# Patient Record
Sex: Male | Born: 1965 | Race: White | Hispanic: No | Marital: Married | Smoking: Never smoker
Health system: Southern US, Community
[De-identification: ages and names within clinical notes are randomized; demographics above are authoritative.]

## PROBLEM LIST (undated history)

## (undated) DIAGNOSIS — I10 Essential (primary) hypertension: Secondary | ICD-10-CM

## (undated) DIAGNOSIS — M2550 Pain in unspecified joint: Secondary | ICD-10-CM

## (undated) DIAGNOSIS — M7989 Other specified soft tissue disorders: Secondary | ICD-10-CM

## (undated) DIAGNOSIS — A630 Anogenital (venereal) warts: Secondary | ICD-10-CM

## (undated) DIAGNOSIS — I82409 Acute embolism and thrombosis of unspecified deep veins of unspecified lower extremity: Secondary | ICD-10-CM

## (undated) DIAGNOSIS — G4730 Sleep apnea, unspecified: Secondary | ICD-10-CM

## (undated) DIAGNOSIS — F32A Depression, unspecified: Secondary | ICD-10-CM

## (undated) DIAGNOSIS — K5900 Constipation, unspecified: Secondary | ICD-10-CM

## (undated) DIAGNOSIS — K219 Gastro-esophageal reflux disease without esophagitis: Secondary | ICD-10-CM

## (undated) DIAGNOSIS — F909 Attention-deficit hyperactivity disorder, unspecified type: Secondary | ICD-10-CM

## (undated) DIAGNOSIS — M549 Dorsalgia, unspecified: Secondary | ICD-10-CM

## (undated) HISTORY — DX: Attention-deficit hyperactivity disorder, unspecified type: F90.9

## (undated) HISTORY — DX: Anogenital (venereal) warts: A63.0

## (undated) HISTORY — DX: Essential (primary) hypertension: I10

## (undated) HISTORY — DX: Acute embolism and thrombosis of unspecified deep veins of unspecified lower extremity: I82.409

## (undated) HISTORY — DX: Dorsalgia, unspecified: M54.9

## (undated) HISTORY — DX: Depression, unspecified: F32.A

## (undated) HISTORY — DX: Other specified soft tissue disorders: M79.89

## (undated) HISTORY — DX: Pain in unspecified joint: M25.50

## (undated) HISTORY — DX: Sleep apnea, unspecified: G47.30

## (undated) HISTORY — PX: WISDOM TOOTH EXTRACTION: SHX21

## (undated) HISTORY — DX: Constipation, unspecified: K59.00

## (undated) HISTORY — PX: TONSILLECTOMY: SUR1361

---

## 2000-08-06 ENCOUNTER — Emergency Department (HOSPITAL_COMMUNITY): Admission: EM | Admit: 2000-08-06 | Discharge: 2000-08-06 | Payer: Self-pay | Admitting: Emergency Medicine

## 2014-06-15 ENCOUNTER — Emergency Department (INDEPENDENT_AMBULATORY_CARE_PROVIDER_SITE_OTHER)
Admission: EM | Admit: 2014-06-15 | Discharge: 2014-06-15 | Disposition: A | Discharge status: Home / Self Care | Payer: BC Managed Care – PPO | Source: Home / Self Care | Attending: Emergency Medicine | Admitting: Emergency Medicine

## 2014-06-15 ENCOUNTER — Encounter (HOSPITAL_COMMUNITY): Payer: Self-pay | Admitting: Emergency Medicine

## 2014-06-15 DIAGNOSIS — R0789 Other chest pain: Secondary | ICD-10-CM | POA: Diagnosis not present

## 2014-06-15 DIAGNOSIS — F419 Anxiety disorder, unspecified: Secondary | ICD-10-CM | POA: Diagnosis not present

## 2014-06-15 NOTE — ED Notes (Signed)
Pt states that he started having chest pain this evening walking in the park

## 2014-06-15 NOTE — ED Provider Notes (Signed)
CSN: [id number]     Arrival date & time 06/15/14  1930 History   None    Chief Complaint  Patient presents with  . Chest Pain   (Consider location/radiation/quality/duration/timing/severity/associated sxs/prior Treatment) HPI  He is a 49 year old man here for evaluation of chest pain. He states for the last 3 or 4 days he has had a slight pain in the anterior left chest wall. Today, the pain got a little bit worse as he and his partner were driving to the park. He got a little dizzy with it. He denies any diaphoresis, left arm pain, nausea. He had his has been stopped at the drug store and they bought some baby aspirin. He took 4 baby aspirin and they came here. His pain currently is at a 0-1. He reports some continued mild intermittent dizziness. He states his blood pressure was elevated at the drug store. He states he has been monitoring his blood pressure and before today, it was slowly creeping up but the highest it had been was in the 130s over 90s. He does eat a lot of salt. He does not exercise. Diabetes runs in his family, but he does not have a diagnosis of diabetes. No known history of cardiac disease. He is a nonsmoker. No alcohol use. He states he has a lot of stress at work currently.  No past medical history on file. No past surgical history on file. No family history on file. History  Substance Use Topics  . Smoking status: Not on file  . Smokeless tobacco: Not on file  . Alcohol Use: Not on file    Review of Systems As in history of present illness Allergies  Review of patient's allergies indicates not on file.  Home Medications   Prior to Admission medications   Not on File   BP 152/104 mmHg  Pulse 96  Temp(Src) 98.5 F (36.9 C) (Oral)  Resp 15  SpO2 99% Physical Exam  Constitutional: He is oriented to person, place, and time. He appears well-developed and well-nourished. No distress.  Neck: Neck supple.  Cardiovascular: Normal rate, regular rhythm and normal  heart sounds.   No murmur heard. Pulmonary/Chest: Effort normal and breath sounds normal. No respiratory distress. He has no wheezes. He has no rales. He exhibits tenderness.    Neurological: He is alert and oriented to person, place, and time.    ED Course  Procedures (including critical care time) ED ECG REPORT   Date: 06/15/2014  Rate: 95  Rhythm: normal sinus rhythm  QRS Axis: normal  Intervals: normal  ST/T Wave abnormalities: normal  Conduction Disutrbances:none  Narrative Interpretation: normal ekg  Old EKG Reviewed: none available  I have personally reviewed the EKG tracing and agree with the computerized printout as noted.  Labs Review Labs Reviewed - No data to display  Imaging Review No results found.   MDM   1. Chest wall pain   2. Anxiety    He has chest wall pain and some anxiety. Recommended ibuprofen or aspirin as needed for the chest wall pain. Recommended he decrease his salt intake and start exercising. Warning signs reviewed.    Melony Overly, MD 06/15/14 2051

## 2014-06-15 NOTE — Discharge Instructions (Signed)
You have some chest wall pain. The other symptoms he experienced are likely secondary to some anxiety and stress. Your EKG is normal. I am not concerned about a heart attack. Please work on your salt intake. Gradually start adding exercise to your daily routine. Please establish care with a primary care physician. If you develop crushing chest pain, pain going into your left arm, pain associated with nausea or sweatiness, please go to the emergency room.

## 2014-09-11 ENCOUNTER — Encounter: Payer: Self-pay | Admitting: Family Medicine

## 2014-09-11 ENCOUNTER — Ambulatory Visit (INDEPENDENT_AMBULATORY_CARE_PROVIDER_SITE_OTHER): Payer: BC Managed Care – PPO | Admitting: Family Medicine

## 2014-09-11 VITALS — BP 141/104 | HR 84 | Ht 72.5 in | Wt 232.0 lb

## 2014-09-11 DIAGNOSIS — F418 Other specified anxiety disorders: Secondary | ICD-10-CM

## 2014-09-11 DIAGNOSIS — J329 Chronic sinusitis, unspecified: Secondary | ICD-10-CM

## 2014-09-11 DIAGNOSIS — I10 Essential (primary) hypertension: Secondary | ICD-10-CM | POA: Insufficient documentation

## 2014-09-11 DIAGNOSIS — K219 Gastro-esophageal reflux disease without esophagitis: Secondary | ICD-10-CM | POA: Insufficient documentation

## 2014-09-11 DIAGNOSIS — J320 Chronic maxillary sinusitis: Secondary | ICD-10-CM | POA: Diagnosis not present

## 2014-09-11 DIAGNOSIS — R0683 Snoring: Secondary | ICD-10-CM | POA: Diagnosis not present

## 2014-09-11 HISTORY — DX: Chronic sinusitis, unspecified: J32.9

## 2014-09-11 HISTORY — DX: Essential (primary) hypertension: I10

## 2014-09-11 HISTORY — DX: Other specified anxiety disorders: F41.8

## 2014-09-11 HISTORY — DX: Snoring: R06.83

## 2014-09-11 LAB — COMPLETE METABOLIC PANEL WITH GFR
ALT: 15 U/L (ref 0–53)
AST: 13 U/L (ref 0–37)
Albumin: 4.1 g/dL (ref 3.5–5.2)
Alkaline Phosphatase: 78 U/L (ref 39–117)
BUN: 15 mg/dL (ref 6–23)
CALCIUM: 9.6 mg/dL (ref 8.4–10.5)
CO2: 28 meq/L (ref 19–32)
Chloride: 105 mEq/L (ref 96–112)
Creat: 1.02 mg/dL (ref 0.50–1.35)
GFR, Est Non African American: 87 mL/min
Glucose, Bld: 91 mg/dL (ref 70–99)
Potassium: 4.2 mEq/L (ref 3.5–5.3)
SODIUM: 143 meq/L (ref 135–145)
TOTAL PROTEIN: 6.8 g/dL (ref 6.0–8.3)
Total Bilirubin: 0.5 mg/dL (ref 0.2–1.2)

## 2014-09-11 LAB — CBC
HCT: 49.3 % (ref 39.0–52.0)
HEMOGLOBIN: 16.8 g/dL (ref 13.0–17.0)
MCH: 29.8 pg (ref 26.0–34.0)
MCHC: 34.1 g/dL (ref 30.0–36.0)
MCV: 87.4 fL (ref 78.0–100.0)
MPV: 11 fL (ref 8.6–12.4)
Platelets: 219 10*3/uL (ref 150–400)
RBC: 5.64 MIL/uL (ref 4.22–5.81)
RDW: 13.2 % (ref 11.5–15.5)
WBC: 5.8 10*3/uL (ref 4.0–10.5)

## 2014-09-11 LAB — LDL CHOLESTEROL, DIRECT: Direct LDL: 98 mg/dL

## 2014-09-11 MED ORDER — LISINOPRIL 5 MG PO TABS: 5.0000 mg | ORAL_TABLET | Freq: Every day | ORAL | Status: DC

## 2014-09-11 NOTE — Assessment & Plan Note (Signed)
Sleep study pending 

## 2014-09-11 NOTE — Assessment & Plan Note (Signed)
Trial of Rhinocort or Nasonex

## 2014-09-11 NOTE — Progress Notes (Signed)
Brendan Neal is a 49 y.o. male who presents to [facility]  today for establish care. Patient has several active issues to discuss.  1) elevated blood pressure. Patient has been checking his blood pressure at the [facility] the last several months and notes it's consistently elevated. He notes that he has gained weight recently and suspects this is the root cause. He does not take any medications for blood pressure. No chest pains palpitations or shortness of breath.  2) anxiety and depression: Patient notes lots of stress at work. He notes recently he has been feeling a little worse. In the past to receive counseling which was helpful. He denies any SI or HI. He does not want to take medications at this time.  3) nasal congestion: Patient notes chronic nasal congestion and irritation. This presents itself as episodes of sinusitis. He currently feels well. In the past he has used Flonase nasal spray which did not help much.  4) possible sleep apnea: Patient has snoring and difficulty breathing at night sometimes. His husband notes that he stops breathing for 15 seconds sometimes at night. He does not feel well rested in the morning. He denies any chest pains or shortness of breath.  5) acid reflux:  Patient notes heartburn and irritation. He takes times daily for this problem. He had an endoscopy in 2014 and was diagnosed with sounds like pill esophagitis. He denies any trouble swallowing weight loss or night sweats.   Past Medical History  Diagnosis Date  . Genital warts   . Hypertension    Past Surgical History  Procedure Laterality Date  . Tonsillectomy    . Wisdom tooth extraction     History  Substance Use Topics  . Smoking status: Never Smoker   . Smokeless tobacco: Not on file  . Alcohol Use: No   ROS as above Medications: Current Outpatient Prescriptions  Medication Sig Dispense Refill  . aspirin-acetaminophen-caffeine (EXCEDRIN MIGRAINE)  250-250-65 MG per tablet Take by mouth every 6 (six) hours as needed for headache.    . loratadine (CLARITIN) 10 MG tablet Take 10 mg by mouth daily.    Marland Kitchen lisinopril (PRINIVIL,ZESTRIL) 5 MG tablet Take 1 tablet (5 mg total) by mouth daily. 90 tablet 3   No current facility-administered medications for this visit.   No Known Allergies   Exam:  BP 141/104 mmHg  Pulse 84  Ht 6' 0.5" (1.842 m)  Wt 232 lb (105.235 kg)  BMI 31.02 kg/m2 Gen: Well NAD HEENT: EOMI,  MMM large neck. Normal posterior pharynx. Inflamed nasal turbinates bilaterally. Lungs: Normal work of breathing. CTABL Heart: RRR no MRG Abd: NABS, Soft. Nondistended, Nontender Exts: Brisk capillary refill, warm and well perfused.   No results found for this or any previous visit (from the past 24 hour(s)). No results found.   Please see individual assessment and plan sections.

## 2014-09-11 NOTE — Assessment & Plan Note (Addendum)
Will not start medicines yet. We'll check for counseling.

## 2014-09-11 NOTE — Patient Instructions (Signed)
Thank you for coming in today. Take lisinopril daily.  Return in 2 weeks for nurse visit and lab recheck.  Try Rhinocort or nasonex.  Go for sleep study.  Return for foot and ankle care.   Hypertension Hypertension, commonly called high blood pressure, is when the force of blood pumping through your arteries is too strong. Your arteries are the blood vessels that carry blood from your heart throughout your body. A blood pressure reading consists of a higher number over a lower number, such as 110/72. The higher number (systolic) is the pressure inside your arteries when your heart pumps. The lower number (diastolic) is the pressure inside your arteries when your heart relaxes. Ideally you want your blood pressure below 120/80. Hypertension forces your heart to work harder to pump blood. Your arteries may become narrow or stiff. Having hypertension puts you at risk for heart disease, stroke, and other problems.  RISK FACTORS Some risk factors for high blood pressure are controllable. Others are not.  Risk factors you cannot control include:   Race. You may be at higher risk if you are African American.  Age. Risk increases with age.  Gender. Men are at higher risk than women before age 34 years. After age 7, women are at higher risk than men. Risk factors you can control include:  Not getting enough exercise or physical activity.  Being overweight.  Getting too much fat, sugar, calories, or salt in your diet.  Drinking too much alcohol. SIGNS AND SYMPTOMS Hypertension does not usually cause signs or symptoms. Extremely high blood pressure (hypertensive crisis) may cause headache, anxiety, shortness of breath, and nosebleed. DIAGNOSIS  To check if you have hypertension, your health care provider will measure your blood pressure while you are seated, with your arm held at the level of your heart. It should be measured at least twice using the same arm. Certain conditions can cause a  difference in blood pressure between your right and left arms. A blood pressure reading that is higher than normal on one occasion does not mean that you need treatment. If one blood pressure reading is high, ask your health care provider about having it checked again. TREATMENT  Treating high blood pressure includes making lifestyle changes and possibly taking medicine. Living a healthy lifestyle can help lower high blood pressure. You may need to change some of your habits. Lifestyle changes may include:  Following the DASH diet. This diet is high in fruits, vegetables, and whole grains. It is low in salt, red meat, and added sugars.  Getting at least 2 hours of brisk physical activity every week.  Losing weight if necessary.  Not smoking.  Limiting alcoholic beverages.  Learning ways to reduce stress. If lifestyle changes are not enough to get your blood pressure under control, your health care provider may prescribe medicine. You may need to take more than one. Work closely with your health care provider to understand the risks and benefits. HOME CARE INSTRUCTIONS  Have your blood pressure rechecked as directed by your health care provider.   Take medicines only as directed by your health care provider. Follow the directions carefully. Blood pressure medicines must be taken as prescribed. The medicine does not work as well when you skip doses. Skipping doses also puts you at risk for problems.   Do not smoke.   Monitor your blood pressure at home as directed by your health care provider. SEEK MEDICAL CARE IF:   You think you are having a  reaction to medicines taken.  You have recurrent headaches or feel dizzy.  You have swelling in your ankles.  You have trouble with your vision. SEEK IMMEDIATE MEDICAL CARE IF:  You develop a severe headache or confusion.  You have unusual weakness, numbness, or feel faint.  You have severe chest or abdominal pain.  You vomit  repeatedly.  You have trouble breathing. MAKE SURE YOU:   Understand these instructions.  Will watch your condition.  Will get help right away if you are not doing well or get worse. Document Released: 02/06/2005 Document Revised: 06/23/2013 Document Reviewed: 11/29/2012 [facility] Patient Information 2015 [place], [place]. This information is not intended to replace advice given to you by your health care provider. Make sure you discuss any questions you have with your health care provider.

## 2014-09-11 NOTE — Assessment & Plan Note (Signed)
Hypertension. Start low-dose lisinopril. Check basic labs today. Sleep study for evaluation of sleep apnea

## 2014-09-11 NOTE — Assessment & Plan Note (Signed)
Patient does not want to take PPIs at this time. We'll attempt to get forms from gastroenterologist.

## 2014-09-13 NOTE — Progress Notes (Signed)
Quick Note:  Normal, no changes. ______ 

## 2014-09-15 ENCOUNTER — Telehealth: Payer: Self-pay | Admitting: Family Medicine

## 2014-09-15 NOTE — Telephone Encounter (Signed)
I called patient with a recommendation for counseling. I [facility]. I left a message. Patient will call back if he has any questions or needs a referral.

## 2014-10-02 ENCOUNTER — Ambulatory Visit (INDEPENDENT_AMBULATORY_CARE_PROVIDER_SITE_OTHER): Payer: BC Managed Care – PPO | Admitting: Family Medicine

## 2014-10-02 ENCOUNTER — Ambulatory Visit: Payer: Self-pay | Admitting: Family Medicine

## 2014-10-02 ENCOUNTER — Ambulatory Visit (INDEPENDENT_AMBULATORY_CARE_PROVIDER_SITE_OTHER): Payer: BC Managed Care – PPO

## 2014-10-02 ENCOUNTER — Telehealth: Payer: Self-pay

## 2014-10-02 ENCOUNTER — Encounter: Payer: Self-pay | Admitting: Family Medicine

## 2014-10-02 VITALS — BP 120/83 | HR 69 | Wt 227.0 lb

## 2014-10-02 DIAGNOSIS — R05 Cough: Secondary | ICD-10-CM | POA: Diagnosis not present

## 2014-10-02 DIAGNOSIS — J069 Acute upper respiratory infection, unspecified: Secondary | ICD-10-CM | POA: Diagnosis not present

## 2014-10-02 DIAGNOSIS — R0602 Shortness of breath: Secondary | ICD-10-CM

## 2014-10-02 MED ORDER — IPRATROPIUM BROMIDE 0.06 % NA SOLN: 2.0000 | Freq: Four times a day (QID) | NASAL | Status: DC

## 2014-10-02 MED ORDER — LEVOFLOXACIN 500 MG PO TABS: 500.0000 mg | ORAL_TABLET | Freq: Every day | ORAL | Status: DC

## 2014-10-02 MED ORDER — GUAIFENESIN-CODEINE 100-10 MG/5ML PO SOLN: 5.0000 mL | Freq: Every evening | ORAL | Status: DC | PRN

## 2014-10-02 MED ORDER — PREDNISONE 10 MG PO TABS: 30.0000 mg | ORAL_TABLET | Freq: Every day | ORAL | Status: DC

## 2014-10-02 NOTE — Assessment & Plan Note (Signed)
Likely viral URI. Patient has mild orthostatic changes as well. Recommend discontinuation of lisinopril for the last few days. Treat symptoms with codeine cough syrup, Atrovent nasal spray and prednisone as needed. Chest x-ray pending.

## 2014-10-02 NOTE — Progress Notes (Signed)
Quick Note:  CXR show PNA. I called in [place]. Take that instead of prednisone. Return as needed. ______

## 2014-10-02 NOTE — Progress Notes (Signed)
Brendan Neal is a 49 y.o. male who presents to [facility]  today for sneezing, postnasal drip dizziness. Symptoms present for 3 days. Patient also notes mild fatigue and body aches. He has tried some over-the-counter cough medicines and some aspirin which help a little. He feels lightheaded when he stands up. He denies any chest pains or palpitations. He denies any syncope. He feels well otherwise.   Past Medical History  Diagnosis Date  . Genital warts   . Hypertension    Past Surgical History  Procedure Laterality Date  . Tonsillectomy    . Wisdom tooth extraction     Social History  Substance Use Topics  . Smoking status: Never Smoker   . Smokeless tobacco: Not on file  . Alcohol Use: No   ROS as above Medications: Current Outpatient Prescriptions  Medication Sig Dispense Refill  . aspirin-acetaminophen-caffeine (EXCEDRIN MIGRAINE) 250-250-65 MG per tablet Take by mouth every 6 (six) hours as needed for headache.    . lisinopril (PRINIVIL,ZESTRIL) 5 MG tablet Take 1 tablet (5 mg total) by mouth daily. 90 tablet 3  . loratadine (CLARITIN) 10 MG tablet Take 10 mg by mouth daily.    Marland Kitchen guaiFENesin-codeine 100-10 MG/5ML syrup Take 5 mLs by mouth at bedtime as needed for cough. 120 mL 0  . ipratropium (ATROVENT) 0.06 % nasal spray Place 2 sprays into both nostrils 4 (four) times daily. 15 mL 1  . predniSONE (DELTASONE) 10 MG tablet Take 3 tablets (30 mg total) by mouth daily. 15 tablet 0   No current facility-administered medications for this visit.   No Known Allergies   Exam:  BP 120/83 mmHg  Pulse 69  Wt 227 lb (102.967 kg)  Orthostatic VS for the past 24 hrs:  BP- Lying Pulse- Lying BP- Sitting Pulse- Sitting BP- Standing at 0 minutes Pulse- Standing at 0 minutes  10/02/14 0911 120/83 mmHg 69 (!) 127/93 mmHg 91 125/86 mmHg 93      Gen: Well NAD HEENT: EOMI,  MMM posterior pharynx with cobblestoning. Clear nasal discharge Lungs:  Normal work of breathing. CTABL Heart: RRR no MRG Abd: NABS, Soft. Nondistended, Nontender Exts: Brisk capillary refill, warm and well perfused.   No results found for this or any previous visit (from the past 24 hour(s)). No results found.   Please see individual assessment and plan sections.

## 2014-10-02 NOTE — Addendum Note (Signed)
Addended by: Gregor Hams on: 10/02/2014 12:41 PM   Modules accepted: Orders

## 2014-10-02 NOTE — Patient Instructions (Signed)
Thank you for coming in today. STOP the lisinopril for a few days. Drink plenty of fluids.  Take 1000mg  acetaminophen (Tylenol) every 6 hours as needed for pain fatigue and malaise. Use codeine cough syrup at bedtime as needed for cough. Use Atrovent nasal spray every 4-6 hours as needed for runny nose and postnasal drip. Take prednisone in a day or 2 if not getting better.  Call or go to the emergency room if you get worse, have trouble breathing, have chest pains, or palpitations.   Return in a few weeks for repeat lab draw. This can be just a arranged lab visit. Do not need to see me.  Acute Bronchitis Bronchitis is inflammation of the airways that extend from the windpipe into the lungs (bronchi). The inflammation often causes mucus to develop. This leads to a cough, which is the most common symptom of bronchitis.  In acute bronchitis, the condition usually develops suddenly and goes away over time, usually in a couple weeks. Smoking, allergies, and asthma can make bronchitis worse. Repeated episodes of bronchitis may cause further lung problems.  CAUSES Acute bronchitis is most often caused by the same virus that causes a cold. The virus can spread from person to person (contagious) through coughing, sneezing, and touching contaminated objects. SIGNS AND SYMPTOMS   Cough.   Fever.   Coughing up mucus.   Body aches.   Chest congestion.   Chills.   Shortness of breath.   Sore throat.  DIAGNOSIS  Acute bronchitis is usually diagnosed through a physical exam. Your health care provider will also ask you questions about your medical history. Tests, such as chest X-rays, are sometimes done to rule out other conditions.  TREATMENT  Acute bronchitis usually goes away in a couple weeks. Oftentimes, no medical treatment is necessary. Medicines are sometimes given for relief of fever or cough. Antibiotic medicines are usually not needed but may be prescribed in certain  situations. In some cases, an inhaler may be recommended to help reduce shortness of breath and control the cough. A cool mist vaporizer may also be used to help thin bronchial secretions and make it easier to clear the chest.  HOME CARE INSTRUCTIONS  Get plenty of rest.   Drink enough fluids to keep your urine clear or pale yellow (unless you have a medical condition that requires fluid restriction). Increasing fluids may help thin your respiratory secretions (sputum) and reduce chest congestion, and it will prevent dehydration.   Take medicines only as directed by your health care provider.  If you were prescribed an antibiotic medicine, finish it all even if you start to feel better.  Avoid smoking and secondhand smoke. Exposure to cigarette smoke or irritating chemicals will make bronchitis worse. If you are a smoker, consider using nicotine gum or skin patches to help control withdrawal symptoms. Quitting smoking will help your lungs heal faster.   Reduce the chances of another bout of acute bronchitis by washing your hands frequently, avoiding people with cold symptoms, and trying not to touch your hands to your mouth, nose, or eyes.   Keep all follow-up visits as directed by your health care provider.  SEEK MEDICAL CARE IF: Your symptoms do not improve after 1 week of treatment.  SEEK IMMEDIATE MEDICAL CARE IF:  You develop an increased fever or chills.   You have chest pain.   You have severe shortness of breath.  You have bloody sputum.   You develop dehydration.  You faint or repeatedly feel  like you are going to pass out.  You develop repeated vomiting.  You develop a severe headache. MAKE SURE YOU:   Understand these instructions.  Will watch your condition.  Will get help right away if you are not doing well or get worse. Document Released: 03/16/2004 Document Revised: 06/23/2013 Document Reviewed: 07/30/2012 [facility] Patient Information 2015  [place], [place]. This information is not intended to replace advice given to you by your health care provider. Make sure you discuss any questions you have with your health care provider.

## 2014-10-02 NOTE — Telephone Encounter (Signed)
Pt called and is very concerned about having PNA. He want to know how serious this is? Is it bilateral? How long he should be out of work? Please advise.

## 2014-10-05 NOTE — Telephone Encounter (Signed)
The ABX we picked should work well. The pneumonia is small. He should be fine. Out of work a few days. Return when feeling better. This should be early this week.

## 2014-10-05 NOTE — Telephone Encounter (Signed)
Pt notified of recommendations

## 2014-10-09 ENCOUNTER — Telehealth: Payer: Self-pay | Admitting: Family Medicine

## 2014-10-09 NOTE — Telephone Encounter (Signed)
Patient called stating that he continues to have a cough. He's feeling much better however. I reassured him that is normal to have a postviral cough. Restart lisinopril. Return if not better.

## 2014-10-14 ENCOUNTER — Encounter: Payer: Self-pay | Admitting: Family Medicine

## 2014-10-14 ENCOUNTER — Ambulatory Visit (INDEPENDENT_AMBULATORY_CARE_PROVIDER_SITE_OTHER): Payer: BC Managed Care – PPO | Admitting: Family Medicine

## 2014-10-14 VITALS — BP 127/92 | HR 96 | Temp 98.2°F | Wt 231.0 lb

## 2014-10-14 DIAGNOSIS — R058 Other specified cough: Secondary | ICD-10-CM | POA: Insufficient documentation

## 2014-10-14 DIAGNOSIS — R05 Cough: Secondary | ICD-10-CM

## 2014-10-14 MED ORDER — BENZONATATE 200 MG PO CAPS: 200.0000 mg | ORAL_CAPSULE | Freq: Three times a day (TID) | ORAL | Status: DC | PRN

## 2014-10-14 NOTE — Progress Notes (Signed)
Brendan Neal is a 49 y.o. male who presents to [facility]  today for cough. Patient was recently treated for pneumonia with Levaquin. He notes he is feeling much better but notes a persistent mildly productive cough. Additionally he notes a postnasal drip and congestion. He denies any current fevers chills nausea vomiting or diarrhea. He is concerned that he may be developing pneumonia again.   Past Medical History  Diagnosis Date  . Genital warts   . Hypertension    Past Surgical History  Procedure Laterality Date  . Tonsillectomy    . Wisdom tooth extraction     Social History  Substance Use Topics  . Smoking status: Never Smoker   . Smokeless tobacco: Not on file  . Alcohol Use: No   family history includes Alcohol abuse in his brother; Depression in his brother; Diabetes in his maternal aunt.  ROS as above Medications: Current Outpatient Prescriptions  Medication Sig Dispense Refill  . aspirin-acetaminophen-caffeine (EXCEDRIN MIGRAINE) 250-250-65 MG per tablet Take by mouth every 6 (six) hours as needed for headache.    . GuaiFENesin (MUCINEX PO) Take by mouth.    Marland Kitchen ipratropium (ATROVENT) 0.06 % nasal spray Place 2 sprays into both nostrils 4 (four) times daily. 15 mL 1  . lisinopril (PRINIVIL,ZESTRIL) 5 MG tablet Take 1 tablet (5 mg total) by mouth daily. 90 tablet 3  . loratadine (CLARITIN) 10 MG tablet Take 10 mg by mouth daily.    . benzonatate (TESSALON) 200 MG capsule Take 1 capsule (200 mg total) by mouth 3 (three) times daily as needed for cough. 90 capsule 1  . guaiFENesin-codeine 100-10 MG/5ML syrup Take 5 mLs by mouth at bedtime as needed for cough. (Patient not taking: Reported on 10/14/2014) 120 mL 0   No current facility-administered medications for this visit.   No Known Allergies   Exam:  BP 127/92 mmHg  Pulse 96  Temp(Src) 98.2 F (36.8 C) (Oral)  Wt 231 lb (104.781 kg)  SpO2 96% Gen: Well NAD HEENT: EOMI,  MMM  posterior pharynx with cobblestoning. Normal tympanic membranes bilaterally. Sinuses are nontender to percussion bilaterally. No cervical lymphadenopathy present Lungs: Normal work of breathing. CTABL Heart: RRR no MRG Abd: NABS, Soft. Nondistended, Nontender Exts: Brisk capillary refill, warm and well perfused.   No results found for this or any previous visit (from the past 24 hour(s)). No results found.   Please see individual assessment and plan sections.

## 2014-10-14 NOTE — Patient Instructions (Signed)
Thank you for coming in today. Call or go to the emergency room if you get worse, have trouble breathing, have chest pains, or palpitations.  Use tessalon for cough as needed.  Take it easy if you can.

## 2014-10-14 NOTE — Assessment & Plan Note (Signed)
Symptoms most consistent with post viral or post pneumonia cough. I explained this is a normal finding and will use Tessalon Perles for symptom control as needed. Additionally we'll treat sinus drainage with any histamines and Atrovent nasal spray. Return if not improving or worsening.

## 2014-12-08 ENCOUNTER — Ambulatory Visit (HOSPITAL_BASED_OUTPATIENT_CLINIC_OR_DEPARTMENT_OTHER): Payer: BC Managed Care – PPO | Attending: Family Medicine

## 2015-02-17 ENCOUNTER — Ambulatory Visit (INDEPENDENT_AMBULATORY_CARE_PROVIDER_SITE_OTHER): Payer: BC Managed Care – PPO | Admitting: Family Medicine

## 2015-02-17 ENCOUNTER — Encounter: Payer: Self-pay | Admitting: Family Medicine

## 2015-02-17 VITALS — BP 130/86 | HR 97 | Wt 232.0 lb

## 2015-02-17 DIAGNOSIS — F411 Generalized anxiety disorder: Secondary | ICD-10-CM | POA: Diagnosis not present

## 2015-02-17 DIAGNOSIS — I10 Essential (primary) hypertension: Secondary | ICD-10-CM

## 2015-02-17 DIAGNOSIS — F418 Other specified anxiety disorders: Secondary | ICD-10-CM | POA: Diagnosis not present

## 2015-02-17 DIAGNOSIS — K219 Gastro-esophageal reflux disease without esophagitis: Secondary | ICD-10-CM

## 2015-02-17 HISTORY — DX: Generalized anxiety disorder: F41.1

## 2015-02-17 MED ORDER — RANITIDINE HCL 300 MG PO TABS: 300.0000 mg | ORAL_TABLET | Freq: Two times a day (BID) | ORAL | Status: DC

## 2015-02-17 MED ORDER — HYDROCHLOROTHIAZIDE 12.5 MG PO TABS: 12.5000 mg | ORAL_TABLET | Freq: Every day | ORAL | Status: DC

## 2015-02-17 NOTE — Assessment & Plan Note (Signed)
Chronic, worsening. Tums not helping. Failed PPI treatment in past due to side effects. Will prescribe ranitidine 300 per patient request for different class of medications.

## 2015-02-17 NOTE — Assessment & Plan Note (Signed)
Mild given GAD7. Will refer to CBT. Will defer medication at this time per patient request.

## 2015-02-17 NOTE — Progress Notes (Signed)
       Brendan Neal is a 49 y.o. male who presents to [facility] today for concerns about BP medications, reflux, and anxiety.  1) Anxiety: Patient is under a tremendous amount of stress at work. He endorses blurry vision, dizziness and feelings of being "out of his body", which he has attributed to his medications. He says he feels "crazy and anxious" all the time and has trouble prioritizing tasks at work due to the stress. In the past, he tried CBT with particular success with exercise and medication. At this point, it is interfering with his work and social life and he would like further treatment. He is not interested in medications at this time but would like to return to therapy.   2) GERD: Patient has history of esophageal ulcer with few times weekly burning sensation at night after eating and lying down. He notes tums are not working anymore. He tried prilosec in the past which made him feel drowsy and "woozy" like he was "out of his body". He would like to try a different class of medications.   3) BP medications: Patient endorses drowsiness, continued dry cough and dizziness while taking lisinopril. Patient has measured his blood pressure during the dizziness and it has been in the 140s over 90s range. He would like to change medications and would not like to try an ARB. He would prefer a diuretic.   Past Medical History  Diagnosis Date  . Genital warts   . Hypertension    Past Surgical History  Procedure Laterality Date  . Tonsillectomy    . Wisdom tooth extraction     Social History  Substance Use Topics  . Smoking status: Never Smoker   . Smokeless tobacco: Not on file  . Alcohol Use: No   family history includes Alcohol abuse in his brother; Depression in his brother; Diabetes in his maternal aunt.  ROS as above Medications: Current Outpatient Prescriptions  Medication Sig  Dispense Refill  . Calcium Carbonate Antacid (TUMS PO) Take by mouth.    Neta Ehlers SALINE NASAL SPRAY NA Place into the nose.    . loratadine (CLARITIN) 10 MG tablet Take 10 mg by mouth daily.    . hydrochlorothiazide (HYDRODIURIL) 12.5 MG tablet Take 1 tablet (12.5 mg total) by mouth daily. 30 tablet 0  . ranitidine (ZANTAC) 300 MG tablet Take 1 tablet (300 mg total) by mouth 2 (two) times daily. 180 tablet 3   No current facility-administered medications for this visit.   No Known Allergies   Exam:  BP 130/86 mmHg  Pulse 97  Wt 232 lb (105.235 kg) Gen: Well appearing gentleman in NAD HEENT: EOMI,  MMM Lungs: Normal work of breathing. CTABL no wheezes or crackles  Heart: RRR no MRG Abd: NABS, Soft. Nondistended, Nontender Exts: Brisk capillary refill, warm and well perfused.  Psych: Pressured speech, fidgety, Mood: feels "crazy and anxious" all the time and "sometimes depressed"   No results found for this or any previous visit (from the past 24 hour(s)). No results found.  PHQ9: 4 GAD7: 5  Please see individual assessment and plan sections.

## 2015-02-17 NOTE — Patient Instructions (Signed)
Thank you for coming in today. You were seen today for your hypertension, anxiety, and gastroesophageal reflux disease.  We will change your blood medication to HCTZ. We will prescribe ranitidine for reflux.  Please come back in 1 month for a blood pressure check and bloodwork to assess your kidney function.   [address] [place], [place] [address]  ???PHONE: [phone] FAX: [phone] EMAIL: [email or web address]   Food Choices for Gastroesophageal Reflux Disease, Adult When you have gastroesophageal reflux disease (GERD), the foods you eat and your eating habits are very important. Choosing the right foods can help ease the discomfort of GERD. WHAT GENERAL GUIDELINES DO I NEED TO FOLLOW?  Choose fruits, vegetables, whole grains, low-fat dairy products, and low-fat meat, fish, and poultry.  Limit fats such as oils, salad dressings, butter, nuts, and avocado.  Keep a food diary to identify foods that cause symptoms.  Avoid foods that cause reflux. These may be different for different people.  Eat frequent small meals instead of three large meals each day.  Eat your meals slowly, in a relaxed setting.  Limit fried foods.  Cook foods using methods other than frying.  Avoid drinking alcohol.  Avoid drinking large amounts of liquids with your meals.  Avoid bending over or lying down until 2-3 hours after eating. WHAT FOODS ARE NOT RECOMMENDED? The following are some foods and drinks that may worsen your symptoms: Vegetables Tomatoes. Tomato juice. Tomato and spaghetti sauce. Chili peppers. Onion and garlic. Horseradish. Fruits Oranges, grapefruit, and lemon (fruit and juice). Meats High-fat meats, fish, and poultry. This includes hot dogs, ribs, ham, sausage, salami, and bacon. Dairy Whole milk and chocolate milk. Sour cream. Cream. Butter. Ice cream. Cream cheese.  Beverages Coffee and tea, with or without caffeine. Carbonated beverages or energy  drinks. Condiments Hot sauce. Barbecue sauce.  Sweets/Desserts Chocolate and cocoa. Donuts. Peppermint and spearmint. Fats and Oils High-fat foods, including [place] fries and potato chips. Other Vinegar. Strong spices, such as black pepper, white pepper, red pepper, cayenne, curry powder, cloves, ginger, and chili powder. The items listed above may not be a complete list of foods and beverages to avoid. Contact your dietitian for more information.   This information is not intended to replace advice given to you by your health care provider. Make sure you discuss any questions you have with your health care provider.   Document Released: 02/06/2005 Document Revised: 02/27/2014 Document Reviewed: 12/11/2012 Elsevier Interactive Patient Education [employer].

## 2015-02-17 NOTE — Assessment & Plan Note (Addendum)
Currently well controlled, though not tolerating medication. Will prescribe HCTZ 12.5 mg per patient request for different medication class. Interested in trying Norvasc if he fails this.  Recheck in one month

## 2015-03-19 ENCOUNTER — Other Ambulatory Visit: Payer: Self-pay | Admitting: Family Medicine

## 2015-04-02 ENCOUNTER — Encounter: Payer: Self-pay | Admitting: Family Medicine

## 2015-04-02 ENCOUNTER — Ambulatory Visit (INDEPENDENT_AMBULATORY_CARE_PROVIDER_SITE_OTHER): Payer: BC Managed Care – PPO | Admitting: Family Medicine

## 2015-04-02 VITALS — BP 126/89 | HR 96 | Temp 98.5°F | Wt 235.0 lb

## 2015-04-02 DIAGNOSIS — IMO0001 Reserved for inherently not codable concepts without codable children: Secondary | ICD-10-CM

## 2015-04-02 DIAGNOSIS — R110 Nausea: Secondary | ICD-10-CM | POA: Diagnosis not present

## 2015-04-02 DIAGNOSIS — G43009 Migraine without aura, not intractable, without status migrainosus: Secondary | ICD-10-CM

## 2015-04-02 DIAGNOSIS — R030 Elevated blood-pressure reading, without diagnosis of hypertension: Secondary | ICD-10-CM | POA: Diagnosis not present

## 2015-04-02 MED ORDER — PROMETHAZINE HCL 25 MG/ML IJ SOLN
25.0000 mg | Freq: Once | INTRAMUSCULAR | Status: AC
  Administered 2015-04-02: 25 mg via INTRAMUSCULAR

## 2015-04-02 MED ORDER — RIZATRIPTAN BENZOATE 5 MG PO TBDP: 5.0000 mg | ORAL_TABLET | ORAL | Status: DC | PRN

## 2015-04-02 MED ORDER — KETOROLAC TROMETHAMINE 60 MG/2ML IM SOLN
60.0000 mg | Freq: Once | INTRAMUSCULAR | Status: AC
  Administered 2015-04-02: 60 mg via INTRAMUSCULAR

## 2015-04-02 NOTE — Patient Instructions (Signed)

## 2015-04-02 NOTE — Progress Notes (Signed)
Subjective:    Patient ID: Brendan Neal, male    DOB: 1965/07/31, 50 y.o.   MRN: [id number]  HPI pt reports that when he got up this morning he has felt off balance he reports a lot of pressure behind his L eye he feels tired, sluggish he vomited 1 x today took. He has had this similar pain before around the eyes and temple area. He came in today because when he went home he checked his blood pressure it was in the 150's/115.  He had taken his blood pressure pill this morning and actually took a second dose later when his blood pressure was high. He reports his bp meds have recently been changed from lisinopril to hctz due to the lisinopril causing dizziness, about a month ago.   He feels nauseated with the pressure behind his eye. Says the pressure is worse when he bends over and throbs.  He reports that he gets similar headaches when the barometric pressure changes..  Had to leave work early because of the pain. He says sometimes these headaches are severe enough that he does have to leave work. He rates his pain as 7 or 8 out of 10 today. He does vomit with the head pressure occ. He took 20mg  of claritin.  Pain can last 4 hour to 3 days. He denies any light or sound sensitivity. He denies any aura or prewarning symptoms for these types of headaches. He tends to get these headaches every 2-3 months.   Review of Systems     Objective:   Physical Exam  Constitutional: He is oriented to person, place, and time. He appears well-developed and well-nourished.  HENT:  Head: Normocephalic and atraumatic.  Right Ear: External ear normal.  Left Ear: External ear normal.  Nose: Nose normal.  Mouth/Throat: Oropharynx is clear and moist.  TMs and canals are clear.   Eyes: Conjunctivae and EOM are normal. Pupils are equal, round, and reactive to light.  Neck: Normal range of motion. Neck supple. No thyromegaly present.  Cardiovascular: Normal rate, regular rhythm, normal heart sounds and intact distal  pulses.   Pulmonary/Chest: Effort normal and breath sounds normal.  Musculoskeletal: Normal range of motion.  Lymphadenopathy:    He has no cervical adenopathy.  Neurological: He is alert and oriented to person, place, and time. He has normal reflexes.  Skin: Skin is warm and dry.  Psychiatric: He has a normal mood and affect. His behavior is normal. Judgment and thought content normal.          Assessment & Plan:  Migraine headache without aura - based on his description of recurrent headaches that last hours to days and typically are unilateral, there occasionally bilateral and more severe that he has to leave work accompanied with occasional nausea and vomiting he meets criteria for migraine headaches. I discussed that for some people weather changes can be a major trigger. I did give him a handout today with some other additional information including a list of potential food triggers etc. We discussed acute treatment with Toradol and Phenergan injection here in the office. He did start get some relief before he left. We also discussed a trial of Maxalt melt in case he starts to get another headache. I did encourage him to follow-up with Dr. Georgina Snell in a couple months to let him know how he's doing and so they can adjust the medication if needed.  Elevated blood pressure-after he laid down for a few minutes after the  Toradol and Phenergan injection and started to get some pain relief his blood pressure came down beautifully. He reports that his blood pressures have been in the low 130s on the HCTZ at home over the last month. Again encouraged him to follow-up with Dr. Georgina Snell in a couple months just to adjust the medication if needed but right now it looks great.

## 2015-04-17 ENCOUNTER — Other Ambulatory Visit: Payer: Self-pay | Admitting: Family Medicine

## 2015-05-14 ENCOUNTER — Other Ambulatory Visit: Payer: Self-pay | Admitting: Family Medicine

## 2015-06-14 ENCOUNTER — Other Ambulatory Visit: Payer: Self-pay | Admitting: Family Medicine

## 2015-07-02 ENCOUNTER — Ambulatory Visit (INDEPENDENT_AMBULATORY_CARE_PROVIDER_SITE_OTHER): Payer: BC Managed Care – PPO | Admitting: Sports Medicine

## 2015-07-02 ENCOUNTER — Encounter: Payer: Self-pay | Admitting: Sports Medicine

## 2015-07-02 VITALS — BP 130/92 | HR 101 | Temp 98.0°F | Resp 18 | Wt 239.2 lb

## 2015-07-02 DIAGNOSIS — L219 Seborrheic dermatitis, unspecified: Secondary | ICD-10-CM | POA: Diagnosis not present

## 2015-07-02 DIAGNOSIS — J320 Chronic maxillary sinusitis: Secondary | ICD-10-CM

## 2015-07-02 HISTORY — DX: Seborrheic dermatitis, unspecified: L21.9

## 2015-07-02 MED ORDER — AZITHROMYCIN 250 MG PO TABS: ORAL_TABLET | ORAL | Status: DC

## 2015-07-02 MED ORDER — TRIAMCINOLONE ACETONIDE 0.5 % EX CREA: 1.0000 "application " | TOPICAL_CREAM | Freq: Two times a day (BID) | CUTANEOUS | Status: DC

## 2015-07-02 NOTE — Assessment & Plan Note (Signed)
Left nasolabial fold, triamcinolone cream

## 2015-07-02 NOTE — Assessment & Plan Note (Signed)
Recurrence of acute maxillary sinusitis, azithromycin, Flonase.

## 2015-07-02 NOTE — Patient Instructions (Signed)
Seborrheic Dermatitis Seborrheic dermatitis involves pink or red skin with greasy, flaky scales. It usually occurs on the scalp, and it is often called dandruff. This condition may also affect the eyebrows, nose, ears, chest, and the bearded area of men's faces. It often occurs where skin has more oil (sebaceous) glands. It may come and go for no known reason, and it is often long-lasting (chronic). CAUSES The cause is not known. RISK FACTORS This condition is more like to develop in:  People who are stressed or tired.  People who have skin conditions, such as acne.  People who have certain conditions, such as:  HIV (human immunodeficiency virus).  AIDS (acquired immunodeficiency syndrome).  Parkinson disease.  An eating disorder.  Stroke.  Depression.  Epilepsy.  Alcoholism.  People who live in places that have extreme weather.  People who have a family history of seborrheic dermatitis.  People who use skin creams that are made with alcohol.  People who are 30-60 years old.  People who take certain medicines. SYMPTOMS Symptoms of this condition include:  Thick scales on the scalp.  Redness on the face or in the armpits.  Skin that is flaky. The flakes may be white or yellow.  Skin that seems oily or dry but is not helped with moisturizers.  Itching or burning in the affected areas. DIAGNOSIS This condition is diagnosed with a medical history and physical exam. A sample of your skin may be tested (skin biopsy). You may need to see a skin specialist (dermatologist). TREATMENT There is no cure for this condition, but treatment can help to manage the symptoms. Treatment may include:  Cortisone (steroid) ointments, creams, and lotions.  Over-the-counter or prescription shampoos. HOME CARE INSTRUCTIONS  Apply over-the-counter and prescription medicines only as told by your health care provider.  Keep all follow-up visits as told by your health care provider.  This is important.  Try to reduce your stress, such as with yoga or mediation. If you need help to reduce stress, ask your health care provider.  Shower or bathe as told by your health care provider.  Use any medicated shampoos as told by your health care provider. SEEK MEDICAL CARE IF:  Your symptoms do not improve with treatment.  Your symptoms get worse.  You have new symptoms.   This information is not intended to replace advice given to you by your health care provider. Make sure you discuss any questions you have with your health care provider.   Document Released: 02/06/2005 Document Revised: 10/28/2014 Document Reviewed: 06/24/2014 Elsevier Interactive Patient Education 2016 Elsevier Inc.  

## 2015-07-02 NOTE — Progress Notes (Signed)
  Subjective:    CC: Feeling sick  HPI: For over 1 week his pleasant 50 year old male has had increasing pain and pressure over his sinuses, sore throat, mild cough, more recently it is progressed to yellowish nasal discharge with severe sinus pain and pressure. Symptoms are moderate, persistent.  Skin rash: Localized on the left nasolabial fold, present for years and present in all of his family members.  Past medical history, Surgical history, Family history not pertinant except as noted below, Social history, Allergies, and medications have been entered into the medical record, reviewed, and no changes needed.   Review of Systems: No fevers, chills, night sweats, weight loss, chest pain, or shortness of breath.   Objective:    General: Well Developed, well nourished, and in no acute distress.  Neuro: Alert and oriented x3, extra-ocular muscles intact, sensation grossly intact.  HEENT: Normocephalic, atraumatic, pupils equal round reactive to light, neck supple, no masses, no lymphadenopathy, thyroid nonpalpable. Scaly erythematous plaque in the left nasolabial fold, tender to palpation over the maxillary sinuses, oropharynx, nasopharynx, ear canals unremarkable. Skin: Warm and dry, no rashes. Cardiac: Regular rate and rhythm, no murmurs rubs or gallops, no lower extremity edema.  Respiratory: Clear to auscultation bilaterally. Not using accessory muscles, speaking in full sentences.  Impression and Recommendations:    I spent 25 minutes with this patient, greater than 50% was face-to-face time counseling regarding the above diagnoses

## 2015-07-08 ENCOUNTER — Encounter: Payer: Self-pay | Admitting: Family Medicine

## 2015-07-08 ENCOUNTER — Ambulatory Visit (INDEPENDENT_AMBULATORY_CARE_PROVIDER_SITE_OTHER): Payer: BC Managed Care – PPO | Admitting: Family Medicine

## 2015-07-08 VITALS — BP 138/94 | HR 73 | Temp 98.6°F | Wt 235.0 lb

## 2015-07-08 DIAGNOSIS — J320 Chronic maxillary sinusitis: Secondary | ICD-10-CM

## 2015-07-08 DIAGNOSIS — M5481 Occipital neuralgia: Secondary | ICD-10-CM | POA: Insufficient documentation

## 2015-07-08 HISTORY — DX: Occipital neuralgia: M54.81

## 2015-07-08 NOTE — Patient Instructions (Signed)
Thank you for coming in today. Follow up with physical therapy. Let me know if you need prednisone and antibiotics.

## 2015-07-08 NOTE — Assessment & Plan Note (Signed)
Symptoms either related to resolving viral URI or continuation of chronic sinus irritation. Plan for watchful waiting if not better will use Omnicef and prednisone.

## 2015-07-08 NOTE — Progress Notes (Signed)
       Brendan Neal is a 50 y.o. male who presents to [facility] today for Posterior neck pain Patient has pain in posterior neck radiating to the scalp.  Pain is worse with activity and motion. The symptoms started after a flight to [facility] and back. Additionally he was recently seen for sinus infection and treated with azithromycin. He notes he is improved but notes ear congestion and pressure. No fevers chills nausea vomiting or diarrhea. No radiating pain weakness or numbness aside from to the scalp.    Past Medical History  Diagnosis Date  . Genital warts   . Hypertension    Past Surgical History  Procedure Laterality Date  . Tonsillectomy    . Wisdom tooth extraction     Social History  Substance Use Topics  . Smoking status: Never Smoker   . Smokeless tobacco: Not on file  . Alcohol Use: No   family history includes Alcohol abuse in his brother; Depression in his brother; Diabetes in his maternal aunt.  ROS as above Medications: Current Outpatient Prescriptions  Medication Sig Dispense Refill  . azithromycin (ZITHROMAX Z-PAK) 250 MG tablet Take 2 tablets (500 mg) on  Day 1,  followed by 1 tablet (250 mg) once daily on Days 2 through 5. 6 tablet 0  . Calcium Carbonate Antacid (TUMS PO) Take by mouth.    Neta Ehlers SALINE NASAL SPRAY NA Place into the nose.    . hydrochlorothiazide (HYDRODIURIL) 12.5 MG tablet TAKE 1 TABLET(12.5 MG) BY MOUTH DAILY 30 tablet 0  . loratadine (CLARITIN) 10 MG tablet Take 10 mg by mouth daily.    . ranitidine (ZANTAC) 300 MG tablet Take 1 tablet (300 mg total) by mouth 2 (two) times daily. 180 tablet 3  . rizatriptan (MAXALT-MLT) 5 MG disintegrating tablet Take 1 tablet (5 mg total) by mouth as needed for migraine. May repeat in 2 hours if needed 10 tablet 0  . triamcinolone cream (KENALOG) 0.5 % Apply 1 application topically 2 (two) times daily. To  affected areas. 30 g 3   No current facility-administered medications for this visit.   No Known Allergies   Exam:  BP 138/94 mmHg  Pulse 73  Temp(Src) 98.6 F (37 C) (Oral)  Wt 235 lb (106.595 kg)  SpO2 97% Gen: Well NAD HEENT: EOMI,  MMM Normal tympanic membranes and posterior pharynx  Lungs: Normal work of breathing. CTABL Heart: RRR no MRG Abd: NABS, Soft. Nondistended, Nontender Exts: Brisk capillary refill, warm and well perfused.  Neck nontender to midline. Tender palpation left cervical paraspinal. Normal neck motion of upper extremity strength is equal and normal throughout.   No results found for this or any previous visit (from the past 24 hour(s)). No results found.   Please see individual assessment and plan sections.

## 2015-07-08 NOTE — Assessment & Plan Note (Signed)
Plan physical therapy. Will use prednisone if not better.

## 2015-07-13 ENCOUNTER — Other Ambulatory Visit: Payer: Self-pay | Admitting: Family Medicine

## 2015-07-14 ENCOUNTER — Ambulatory Visit: Payer: BC Managed Care – PPO | Attending: Family Medicine | Admitting: Physical Therapy

## 2015-07-14 ENCOUNTER — Encounter: Payer: Self-pay | Admitting: Physical Therapy

## 2015-07-14 DIAGNOSIS — M542 Cervicalgia: Secondary | ICD-10-CM

## 2015-07-14 DIAGNOSIS — M62838 Other muscle spasm: Secondary | ICD-10-CM | POA: Insufficient documentation
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
## 2015-07-26 ENCOUNTER — Ambulatory Visit: Payer: BC Managed Care – PPO | Attending: Family Medicine | Admitting: Physical Therapy

## 2015-07-26 DIAGNOSIS — M62838 Other muscle spasm: Secondary | ICD-10-CM | POA: Insufficient documentation

## 2015-07-26 DIAGNOSIS — M542 Cervicalgia: Secondary | ICD-10-CM | POA: Insufficient documentation
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
## 2015-07-28 ENCOUNTER — Ambulatory Visit: Payer: BC Managed Care – PPO | Admitting: Physical Therapy

## 2015-07-28 DIAGNOSIS — M542 Cervicalgia: Secondary | ICD-10-CM

## 2015-07-28 DIAGNOSIS — M62838 Other muscle spasm: Secondary | ICD-10-CM
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
## 2015-08-02 ENCOUNTER — Ambulatory Visit: Payer: BC Managed Care – PPO | Admitting: Physical Therapy

## 2015-08-02 DIAGNOSIS — M542 Cervicalgia: Secondary | ICD-10-CM | POA: Diagnosis not present

## 2015-08-02 DIAGNOSIS — M62838 Other muscle spasm: Secondary | ICD-10-CM

## 2015-08-02 NOTE — Therapy (Signed)
[place], [place], [address] Phone: [phone]   Fax:  [phone]  Physical Therapy Treatment  Patient Details  Name: Brendan Neal MRN: [id number] Date of Birth: 02-21-1966 Referring Provider: Dr Lynne Leader   Encounter Date: 08/02/2015      PT End of Session - 08/02/15 0805    Visit Number 4   Number of Visits 16   Date for PT Re-Evaluation 09/08/15   Authorization Type BCBS State    Authorization Time Period FOTO at visit 6-8   PT Start Time 0803   PT Stop Time 0900   PT Time Calculation (min) 57 min      Past Medical History  Diagnosis Date  . Genital warts   . Hypertension     Past Surgical History  Procedure Laterality Date  . Tonsillectomy    . Wisdom tooth extraction      There were no vitals filed for this visit.      Subjective Assessment - 08/02/15 0804    Subjective Things are improving.    Currently in Pain? Yes   Pain Score 1    Pain Location Neck   Aggravating Factors  fast turns, when driving.    Pain Relieving Factors positioning             [facility] PT Assessment - 08/02/15 0001    AROM   Cervical - Right Rotation 75   Cervical - Left Rotation 85                     [facility] Adult PT Treatment/Exercise - 08/02/15 0001    Neck Exercises: Theraband   Scapula Retraction Limitations green 2x10    Shoulder Extension Limitations Green 2x10   Other Theraband Exercises Bilateral external rotation red 2x10   Neck Exercises: Standing   Other Standing Exercises horizontal abduction 2x10 green   Moist Heat Therapy   Number Minutes Moist Heat 10 Minutes   Moist Heat Location Cervical  supine with bolster under knees.   Manual Therapy   Manual therapy comments suboccipital release, manual trigger point release to upper traps and cervical paraspinals to decrease mucle spasm and increase range of motion.   Neck Exercises: Stretches   Upper Trapezius Stretch 30  seconds;3 reps   Levator Stretch 3 reps;30 seconds   Other Neck Stretches scalenes stretch 2 x 30 sec bilateral                  PT Short Term Goals - 08/02/15 9924    PT SHORT TERM GOAL #1   Title Pt will increase right cervical rotation by 10 degrees    Time 4   Period Weeks   Status Achieved   PT SHORT TERM GOAL #2   Title Patient will report no pain into the forehead and scalp    Baseline much less   Time 4   Period Weeks   Status Partially Met   PT SHORT TERM GOAL #3   Title Patient will be I w/ HEP for cervical mobility and posture    Time 4   Period Weeks   Status Achieved           PT Long Term Goals - 07/14/15 1134    PT LONG TERM GOAL #1   Title Patient will return to the gym with a program to promote posture and decrease stress to the neck   Baseline unable to go to the gym 2nd  to pain    Time 8   Period Weeks   Status New   PT LONG TERM GOAL #2   Title Patient will sit at work for 1 hour without a significant increase in pain    Baseline diffciulty sitting for more then 1/2 hour    Time 8   Period Weeks   PT LONG TERM GOAL #3   Title Patient will sleep thorugh the night without increased pain    Baseline difficulty finding a comfortable postion    Time 8   Period Weeks   Status New   PT LONG TERM GOAL #4   Title Patient will demsotrate a 39% defecit on FOTO   Baseline 68%   Time 8   Period Weeks   Status New               Plan - 08/02/15 8208    Clinical Impression Statement Pt demonstrates improved right cervical rotation, he is independent with initial HEP and he reports significant decrease in pain to scalp and forehead. Instructed pt in green theraband shouler and scap strengthening with pt reporting muscle burning due to weakness however no radicular sx. Manual repeated as previous with pt reporting he no longer feels shooting pain into scalp and forehead when palpating sub occipitals. Pt plans to try retrun to swimming this  week due to increased cervical rotation.    PT Next Visit Plan continue with light manual therapy. If patient can tolerate progress to mechanical traction. Add in postural exercises. Patient would like an eventaul progression to a gym program. Monitor facial symptoms.; Did pt try swimming.       Patient will benefit from skilled therapeutic intervention in order to improve the following deficits and impairments:  Decreased mobility, Decreased activity tolerance, Decreased range of motion, Pain, Increased muscle spasms, Decreased endurance, Decreased strength, Impaired sensation  Visit Diagnosis: Cervicalgia  Other muscle spasm     Problem List Patient Active Problem List   Diagnosis Date Noted  . Cervico-occipital neuralgia of left side 07/08/2015  . Seborrheic dermatitis 07/02/2015  . Generalized anxiety disorder 02/17/2015  . HTN (hypertension) 09/11/2014  . Snores 09/11/2014  . Situational anxiety 09/11/2014  . Sinusitis, chronic 09/11/2014  . GERD (gastroesophageal reflux disease) 09/11/2014    Dorene Ar, PTA 08/02/2015, 9:32 AM  [facility] [address] [place], [place], [address] Phone: [phone]   Fax:  [phone]  Name: Brendan Neal MRN: [id number] Date of Birth: 1965-07-14

## 2015-08-04 ENCOUNTER — Ambulatory Visit: Payer: BC Managed Care – PPO | Admitting: Physical Therapy

## 2015-08-04 DIAGNOSIS — M542 Cervicalgia: Secondary | ICD-10-CM | POA: Diagnosis not present

## 2015-08-04 DIAGNOSIS — M62838 Other muscle spasm: Secondary | ICD-10-CM
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
## 2015-08-09 ENCOUNTER — Ambulatory Visit: Payer: BC Managed Care – PPO | Admitting: Physical Therapy

## 2015-08-09 DIAGNOSIS — M542 Cervicalgia: Secondary | ICD-10-CM

## 2015-08-09 DIAGNOSIS — M62838 Other muscle spasm: Secondary | ICD-10-CM
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
## 2015-08-11 ENCOUNTER — Other Ambulatory Visit: Payer: Self-pay | Admitting: Family Medicine

## 2015-08-11 ENCOUNTER — Ambulatory Visit: Payer: BC Managed Care – PPO | Admitting: Physical Therapy

## 2015-08-11 DIAGNOSIS — M542 Cervicalgia: Secondary | ICD-10-CM | POA: Diagnosis not present

## 2015-08-11 DIAGNOSIS — M62838 Other muscle spasm: Secondary | ICD-10-CM
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
## 2015-08-16 ENCOUNTER — Ambulatory Visit: Payer: BC Managed Care – PPO | Admitting: Physical Therapy

## 2015-08-16 DIAGNOSIS — M542 Cervicalgia: Secondary | ICD-10-CM

## 2015-08-16 DIAGNOSIS — M62838 Other muscle spasm: Secondary | ICD-10-CM

## 2015-08-16 NOTE — Therapy (Signed)
[place], [place], [address] Phone: [phone]   Fax:  [phone]  Physical Therapy Treatment  Patient Details  Name: Brendan Neal MRN: [id number] Date of Birth: 15-Mar-1965 Referring Provider: Dr Lynne Leader   Encounter Date: 08/16/2015      PT End of Session - 08/16/15 0804    Visit Number 8   Number of Visits 16   Date for PT Re-Evaluation 09/08/15   Authorization Type BCBS State    Authorization Time Period FOTO at visit 6-8   PT Start Time 0800   PT Stop Time 0900   PT Time Calculation (min) 60 min      Past Medical History  Diagnosis Date  . Genital warts   . Hypertension     Past Surgical History  Procedure Laterality Date  . Tonsillectomy    . Wisdom tooth extraction      There were no vitals filed for this visit.      Subjective Assessment - 08/16/15 0803    Subjective 9 or 10 laps swimming yesterday   Currently in Pain? No/denies            [facility] PT Assessment - 08/16/15 0001    Observation/Other Assessments   Focus on Therapeutic Outcomes (FOTO)  34% limited   AROM   Cervical - Right Side Bend 40   Cervical - Left Side Bend 35   Cervical - Right Rotation 70   Cervical - Left Rotation 80                     [facility] Adult PT Treatment/Exercise - 08/16/15 0001    Neck Exercises: Machines for Strengthening   Cybex Row both handles 2x10 each 25#  cues for posture   Other Machines for Strengthening narrow and wide lat pull down 2 x 10 25#  cues for posture   Other Machines for Strengthening shoulder press 2x10 each handle 25#   cues for posture   Moist Heat Therapy   Number Minutes Moist Heat 15 Minutes   Moist Heat Location Cervical   Manual Therapy   Manual therapy comments suboccipital release, manual trigger point release to upper traps and cervical paraspinals to decrease mucle spasm and increase range of motion.  pt shown tennis ball technique x5  minutes   Neck Exercises: Stretches   Upper Trapezius Stretch 30 seconds;3 reps   Levator Stretch 3 reps;30 seconds                  PT Short Term Goals - 08/09/15 0845    PT SHORT TERM GOAL #1   Title Pt will increase right cervical rotation by 10 degrees    Baseline improved per visual inspection    Time 4   Period Weeks   Status Achieved   PT SHORT TERM GOAL #2   Title Patient will report no pain into the forehead and scalp    Baseline no pain    Time 4   Period Weeks   Status Achieved   PT SHORT TERM GOAL #3   Title Patient will be I w/ HEP for cervical mobility and posture    Baseline No HEP    Time 4   Period Weeks   Status Achieved           PT Long Term Goals - 08/16/15 0805    PT LONG TERM GOAL #1   Title Patient will return to the gym with  a program to promote posture and decrease stress to the neck   Time 8   Period Weeks   Status On-going   PT LONG TERM GOAL #2   Title Patient will sit at work for 1 hour without a significant increase in pain    Baseline 1 to 2 hours   Time 8   Period Weeks   Status Achieved   PT LONG TERM GOAL #3   Title Patient will sleep thorugh the night without increased pain    Baseline No trouble sleeping    Time 8   Period Weeks   Status Achieved   PT LONG TERM GOAL #4   Title Patient will demsotrate a 39% defecit on FOTO   Baseline 34%   Time 8   Period Weeks   Status Achieved               Plan - 08/16/15 1017    Clinical Impression Statement Pt reports improved ability to sit between 1-2 hours at work prior to increased pain. His cervical ROM has improved however still limited on left rotation and left side bend. He feels pulling in sub occipitals with end range movements. Instructed pt in supine sub occipital release with tennis balls and given for home. Continued gym equipment. Pt would like to add strengthening to his gym routine as he previously has only performed aerobic exercise. Pt reports  release of sub occipitals with tennis ball technique at end of treatment. LTG# 2,#4 Met.    PT Next Visit Plan continue with light manual therapy to traps, levators, sub occipitals. If patient can tolerate progress to mechanical traction. Add in postural exercises. Patient would like an eventaul progression to a gym program. Monitor facial symptoms.; Did pt try swimming.       Patient will benefit from skilled therapeutic intervention in order to improve the following deficits and impairments:  Decreased mobility, Decreased activity tolerance, Decreased range of motion, Pain, Increased muscle spasms, Decreased endurance, Decreased strength, Impaired sensation  Visit Diagnosis: Cervicalgia  Other muscle spasm     Problem List Patient Active Problem List   Diagnosis Date Noted  . Cervico-occipital neuralgia of left side 07/08/2015  . Seborrheic dermatitis 07/02/2015  . Generalized anxiety disorder 02/17/2015  . HTN (hypertension) 09/11/2014  . Snores 09/11/2014  . Situational anxiety 09/11/2014  . Sinusitis, chronic 09/11/2014  . GERD (gastroesophageal reflux disease) 09/11/2014    Dorene Ar, PTA 08/16/2015, 10:24 AM  [facility] [address] [place], [place], [address] Phone: [phone]   Fax:  [phone]  Name: Brendan Neal MRN: [id number] Date of Birth: 12-01-1965

## 2015-08-18 ENCOUNTER — Ambulatory Visit: Payer: BC Managed Care – PPO | Admitting: Physical Therapy

## 2015-08-18 DIAGNOSIS — M542 Cervicalgia: Secondary | ICD-10-CM

## 2015-08-18 DIAGNOSIS — M62838 Other muscle spasm: Secondary | ICD-10-CM

## 2015-08-18 NOTE — Patient Instructions (Signed)
Continue HEP and recommended stretching throughout his work day.
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
## 2015-08-26 ENCOUNTER — Ambulatory Visit: Payer: BC Managed Care – PPO | Attending: Family Medicine | Admitting: Physical Therapy

## 2015-08-26 DIAGNOSIS — M542 Cervicalgia: Secondary | ICD-10-CM | POA: Diagnosis present

## 2015-08-26 DIAGNOSIS — M62838 Other muscle spasm: Secondary | ICD-10-CM | POA: Insufficient documentation
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
## 2015-08-27 ENCOUNTER — Ambulatory Visit: Payer: BC Managed Care – PPO | Admitting: Physical Therapy

## 2015-08-27 DIAGNOSIS — M62838 Other muscle spasm: Secondary | ICD-10-CM

## 2015-08-27 DIAGNOSIS — M542 Cervicalgia: Secondary | ICD-10-CM

## 2015-08-27 NOTE — Therapy (Signed)
[place], [place], [address] Phone: [phone]   Fax:  [phone]  Physical Therapy Treatment  Patient Details  Name: Brendan Neal MRN: [id number] Date of Birth: 1965-03-08 Referring Provider: Dr Lynne Leader   Encounter Date: 08/27/2015      PT End of Session - 08/27/15 0853    Visit Number 11   Number of Visits 16   Date for PT Re-Evaluation 09/08/15   Authorization Type BCBS State    Authorization Time Period FOTO at visit 6-8   PT Start Time 0845   PT Stop Time 0945   PT Time Calculation (min) 60 min      Past Medical History  Diagnosis Date  . Genital warts   . Hypertension     Past Surgical History  Procedure Laterality Date  . Tonsillectomy    . Wisdom tooth extraction      There were no vitals filed for this visit.      Subjective Assessment - 08/27/15 0851    Subjective a little tiny bit of pain. i had to cut down trees, felt stiff    Currently in Pain? Yes   Pain Score --  .5/10   Aggravating Factors  cutting down trees with lopper   Pain Relieving Factors stretches, swimming            [facility] PT Assessment - 08/27/15 0001    AROM   Cervical - Right Rotation 65   Cervical - Left Rotation 75                     [facility] Adult PT Treatment/Exercise - 08/27/15 0001    Neck Exercises: Machines for Strengthening   Cybex Row both handles 2x10 each 30#  cues for posture   Other Machines for Strengthening narrow and wide lat pull down 2 x 10 30#  cues for posture   Neck Exercises: Theraband   Horizontal ABduction Blue;20 reps   Horizontal ABduction Limitations then diagonals/sash 10 x 2 bilateral   Other Theraband Exercises Bilateral external rotation Blue 10x2   Moist Heat Therapy   Number Minutes Moist Heat 15 Minutes   Moist Heat Location Cervical   Manual Therapy   Manual therapy comments suboccipital release, manual trigger point release to upper traps  and cervical paraspinals to decrease mucle spasm and increase range of motion.  also levator scap   Neck Exercises: Stretches   Upper Trapezius Stretch 30 seconds;3 reps   Levator Stretch 3 reps;30 seconds   Corner Stretch 3 reps;30 seconds                PT Education - 08/26/15 0826    Education provided Yes   Education Details continue with current home exercises    Person(s) Educated Patient   Methods Explanation   Comprehension Verbalized understanding;Returned demonstration          PT Short Term Goals - 08/26/15 1013    PT SHORT TERM GOAL #1   Title Pt will increase right cervical rotation by 10 degrees    Baseline improved per visual inspection    Time 4   Period Weeks   Status Achieved   PT SHORT TERM GOAL #2   Title Patient will report no pain into the forehead and scalp    Baseline no pain    Time 4   Period Weeks   Status Achieved   PT SHORT TERM GOAL #3   Title Patient  will be I w/ HEP for cervical mobility and posture    Baseline No HEP    Time 4   Period Weeks   Status Achieved           PT Long Term Goals - 08/26/15 1014    PT LONG TERM GOAL #1   Title Patient will return to the gym with a program to promote posture and decrease stress to the neck   Baseline increasing swimming distance    Time 8   Status On-going   PT LONG TERM GOAL #2   Title Patient will sit at work for 1 hour without a significant increase in pain    Baseline 1 to 2 hours   Time 8   Period Weeks   Status Achieved   PT LONG TERM GOAL #3   Title Patient will sleep thorugh the night without increased pain    Baseline No trouble sleeping    Period Weeks   Status Achieved   PT LONG TERM GOAL #4   Period Weeks   Status Achieved               Plan - 08/27/15 0926    Clinical Impression Statement Pt reports continued intermittent tension in neck musculature however much overall improvement. His AROM for cervial rotation has decreased and is still 10  degrees less on right verses left. Performed manual active release to bilateral levator scapula. Pt demonstrates tenderness. Began corner stretcing for pectoralis flexibility. Standing blue band scap stab fatigues patient quickly.    PT Next Visit Plan how did it go chopping down trees? Prone scap stab? counter push ups? continue dorway/corner stretch   PT Home Exercise Plan levator stretch, upper trap stretch, scalene stretch,self cervical traction. Shoulder extesnion, Scapular retraction shoulder, external rotation ; D2 flexion; horrizontal abduction, Added Staning posture correction today against the wall for cervical and scapular retraction.       Patient will benefit from skilled therapeutic intervention in order to improve the following deficits and impairments:  Decreased mobility, Decreased activity tolerance, Decreased range of motion, Pain, Increased muscle spasms, Decreased endurance, Decreased strength, Impaired sensation  Visit Diagnosis: Cervicalgia  Other muscle spasm     Problem List Patient Active Problem List   Diagnosis Date Noted  . Cervico-occipital neuralgia of left side 07/08/2015  . Seborrheic dermatitis 07/02/2015  . Generalized anxiety disorder 02/17/2015  . HTN (hypertension) 09/11/2014  . Snores 09/11/2014  . Situational anxiety 09/11/2014  . Sinusitis, chronic 09/11/2014  . GERD (gastroesophageal reflux disease) 09/11/2014    Dorene Ar, PTA 08/27/2015, 10:17 AM  [facility] [address] [place], [place], [address] Phone: [phone]   Fax:  [phone]  Name: Brendan Neal MRN: [id number] Date of Birth: [birth year]

## 2015-08-31 ENCOUNTER — Encounter: Payer: Self-pay | Admitting: Physical Therapy

## 2015-09-02 ENCOUNTER — Ambulatory Visit: Payer: BC Managed Care – PPO | Admitting: Physical Therapy

## 2015-09-02 DIAGNOSIS — M542 Cervicalgia: Secondary | ICD-10-CM

## 2015-09-02 DIAGNOSIS — M62838 Other muscle spasm: Secondary | ICD-10-CM

## 2015-09-02 NOTE — Therapy (Addendum)
[place], [place], [address] Phone: [phone]   Fax:  [phone]  Physical Therapy Treatment  Patient Details  Name: Brendan Neal MRN: [id number] Date of Birth: [birth year] Referring Provider: Dr Lynne Leader   Encounter Date: 09/02/2015      PT End of Session - 09/02/15 0805    Visit Number 12   Number of Visits 16   Date for PT Re-Evaluation 09/08/15   Authorization Type BCBS State    Authorization Time Period FOTO at visit 6-8   PT Start Time 0802   PT Stop Time 0840   PT Time Calculation (min) 38 min      Past Medical History  Diagnosis Date  . Genital warts   . Hypertension     Past Surgical History  Procedure Laterality Date  . Tonsillectomy    . Wisdom tooth extraction      There were no vitals filed for this visit.      Subjective Assessment - 09/02/15 0806    Currently in Pain? Yes   Pain Score --  .5/10            [facility] PT Assessment - 09/02/15 0001    Observation/Other Assessments   Focus on Therapeutic Outcomes (FOTO)  34% limited  improved from 68% limited   AROM   Cervical - Right Side Bend 40   Cervical - Left Side Bend 40   Cervical - Right Rotation 68   Cervical - Left Rotation 78                     [facility] Adult PT Treatment/Exercise - 09/02/15 0001    Neck Exercises: Prone   Other Prone Exercise Prone I, T, Bent T and Y x 10 each   Manual Therapy   Manual therapy comments suboccipital release, manual trigger point release to upper traps and cervical paraspinals to decrease mucle spasm and increase range of motion.  also levator scap   Neck Exercises: Stretches   Upper Trapezius Stretch 30 seconds;3 reps   Levator Stretch 3 reps;30 seconds   Corner Stretch 3 reps;30 seconds                PT Education - 09/02/15 0901    Education provided Yes   Education Details Prone ITY, continue stretches, swimming    Person(s) Educated  Patient   Methods Explanation;Handout   Comprehension Verbalized understanding          PT Short Term Goals - 08/26/15 1013    PT SHORT TERM GOAL #1   Title Pt will increase right cervical rotation by 10 degrees    Baseline improved per visual inspection    Time 4   Period Weeks   Status Achieved   PT SHORT TERM GOAL #2   Title Patient will report no pain into the forehead and scalp    Baseline no pain    Time 4   Period Weeks   Status Achieved   PT SHORT TERM GOAL #3   Title Patient will be I w/ HEP for cervical mobility and posture    Baseline No HEP    Time 4   Period Weeks   Status Achieved           PT Long Term Goals - 09/02/15 2297    PT LONG TERM GOAL #1   Title Patient will return to the gym with a program to promote posture and decrease stress  to the neck   Time 8   Period Weeks   Status Achieved   PT LONG TERM GOAL #2   Title Patient will sit at work for 1 hour without a significant increase in pain    Status Achieved   PT LONG TERM GOAL #3   Title Patient will sleep thorugh the night without increased pain    Status Achieved   PT LONG TERM GOAL #4   Title Patient will demsotrate a 39% defecit on FOTO   Status Achieved               Plan - 09/02/15 0856    Clinical Impression Statement Pt reports he sprained his wrist helping his father push a lawnmower onto a trailer. He is unable to perform his theraband exercises due to wrist and forearm pain. Instruted pt in prone I, T Y series. Pt is able to maintain neutral wrist during these exercises. Updated HEP. Pt would like this to be his last visit. We reviewed his stretches. His FOTO score has improved to 34% limited from 68% limited on initial evaluation. All Goals are met.    PT Next Visit Plan discharge today      Patient will benefit from skilled therapeutic intervention in order to improve the following deficits and impairments:  Decreased mobility, Decreased activity tolerance, Decreased  range of motion, Pain, Increased muscle spasms, Decreased endurance, Decreased strength, Impaired sensation  Visit Diagnosis: Cervicalgia  Other muscle spasm    PHYSICAL THERAPY DISCHARGE SUMMARY  Visits from Start of Care: 12  Current functional level related to goals / functional outcomes: Met all goals for therapy    Remaining deficits: minor pain at times    Education / Equipment: HEP Plan: Patient agrees to discharge.  Patient goals were met. Patient is being discharged due to meeting the stated rehab goals.  ?????      Problem List Patient Active Problem List   Diagnosis Date Noted  . Cervico-occipital neuralgia of left side 07/08/2015  . Seborrheic dermatitis 07/02/2015  . Generalized anxiety disorder 02/17/2015  . HTN (hypertension) 09/11/2014  . Snores 09/11/2014  . Situational anxiety 09/11/2014  . Sinusitis, chronic 09/11/2014  . GERD (gastroesophageal reflux disease) 09/11/2014    Dorene Ar, PTA 09/02/2015, 9:01 AM  Carolyne Littles PT DPT   [facility] [address] [place], [place], [address] Phone: [phone]   Fax:  [phone]  Name: Brendan Neal MRN: [id number] Date of Birth: 02-Jan-1966

## 2015-09-02 NOTE — Patient Instructions (Signed)
Perform 4 prone exercises:  1 scap squeeze and lift arms 10 reps, 1-2 sets, 2 x per day 2 Horzontal abduction 10 reps, 1-2 sets, 2 x per day 3 Bent T 10 reps, 1-2 sets, 2 x per day 4 Y 10 reps 1-2 sets, 2 x per day

## 2015-09-06 ENCOUNTER — Ambulatory Visit: Payer: BC Managed Care – PPO | Admitting: Physical Therapy

## 2015-09-07 ENCOUNTER — Other Ambulatory Visit: Payer: Self-pay | Admitting: Family Medicine

## 2015-09-08 ENCOUNTER — Encounter: Payer: Self-pay | Admitting: Physical Therapy

## 2015-09-23 ENCOUNTER — Encounter: Payer: Self-pay | Admitting: Family Medicine

## 2015-09-23 ENCOUNTER — Ambulatory Visit (INDEPENDENT_AMBULATORY_CARE_PROVIDER_SITE_OTHER): Payer: BC Managed Care – PPO | Admitting: Family Medicine

## 2015-09-23 VITALS — BP 140/98 | HR 101 | Wt 242.0 lb

## 2015-09-23 DIAGNOSIS — Z23 Encounter for immunization: Secondary | ICD-10-CM

## 2015-09-23 DIAGNOSIS — Z114 Encounter for screening for human immunodeficiency virus [HIV]: Secondary | ICD-10-CM

## 2015-09-23 DIAGNOSIS — E669 Obesity, unspecified: Secondary | ICD-10-CM | POA: Diagnosis not present

## 2015-09-23 DIAGNOSIS — F418 Other specified anxiety disorders: Secondary | ICD-10-CM | POA: Diagnosis not present

## 2015-09-23 DIAGNOSIS — I10 Essential (primary) hypertension: Secondary | ICD-10-CM

## 2015-09-23 DIAGNOSIS — E66812 Obesity, class 2: Secondary | ICD-10-CM | POA: Insufficient documentation

## 2015-09-23 HISTORY — DX: Obesity, unspecified: E66.9

## 2015-09-23 MED ORDER — HYDROCHLOROTHIAZIDE 12.5 MG PO TABS: 12.5000 mg | ORAL_TABLET | Freq: Every day | ORAL | 3 refills | Status: DC

## 2015-09-23 NOTE — Patient Instructions (Addendum)
Thank you for coming in today. Continue hydrochlorothiazide. Get fasting labs soon. You should be able to go to any [name] or [facility] location in [place].  Return in 6-12 months or sooner if needed.  Work on weight loss.

## 2015-09-23 NOTE — Progress Notes (Signed)
Brendan Neal is a 50 y.o. male who presents to [facility] today for follow-up hypertension, discuss anxiety, and follow-up neck pain.  1) hypertension: Previously doing well with 12.5 mg of hydrochlorothiazide. In the interim patient has had worsening anxiety and gained weight due to stress eating. He notes when he checks his blood pressure at the gym is typically 115-125/75-85. He denies any chest pains palpitations or shortness of breath.  2) anxiety: Worsened recently. Patient is worsening stressors at home and work. He's worried about the immigration status of his husband who is from [place]. Brendan Neal has a green card and is currently applying for citizenship and is worried about the current administration.  He notes difficulty controlling worrying and interfering with sleep.  3) neck pain: Much better with physical therapy. Feels well otherwise.   Past Medical History:  Diagnosis Date  . Genital warts   . Hypertension    Past Surgical History:  Procedure Laterality Date  . TONSILLECTOMY    . WISDOM TOOTH EXTRACTION     Social History  Substance Use Topics  . Smoking status: Never Smoker  . Smokeless tobacco: Not on file  . Alcohol use No   family history includes Alcohol abuse in his brother; Depression in his brother; Diabetes in his maternal aunt.  ROS as above:  Medications: Current Outpatient Prescriptions  Medication Sig Dispense Refill  . Calcium Carbonate Antacid (TUMS PO) Take by mouth.    Neta Ehlers SALINE NASAL SPRAY NA Place into the nose.    . hydrochlorothiazide (HYDRODIURIL) 12.5 MG tablet Take 1 tablet (12.5 mg total) by mouth daily. 90 tablet 3  . loratadine (CLARITIN) 10 MG tablet Take 10 mg by mouth daily.    . ranitidine (ZANTAC) 300 MG tablet Take 1 tablet (300 mg total) by mouth 2 (two) times daily. 180 tablet 3  . triamcinolone cream  (KENALOG) 0.5 % Apply 1 application topically 2 (two) times daily. To affected areas. 30 g 3   No current facility-administered medications for this visit.    No Known Allergies   Exam:  BP (!) 140/98   Pulse (!) 101   Wt 242 lb (109.8 kg)   BMI 32.37 kg/m  Gen: Well NAD HEENT: EOMI,  MMM Lungs: Normal work of breathing. CTABL Heart: RRR no MRG Abd: NABS, Soft. Nondistended, Nontender Exts: Brisk capillary refill, warm and well perfused.  Psych: Alert and oriented normal speech thought process and affect.  Depression screen PHQ 2/9 09/23/2015  Decreased Interest 0  Down, Depressed, Hopeless 1  PHQ - 2 Score 1  Altered sleeping 1  Tired, decreased energy 1  Change in appetite 1  Feeling bad or failure about yourself  1  Trouble concentrating 0  Moving slowly or fidgety/restless 0  Suicidal thoughts 0  PHQ-9 Score 5  Difficult doing work/chores Somewhat difficult    GAD 7 : Generalized Anxiety Score 09/23/2015  Nervous, Anxious, on Edge 1  Control/stop worrying 1  Worry too much - different things 1  Trouble relaxing 1  Restless 0  Easily annoyed or irritable 1  Afraid - awful might happen 1  Total GAD 7 Score 6  Anxiety Difficulty Somewhat difficult      No results found for this or any previous visit (from the past 24 hour(s)). No results found.    Assessment and Plan: 50 y.o. male with   1) hypertension: Better controlled at home. Continue current regimen.  Work on weight loss. Recheck in 6-12 months. Check fasting labs  2) anxiety: Doing reasonably well slightly worse recently. Recommend counseling.  3) neck pain: Improved. Watchful waiting  4) obesity: Worsened recently. Work on weight loss. Check basic fasting labs.  5) health maintenance: Tdap given today. HIV screening pending   Orders Placed This Encounter  Procedures  . Tdap vaccine greater than or equal to 7yo IM  . CBC  . Comprehensive metabolic panel    Order Specific Question:   Has the  patient fasted?    Answer:   No  . Hemoglobin A1c  . Lipid panel    Order Specific Question:   Has the patient fasted?    Answer:   No  . TSH  . VITAMIN D 25 Hydroxy (Vit-D Deficiency, Fractures)  . HIV antibody    Discussed warning signs or symptoms. Please see discharge instructions. Patient expresses understanding.

## 2015-10-11 ENCOUNTER — Other Ambulatory Visit: Payer: Self-pay | Admitting: Family Medicine

## 2015-12-15 ENCOUNTER — Other Ambulatory Visit: Payer: Self-pay | Admitting: Family Medicine

## 2016-01-23 ENCOUNTER — Other Ambulatory Visit: Payer: Self-pay | Admitting: Family Medicine

## 2016-03-03 ENCOUNTER — Other Ambulatory Visit: Payer: Self-pay | Admitting: Family Medicine

## 2016-03-12 ENCOUNTER — Other Ambulatory Visit: Payer: Self-pay | Admitting: Family Medicine

## 2016-03-12 DIAGNOSIS — K219 Gastro-esophageal reflux disease without esophagitis: Secondary | ICD-10-CM

## 2016-03-29 ENCOUNTER — Telehealth: Payer: Self-pay | Admitting: Family Medicine

## 2016-03-29 NOTE — Telephone Encounter (Signed)
Left patient a voicemail on 03/24/16 inquiring about flu shot.

## 2016-05-08 ENCOUNTER — Other Ambulatory Visit: Payer: Self-pay | Admitting: Family Medicine

## 2016-06-08 ENCOUNTER — Other Ambulatory Visit: Payer: Self-pay | Admitting: Family Medicine

## 2016-06-08 DIAGNOSIS — K219 Gastro-esophageal reflux disease without esophagitis: Secondary | ICD-10-CM

## 2016-08-10 ENCOUNTER — Ambulatory Visit (INDEPENDENT_AMBULATORY_CARE_PROVIDER_SITE_OTHER): Payer: BC Managed Care – PPO | Admitting: Family Medicine

## 2016-08-10 ENCOUNTER — Other Ambulatory Visit: Payer: Self-pay | Admitting: Family Medicine

## 2016-08-10 ENCOUNTER — Ambulatory Visit (INDEPENDENT_AMBULATORY_CARE_PROVIDER_SITE_OTHER): Payer: BC Managed Care – PPO

## 2016-08-10 VITALS — BP 138/100 | HR 91 | Temp 98.1°F | Wt 244.0 lb

## 2016-08-10 DIAGNOSIS — R05 Cough: Secondary | ICD-10-CM

## 2016-08-10 DIAGNOSIS — R062 Wheezing: Secondary | ICD-10-CM

## 2016-08-10 DIAGNOSIS — R059 Cough, unspecified: Secondary | ICD-10-CM

## 2016-08-10 MED ORDER — MONTELUKAST SODIUM 10 MG PO TABS: 10.0000 mg | ORAL_TABLET | Freq: Every day | ORAL | 3 refills | Status: DC

## 2016-08-10 MED ORDER — ALBUTEROL SULFATE HFA 108 (90 BASE) MCG/ACT IN AERS: 2.0000 | INHALATION_SPRAY | Freq: Four times a day (QID) | RESPIRATORY_TRACT | 0 refills | Status: DC | PRN

## 2016-08-10 MED ORDER — AZITHROMYCIN 250 MG PO TABS: 250.0000 mg | ORAL_TABLET | Freq: Every day | ORAL | 0 refills | Status: DC

## 2016-08-10 NOTE — Patient Instructions (Signed)
Thank you for coming in today. Take azithromycin daily.  Use albuterol inhaler as needed.  Add Singulair to allergy plan.   Albuterol inhalation aerosol What is this medicine? ALBUTEROL (al Normajean Glasgow) is a bronchodilator. It helps open up the airways in your lungs to make it easier to breathe. This medicine is used to treat and to prevent bronchospasm. This medicine may be used for other purposes; ask your health care provider or pharmacist if you have questions. COMMON BRAND NAME(S): Proair HFA, Proventil, Proventil HFA, Respirol, Ventolin, Ventolin HFA What should I tell my health care provider before I take this medicine? They need to know if you have any of the following conditions: -diabetes -heart disease or irregular heartbeat -high blood pressure -pheochromocytoma -seizures -thyroid disease -an unusual or allergic reaction to albuterol, levalbuterol, sulfites, other medicines, foods, dyes, or preservatives -pregnant or trying to get pregnant -breast-feeding How should I use this medicine? This medicine is for inhalation through the mouth. Follow the directions on your prescription label. Take your medicine at regular intervals. Do not use more often than directed. Make sure that you are using your inhaler correctly. Ask you doctor or health care provider if you have any questions. Talk to your pediatrician regarding the use of this medicine in children. Special care may be needed. Overdosage: If you think you have taken too much of this medicine contact a poison control center or emergency room at once. NOTE: This medicine is only for you. Do not share this medicine with others. What if I miss a dose? If you miss a dose, use it as soon as you can. If it is almost time for your next dose, use only that dose. Do not use double or extra doses. What may interact with this medicine? -anti-infectives like chloroquine and pentamidine -caffeine -cisapride -diuretics -medicines for  colds -medicines for depression or for emotional or psychotic conditions -medicines for weight loss including some herbal products -methadone -some antibiotics like clarithromycin, erythromycin, levofloxacin, and linezolid -some heart medicines -steroid hormones like dexamethasone, cortisone, hydrocortisone -theophylline -thyroid hormones This list may not describe all possible interactions. Give your health care provider a list of all the medicines, herbs, non-prescription drugs, or dietary supplements you use. Also tell them if you smoke, drink alcohol, or use illegal drugs. Some items may interact with your medicine. What should I watch for while using this medicine? Tell your doctor or health care professional if your symptoms do not improve. Do not use extra albuterol. If your asthma or bronchitis gets worse while you are using this medicine, call your doctor right away. If your mouth gets dry try chewing sugarless gum or sucking hard candy. Drink water as directed. What side effects may I notice from receiving this medicine? Side effects that you should report to your doctor or health care professional as soon as possible: -allergic reactions like skin rash, itching or hives, swelling of the face, lips, or tongue -breathing problems -chest pain -feeling faint or lightheaded, falls -high blood pressure -irregular heartbeat -fever -muscle cramps or weakness -pain, tingling, numbness in the hands or feet -vomiting Side effects that usually do not require medical attention (report to your doctor or health care professional if they continue or are bothersome): -cough -difficulty sleeping -headache -nervousness or trembling -stomach upset -stuffy or runny nose -throat irritation -unusual taste This list may not describe all possible side effects. Call your doctor for medical advice about side effects. You may report side effects to FDA at  1-800-FDA-1088. Where should I keep my  medicine? Keep out of the reach of children. Store at room temperature between 15 and 30 degrees C (59 and 86 degrees F). The contents are under pressure and may burst when exposed to heat or flame. Do not freeze. This medicine does not work as well if it is too cold. Throw away any unused medicine after the expiration date. Inhalers need to be thrown away after the labeled number of puffs have been used or by the expiration date; whichever comes first. Ventolin HFA should be thrown away 12 months after removing from foil pouch. Check the instructions that come with your medicine. NOTE: This sheet is a summary. It may not cover all possible information. If you have questions about this medicine, talk to your doctor, pharmacist, or health care provider.  2018 Elsevier/Gold Standard (2012-07-25 10:57:17)

## 2016-08-10 NOTE — Progress Notes (Signed)
Brendan Neal is a 51 y.o. male who presents to [facility] today for follow-up cough congestion and ear fullness and sinus pressure. Patient notes seasonal allergy symptoms ongoing since April. He has been using Claritin and Flonase nasal spray. This helped some but he continues to experience bilateral ear fullness and sinus pressure. He notes recently he has developed a cough and wheezing. These are consistent with similar symptoms when he was diagnosed with walking pneumonia several years ago. He denies chest pain or palpitations or severe shortness of breath.   Past Medical History:  Diagnosis Date  . Genital warts   . Hypertension    Past Surgical History:  Procedure Laterality Date  . TONSILLECTOMY    . WISDOM TOOTH EXTRACTION     Social History  Substance Use Topics  . Smoking status: Never Smoker  . Smokeless tobacco: Not on file  . Alcohol use No   family history includes Alcohol abuse in his brother; Depression in his brother; Diabetes in his maternal aunt.  ROS as above:  Medications: Current Outpatient Prescriptions  Medication Sig Dispense Refill  . albuterol (PROVENTIL HFA;VENTOLIN HFA) 108 (90 Base) MCG/ACT inhaler Inhale 2 puffs into the lungs every 6 (six) hours as needed for wheezing or shortness of breath. 1 Inhaler 0  . azithromycin (ZITHROMAX) 250 MG tablet Take 1 tablet (250 mg total) by mouth daily. Take first 2 tablets together, then 1 every day until finished. 6 tablet 0  . Calcium Carbonate Antacid (TUMS PO) Take by mouth.    Neta Ehlers SALINE NASAL SPRAY NA Place into the nose.    . hydrochlorothiazide (HYDRODIURIL) 12.5 MG tablet Take 1 tablet (12.5 mg total) by mouth daily. Due for follow up visit 30 tablet 0  . loratadine (CLARITIN) 10 MG tablet Take 10 mg by mouth daily.    . montelukast (SINGULAIR) 10 MG tablet Take 1 tablet (10 mg  total) by mouth at bedtime. 30 tablet 3  . ranitidine (ZANTAC) 300 MG tablet TAKE 1 TABLET(300 MG) BY MOUTH TWICE DAILY 180 tablet 0  . rizatriptan (MAXALT-MLT) 5 MG disintegrating tablet TAKE 1 TABLET BY MOUTH EVERY 2 HOURS. NEED FOLLOW UP APT FOR MORE REFILLS 10 tablet 3  . triamcinolone cream (KENALOG) 0.5 % Apply 1 application topically 2 (two) times daily. To affected areas. 30 g 3   No current facility-administered medications for this visit.    No Known Allergies  Health Maintenance Health Maintenance  Topic Date Due  . HIV Screening  05/20/1982  . INFLUENZA VACCINE  09/20/2016  . TETANUS/TDAP  09/22/2025     Exam:  BP (!) 138/100   Pulse 91   Temp 98.1 F (36.7 C) (Oral)   Wt 244 lb (110.7 kg)   SpO2 93%   BMI 32.64 kg/m  Gen: Well NAD HEENT: EOMI,  MMM Normal tympanic membranes bilaterally. Posterior pharynx cobblestoning. Cervical and submandibular mild lymphadenopathy is present bilaterally Lungs: Normal work of breathing. CTABL Heart: RRR no MRG Abd: NABS, Soft. Nondistended, Nontender Exts: Brisk capillary refill, warm and well perfused.    No results found for this or any previous visit (from the past 72 hour(s)).     Assessment and Plan: 51 y.o. male with Coughing congestion associated with decreased oxygen saturation concerning for potential pneumonia. Chest x-ray pending. Empiric treatment with azithromycin.  Patient has bothersome seasonal allergy symptoms. Will use an albuterol inhaler as needed and prescribe montelukast.  Orders Placed This Encounter  Procedures  . DG Chest 2 View    Order Specific Question:   Reason for exam:    Answer:   Cough, assess intra-thoracic pathology    Order Specific Question:   Preferred imaging location?    Answer:   [facility]   Meds ordered this encounter  Medications  . azithromycin (ZITHROMAX) 250 MG tablet    Sig: Take 1 tablet (250 mg total) by mouth daily. Take first 2 tablets together,  then 1 every day until finished.    Dispense:  6 tablet    Refill:  0  . albuterol (PROVENTIL HFA;VENTOLIN HFA) 108 (90 Base) MCG/ACT inhaler    Sig: Inhale 2 puffs into the lungs every 6 (six) hours as needed for wheezing or shortness of breath.    Dispense:  1 Inhaler    Refill:  0  . montelukast (SINGULAIR) 10 MG tablet    Sig: Take 1 tablet (10 mg total) by mouth at bedtime.    Dispense:  30 tablet    Refill:  3     Discussed warning signs or symptoms. Please see discharge instructions. Patient expresses understanding.

## 2016-09-01 ENCOUNTER — Ambulatory Visit (INDEPENDENT_AMBULATORY_CARE_PROVIDER_SITE_OTHER): Payer: BC Managed Care – PPO | Admitting: Family Medicine

## 2016-09-01 ENCOUNTER — Encounter: Payer: Self-pay | Admitting: Family Medicine

## 2016-09-01 VITALS — BP 128/87 | HR 77 | Wt 246.0 lb

## 2016-09-01 DIAGNOSIS — Z114 Encounter for screening for human immunodeficiency virus [HIV]: Secondary | ICD-10-CM | POA: Diagnosis not present

## 2016-09-01 DIAGNOSIS — I10 Essential (primary) hypertension: Secondary | ICD-10-CM | POA: Diagnosis not present

## 2016-09-01 DIAGNOSIS — R0683 Snoring: Secondary | ICD-10-CM | POA: Diagnosis not present

## 2016-09-01 DIAGNOSIS — K219 Gastro-esophageal reflux disease without esophagitis: Secondary | ICD-10-CM

## 2016-09-01 DIAGNOSIS — J320 Chronic maxillary sinusitis: Secondary | ICD-10-CM

## 2016-09-01 DIAGNOSIS — F418 Other specified anxiety disorders: Secondary | ICD-10-CM

## 2016-09-01 MED ORDER — HYDROCHLOROTHIAZIDE 12.5 MG PO TABS: 12.5000 mg | ORAL_TABLET | Freq: Every day | ORAL | 3 refills | Status: DC

## 2016-09-01 NOTE — Patient Instructions (Addendum)
Thank you for coming in today. Get labs soon. Get fasting labs.  Continue blood pressure.  You should hear from the allergy and sinus doctors soon.  Let me know if you do not hear anything in 1 week.   You can search for any [name] location.   Work on [employer].    Sinusitis, Adult Sinusitis is soreness and inflammation of your sinuses. Sinuses are hollow spaces in the bones around your face. They are located:  Around your eyes.  In the middle of your forehead.  Behind your nose.  In your cheekbones.  Your sinuses and nasal passages are lined with a stringy fluid (mucus). Mucus normally drains out of your sinuses. When your nasal tissues get inflamed or swollen, the mucus can get trapped or blocked so air cannot flow through your sinuses. This lets bacteria, viruses, and funguses grow, and that leads to infection. Follow these instructions at home: Medicines  Take, use, or apply over-the-counter and prescription medicines only as told by your doctor. These may include nasal sprays.  If you were prescribed an antibiotic medicine, take it as told by your doctor. Do not stop taking the antibiotic even if you start to feel better. Hydrate and Humidify  Drink enough water to keep your pee (urine) clear or pale yellow.  Use a cool mist humidifier to keep the humidity level in your home above 50%.  Breathe in steam for 10-15 minutes, 3-4 times a day or as told by your doctor. You can do this in the bathroom while a hot shower is running.  Try not to spend time in cool or dry air. Rest  Rest as much as possible.  Sleep with your head raised (elevated).  Make sure to get enough sleep each night. General instructions  Put a warm, moist washcloth on your face 3-4 times a day or as told by your doctor. This will help with discomfort.  Wash your hands often with soap and water. If there is no soap and water, use hand sanitizer.  Do not smoke. Avoid being around  people who are smoking (secondhand smoke).  Keep all follow-up visits as told by your doctor. This is important. Contact a doctor if:  You have a fever.  Your symptoms get worse.  Your symptoms do not get better within 10 days. Get help right away if:  You have a very bad headache.  You cannot stop throwing up (vomiting).  You have pain or swelling around your face or eyes.  You have trouble seeing.  You feel confused.  Your neck is stiff.  You have trouble breathing. This information is not intended to replace advice given to you by your health care provider. Make sure you discuss any questions you have with your health care provider. Document Released: 07/26/2007 Document Revised: 10/03/2015 Document Reviewed: 12/02/2014 Elsevier Interactive Patient Education  [employer].

## 2016-09-01 NOTE — Progress Notes (Signed)
Brendan Neal is a 51 y.o. male who presents to [facility] [place] today for follow-up blood pressure and discuss sinus symptoms and stress.  Hypertension: Danial continues to take hydrochlorothiazide daily. He denies chest pain palpitations shortness of breath lightheadedness or dizziness. He thinks the medication works well. He notes that when he eats a lower salt diet his blood pressure is reduced at home.  Sinus symptoms: As previously discussed patient has chronic sinus congestion and pressure. He attributes this to allergies. This is been ongoing for years and he finds it very aggravating. He takes an oral antihistamine daily as well as using nasal steroid sprays daily which have not helped. Additionally he's tried oral montelukast which did not help. He is interested in potential surgery or allergy treatment.  Anxiety: Patient has a history of anxiety notes increased workload recently. He is coping well but notes that he is becoming a bit more irritable. He is not interested in medication at this time.   Past Medical History:  Diagnosis Date  . Genital warts   . Hypertension    Past Surgical History:  Procedure Laterality Date  . TONSILLECTOMY    . WISDOM TOOTH EXTRACTION     Social History  Substance Use Topics  . Smoking status: Never Smoker  . Smokeless tobacco: Never Used  . Alcohol use No   family history includes Alcohol abuse in his brother; Depression in his brother; Diabetes in his maternal aunt.  ROS as above:  Medications: Current Outpatient Prescriptions  Medication Sig Dispense Refill  . Calcium Carbonate Antacid (TUMS PO) Take by mouth daily as needed.     Marland Kitchen EQL SALINE NASAL SPRAY NA Place into the nose daily as needed.     . hydrochlorothiazide (HYDRODIURIL) 12.5 MG tablet Take 1 tablet (12.5 mg total) by mouth daily. 90 tablet 3  . loratadine  (CLARITIN) 10 MG tablet Take 10 mg by mouth daily as needed.     . ranitidine (ZANTAC) 300 MG tablet TAKE 1 TABLET(300 MG) BY MOUTH TWICE DAILY 180 tablet 0  . rizatriptan (MAXALT-MLT) 5 MG disintegrating tablet TAKE 1 TABLET BY MOUTH EVERY 2 HOURS. NEED FOLLOW UP APT FOR MORE REFILLS 10 tablet 3  . triamcinolone cream (KENALOG) 0.5 % Apply 1 application topically 2 (two) times daily. To affected areas. 30 g 3  . albuterol (PROVENTIL HFA;VENTOLIN HFA) 108 (90 Base) MCG/ACT inhaler Inhale 2 puffs into the lungs every 6 (six) hours as needed for wheezing or shortness of breath. (Patient not taking: Reported on 09/01/2016) 1 Inhaler 0  . montelukast (SINGULAIR) 10 MG tablet Take 1 tablet (10 mg total) by mouth at bedtime. (Patient not taking: Reported on 09/01/2016) 30 tablet 3   No current facility-administered medications for this visit.    No Known Allergies  Health Maintenance Health Maintenance  Topic Date Due  . HIV Screening  05/20/1982  . INFLUENZA VACCINE  09/20/2016  . TETANUS/TDAP  09/22/2025     Exam:  BP 128/87   Pulse 77   Wt 246 lb (111.6 kg)   SpO2 96%   BMI 32.90 kg/m  Gen: Well NAD HEENT: EOMI,  MMM . Mildly inflamed nasal turbinates bilaterally. Nontender maxillary sinuses. Lungs: Normal work of breathing. CTABL Heart: RRR no MRG Abd: NABS, Soft. Nondistended, Nontender Exts: Brisk capillary refill, warm and well perfused.  Psych: Alert and oriented normal speech some process and affect.  GAD 7 : Generalized Anxiety Score  09/01/2016 09/23/2015  Nervous, Anxious, on Edge 1 1  Control/stop worrying 0 1  Worry too much - different things 1 1  Trouble relaxing 1 1  Restless 0 0  Easily annoyed or irritable 1 1  Afraid - awful might happen 0 1  Total GAD 7 Score 4 6  Anxiety Difficulty - Somewhat difficult       No results found for this or any previous visit (from the past 72 hour(s)). No results found.    Assessment and Plan: 51 y.o. male with    Hypertension: Blood pressure at goal today. Plan to continue current medication. Check fasting labs in the near future.  Sinus symptoms: Likely allergic. Patient is failing conservative management. Plan for referral both to ENT as well as for allergy.  Anxiety/stress: Situational. Continue to follow along. Consider medications if needed.  We'll also add HIV screening as part of health maintenance   Orders Placed This Encounter  Procedures  . CBC  . COMPLETE METABOLIC PANEL WITH GFR  . Lipid Panel w/reflex Direct LDL  . HIV antibody  . Ambulatory referral to Allergy    Referral Priority:   Routine    Referral Type:   Allergy Testing    Referral Reason:   Specialty Services Required    Requested Specialty:   Allergy    Number of Visits Requested:   1  . Ambulatory referral to ENT    Referral Priority:   Routine    Referral Type:   Consultation    Referral Reason:   Specialty Services Required    Requested Specialty:   Otolaryngology    Number of Visits Requested:   1   Meds ordered this encounter  Medications  . hydrochlorothiazide (HYDRODIURIL) 12.5 MG tablet    Sig: Take 1 tablet (12.5 mg total) by mouth daily.    Dispense:  90 tablet    Refill:  3     Discussed warning signs or symptoms. Please see discharge instructions. Patient expresses understanding.

## 2016-09-09 ENCOUNTER — Other Ambulatory Visit: Payer: Self-pay | Admitting: Family Medicine

## 2016-09-09 DIAGNOSIS — K219 Gastro-esophageal reflux disease without esophagitis: Secondary | ICD-10-CM

## 2016-09-11 ENCOUNTER — Other Ambulatory Visit: Payer: Self-pay | Admitting: Family Medicine

## 2016-09-11 DIAGNOSIS — K219 Gastro-esophageal reflux disease without esophagitis: Secondary | ICD-10-CM

## 2016-09-12 ENCOUNTER — Encounter: Payer: Self-pay | Admitting: Allergy and Immunology

## 2016-09-12 ENCOUNTER — Other Ambulatory Visit: Payer: Self-pay | Admitting: Family Medicine

## 2016-09-12 ENCOUNTER — Ambulatory Visit (INDEPENDENT_AMBULATORY_CARE_PROVIDER_SITE_OTHER): Payer: Self-pay | Admitting: Allergy and Immunology

## 2016-09-12 VITALS — BP 122/78 | HR 93 | Temp 97.9°F | Resp 18 | Ht 73.0 in | Wt 246.4 lb

## 2016-09-12 DIAGNOSIS — J4520 Mild intermittent asthma, uncomplicated: Secondary | ICD-10-CM | POA: Diagnosis not present

## 2016-09-12 DIAGNOSIS — J3089 Other allergic rhinitis: Secondary | ICD-10-CM

## 2016-09-12 DIAGNOSIS — H6983 Other specified disorders of Eustachian tube, bilateral: Secondary | ICD-10-CM | POA: Diagnosis not present

## 2016-09-12 DIAGNOSIS — K219 Gastro-esophageal reflux disease without esophagitis: Secondary | ICD-10-CM

## 2016-09-12 MED ORDER — AZELASTINE-FLUTICASONE 137-50 MCG/ACT NA SUSP: NASAL | 5 refills | Status: DC

## 2016-09-12 NOTE — Patient Instructions (Addendum)
  1. Allergen avoidance measures as best as possible  2. Treat and prevent inflammation:   A. prednisone 10 mg - one tablet one time a day for 10 days  B. Dymista - one spray each nostril twice a day  C. montelukast 10 mg - one tablet one time per day  3. If needed:   A. nasal saline  B. OTC antihistamine - Claritin/Zyrtec/Allegra    C. Proventil HFA - 2 puffs every 4-6 hours  4. Return to clinic for skin testing without use of antihistamine including Dymista for 3 days   5. Consider a course of immunotherapy   6. Further evaluation and treatment for asthma?

## 2016-09-12 NOTE — Progress Notes (Signed)
Dear Dr. Georgina Snell,  Thank you for referring Brendan Neal to the [facility] on 09/12/2016.   Below is a summation of this patient's evaluation and recommendations.  Thank you for your referral. I will keep you informed about this patient's response to treatment.   If you have any questions please do not hesitate to contact me.   Sincerely,  Jiles Prows, MD Allergy / Immunology [place]   ______________________________________________________________________    NEW PATIENT NOTE  Referring Provider: Gregor Hams, MD Primary Provider: Gregor Hams, MD Date of office visit: 09/12/2016    Subjective:   Chief Complaint:  Brendan Neal (DOB: Sep 10, 1965) is a 51 y.o. male who presents to the clinic on 09/12/2016 with a chief complaint of Sinus Problem .     HPI: Brendan Neal presents to this clinic in evaluation of long-standing allergic disease that appeared to develop around 1993.  From March through October of each year he develops problems with very significant nasal congestion and a full head feeling and a full ear feeling along with some occasional sneezing and clear rhinorrhea. He describes this "congestion" in his ears. He has a resonance to his hearing and speaking and pressure in his ears without any tinnitus and without any vertigo. Occasionally he will develop an issue with coughing and shortness of breath when his head is extremely congested but he has not had one of those episodes in 2018. His last episode was in 2017. He tells me that he has had two "pneumonias", one in 2017 and 1 in 2016 during his March through October season. Provoking factors for her symptoms include exposure to pollen and locating to the outdoors in general. He uses Flonase intermittently and sometimes on a daily basis and sometimes 2 or 3 times per week and he uses various antihistamines in the treatment of this  condition.  He also has a history of migraines that is relatively rare. He has a triptan to use and he uses this medication about 3-6 times per year. He also has as very low grade daily headache that appears to occur from March through October. He does snore at night time and has some gasping on occasion but does not really have a tremendous amount of daytime sleepiness. He does not drink any caffeine to any extent.  Past Medical History:  Diagnosis Date  . Genital warts   . Hypertension     Past Surgical History:  Procedure Laterality Date  . TONSILLECTOMY    . WISDOM TOOTH EXTRACTION      Allergies as of 09/12/2016   No Known Allergies     Medication List      albuterol 108 (90 Base) MCG/ACT inhaler Commonly known as:  PROVENTIL HFA;VENTOLIN HFA Inhale 2 puffs into the lungs every 6 (six) hours as needed for wheezing or shortness of breath.   fluticasone 50 MCG/ACT nasal spray Commonly known as:  FLONASE Place 1 spray into both nostrils daily as needed for allergies or rhinitis.   GAS-X PO Take by mouth as needed.   hydrochlorothiazide 12.5 MG tablet Commonly known as:  HYDRODIURIL Take 1 tablet (12.5 mg total) by mouth daily.   loratadine 10 MG tablet Commonly known as:  CLARITIN Take 10 mg by mouth daily as needed.   montelukast 10 MG tablet Commonly known as:  SINGULAIR Take 1 tablet (10 mg total) by mouth at bedtime.  multivitamin tablet Take 1 tablet by mouth daily.   ranitidine 300 MG tablet Commonly known as:  ZANTAC TAKE 1 TABLET(300 MG) BY MOUTH TWICE DAILY   rizatriptan 5 MG disintegrating tablet Commonly known as:  MAXALT-MLT TAKE 1 TABLET BY MOUTH EVERY 2 HOURS. NEED FOLLOW UP APT FOR MORE REFILLS   triamcinolone cream 0.5 % Commonly known as:  KENALOG Apply 1 application topically 2 (two) times daily. To affected areas.   TUMS PO Take by mouth daily as needed.       Review of systems negative except as noted in HPI / PMHx or noted  below:  Review of Systems  Constitutional: Negative.   HENT: Negative.   Eyes: Negative.   Respiratory: Negative.   Cardiovascular: Negative.   Gastrointestinal: Negative.   Genitourinary: Negative.   Musculoskeletal: Negative.   Skin: Negative.   Neurological: Negative.   Endo/Heme/Allergies: Negative.   Psychiatric/Behavioral: Negative.     Family History  Problem Relation Age of Onset  . Alcohol abuse Brother   . Depression Brother   . Diabetes Maternal Aunt   . High blood pressure Mother   . Migraines Father   . High blood pressure Father   . High blood pressure Maternal Grandmother   . High blood pressure Maternal Grandfather   . High blood pressure Paternal Grandmother   . Diabetes Paternal Grandfather   . High blood pressure Paternal Grandfather     Social History   Social History  . Marital status: Married    Spouse name: N/A  . Number of children: N/A  . Years of education: N/A   Occupational History  . Not on file.   Social History Main Topics  . Smoking status: Never Smoker  . Smokeless tobacco: Never Used  . Alcohol use No  . Drug use: No  . Sexual activity: Yes   Other Topics Concern  . Not on file   Social History Narrative  . 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  HENT:  Head: Normocephalic.  Right Ear: External ear and ear canal normal.  Left Ear: External ear and ear canal normal.    Nose: Mucosal edema present. No rhinorrhea.  Mouth/Throat: Uvula is midline, oropharynx is clear and moist and mucous membranes are normal. No oropharyngeal exudate.  Lack of pneumatic movement tympanic membranes  Eyes: Conjunctivae are normal.  Neck: Trachea normal. No tracheal tenderness present. No tracheal deviation present. No thyromegaly present.  Cardiovascular: Normal rate, regular rhythm, S1 normal, S2 normal and normal heart sounds.   No murmur heard. Pulmonary/Chest: Breath sounds normal. No stridor. No respiratory distress. He has no wheezes. He has no rales.  Lymphadenopathy:       Head (right side): No tonsillar adenopathy present.       Head (left side): No tonsillar adenopathy present.    He has no cervical adenopathy.  Neurological: He  is alert. Gait normal.  Skin: No rash noted. He is not diaphoretic. No erythema. Nails show no clubbing.  Psychiatric: Mood and affect normal.    Diagnostics: Allergy skin tests were not performed secondary to the recent use of a antihistamine.  Spirometry was performed and demonstrated an FEV1 of 4.32 @ 100 % of predicted.  Assessment and Plan:    1. Other allergic rhinitis   2. Asthma, mild intermittent, well-controlled   3. ETD (Eustachian tube dysfunction), bilateral     1. Allergen avoidance measures as best as possible  2. Treat and prevent inflammation:   A. prednisone 10 mg - one tablet one time a day for 10 days  B. Dymista - one spray each nostril twice a day  C. montelukast 10 mg - one tablet one time per day  3. If needed:   A. nasal saline  B. OTC antihistamine - Claritin/Zyrtec/Allegra    C. Proventil HFA - 2 puffs every 4-6 hours  4. Return to clinic for skin testing without use of antihistamine including Dymista for 3 days   5. Consider a course of immunotherapy   6. Further evaluation and treatment for asthma?   Almin does have a history very consistent with atopic inflammation of his airway and ETD as  well as very intermittent asthma and we will define his aeroallergens profile next week when he returns to this clinic for skin testing without the use of an antihistamine. For now he will consistently use anti-inflammatory agents in the form of a nasal steroid and a leukotriene modifier and I have given him a short course of systemic steroids for the next 10 days. Further evaluation treatment will be based upon his response and the results of his diagnostic testing.  Jiles Prows, MD [facility]

## 2016-09-19 ENCOUNTER — Ambulatory Visit: Payer: BC Managed Care – PPO | Admitting: Allergy and Immunology

## 2016-10-03 ENCOUNTER — Ambulatory Visit (INDEPENDENT_AMBULATORY_CARE_PROVIDER_SITE_OTHER): Payer: BC Managed Care – PPO | Admitting: Allergy and Immunology

## 2016-10-03 ENCOUNTER — Encounter: Payer: Self-pay | Admitting: Allergy and Immunology

## 2016-10-03 ENCOUNTER — Ambulatory Visit: Payer: BC Managed Care – PPO | Admitting: Allergy and Immunology

## 2016-10-03 VITALS — BP 122/74 | HR 106 | Resp 14

## 2016-10-03 DIAGNOSIS — J4520 Mild intermittent asthma, uncomplicated: Secondary | ICD-10-CM

## 2016-10-03 DIAGNOSIS — J3089 Other allergic rhinitis: Secondary | ICD-10-CM

## 2016-10-03 NOTE — Progress Notes (Signed)
Brendan Neal returns to this clinic to have skin testing performed. He demonstrated hypersensitivity to house dust mite and mold. He will perform allergen avoidance measures and continue on his medications as prescribed during his last visit and we will see how things go over the course of the next 6 months. I did give him literature on immunotherapy and he is presently considering this option.

## 2016-10-06 DIAGNOSIS — J342 Deviated nasal septum: Secondary | ICD-10-CM | POA: Insufficient documentation

## 2016-10-06 HISTORY — DX: Deviated nasal septum: J34.2

## 2016-11-30 ENCOUNTER — Other Ambulatory Visit: Payer: Self-pay | Admitting: Family Medicine

## 2016-11-30 ENCOUNTER — Other Ambulatory Visit: Payer: Self-pay

## 2016-11-30 DIAGNOSIS — Z114 Encounter for screening for human immunodeficiency virus [HIV]: Secondary | ICD-10-CM

## 2016-11-30 DIAGNOSIS — K219 Gastro-esophageal reflux disease without esophagitis: Secondary | ICD-10-CM

## 2016-11-30 DIAGNOSIS — I10 Essential (primary) hypertension: Secondary | ICD-10-CM

## 2017-01-09 ENCOUNTER — Encounter (HOSPITAL_COMMUNITY): Payer: Self-pay | Admitting: Emergency Medicine

## 2017-01-09 ENCOUNTER — Emergency Department (HOSPITAL_COMMUNITY)
Admission: EM | Admit: 2017-01-09 | Discharge: 2017-01-09 | Disposition: A | Discharge status: Home / Self Care | Payer: BC Managed Care – PPO | Attending: Emergency Medicine | Admitting: Emergency Medicine

## 2017-01-09 DIAGNOSIS — R42 Dizziness and giddiness: Secondary | ICD-10-CM | POA: Diagnosis present

## 2017-01-09 DIAGNOSIS — F419 Anxiety disorder, unspecified: Secondary | ICD-10-CM | POA: Insufficient documentation

## 2017-01-09 DIAGNOSIS — Z563 Stressful work schedule: Secondary | ICD-10-CM | POA: Insufficient documentation

## 2017-01-09 DIAGNOSIS — I10 Essential (primary) hypertension: Secondary | ICD-10-CM | POA: Insufficient documentation

## 2017-01-09 DIAGNOSIS — Z566 Other physical and mental strain related to work: Secondary | ICD-10-CM

## 2017-01-09 DIAGNOSIS — Z79899 Other long term (current) drug therapy: Secondary | ICD-10-CM | POA: Insufficient documentation

## 2017-01-09 LAB — CBC
HEMATOCRIT: 49.8 % (ref 39.0–52.0)
Hemoglobin: 17.1 g/dL — ABNORMAL HIGH (ref 13.0–17.0)
MCH: 30.1 pg (ref 26.0–34.0)
MCHC: 34.3 g/dL (ref 30.0–36.0)
MCV: 87.7 fL (ref 78.0–100.0)
PLATELETS: 211 10*3/uL (ref 150–400)
RBC: 5.68 MIL/uL (ref 4.22–5.81)
RDW: 12.8 % (ref 11.5–15.5)
WBC: 6.3 10*3/uL (ref 4.0–10.5)

## 2017-01-09 LAB — BASIC METABOLIC PANEL
Anion gap: 10 (ref 5–15)
BUN: 16 mg/dL (ref 6–20)
CHLORIDE: 104 mmol/L (ref 101–111)
CO2: 24 mmol/L (ref 22–32)
CREATININE: 1.09 mg/dL (ref 0.61–1.24)
Calcium: 9.5 mg/dL (ref 8.9–10.3)
GFR calc Af Amer: >60 mL/min (ref >60)
GFR calc non Af Amer: >60 mL/min (ref >60)
GLUCOSE: 106 mg/dL — AB (ref 65–99)
Potassium: 3.7 mmol/L (ref 3.5–5.1)
SODIUM: 138 mmol/L (ref 135–145)

## 2017-01-09 LAB — I-STAT TROPONIN, ED: Troponin i, poc: 0 ng/mL (ref 0.00–0.08)

## 2017-01-09 NOTE — ED Notes (Signed)
Pt experienced syncopal episode after having blood work drawn, lasted approximately 15 seconds. Pt awakened diaphoretic and pale, BP 78/57. A/o x4. Will monitor.

## 2017-01-09 NOTE — Discharge Instructions (Signed)
Please follow up with your doctor about your visit today Return if you are worsening

## 2017-01-09 NOTE — ED Triage Notes (Signed)
Pt to ER reports hypertension and dizziness this morning. States had a nightmare this morning and woke up dizzy this morning. States pain in jaw, and tingling in bilateral fingers. BP this morning states is "extremely high" for him, with 168/102. Pt concerned he is having a "cardiovascular event", denies CP, denies SOB. States is a high stress time at work for him.

## 2017-01-09 NOTE — ED Provider Notes (Signed)
[facility] Provider Note   CSN: [id number] Arrival date & time: 01/09/17  0702     History   Chief Complaint Chief Complaint  Patient presents with  . Hypertension    HPI Brendan Neal is a 51 y.o. male who presents with dizziness, fatigue/weakness, stress, and a near syncopal episode. PMH significant for HTN, allergies, migraines.  The patient states that he has had some mild dizziness that started yesterday.  He went to sleep last night and then had a nightmare which woke him up this morning and when he sat up he felt extremely dizzy.  Patient states that he does not feel like he is going to pass out or like the room is spinning.  He says it primarily feels like being "off balance".  The feeling is worse with movement and better with rest. His husband checked his blood pressure and it was in the 140s over 100s so they became very worried and came to the ED.  When they were in the ED she is blood pressure was as high as 168/114.  During the blood draw the patient had a near syncopal episode vasovagal and his blood pressure was noted to be 78/57 at that time.  Patient states he did not pass out but did feel diaphoretic and weak.  Since he has been in the waiting room he states he just feels fatigued.  The dizziness has subsided.  Patient denies any headache, chest pain, shortness of breath, unilateral weakness, vision changes, falls.  Additionally the patient endorses extreme stress at his place of work.  He works in [employer] for a E[employer] and has recently taking on more duties at work without compensation and has been overlooked for a promotion.  States this is because of a lot of anxiety and stress about his job for several months and thinks this may be contributing to his symptoms.  HPI  Past Medical History:  Diagnosis Date  . Genital warts   . Hypertension     Patient Active Problem List   Diagnosis Date Noted  . Obese 09/23/2015    . Cervico-occipital neuralgia of left side 07/08/2015  . Seborrheic dermatitis 07/02/2015  . HTN (hypertension) 09/11/2014  . Snores 09/11/2014  . Situational anxiety 09/11/2014  . Sinusitis, chronic 09/11/2014  . GERD (gastroesophageal reflux disease) 09/11/2014    Past Surgical History:  Procedure Laterality Date  . TONSILLECTOMY    . WISDOM TOOTH EXTRACTION         Home Medications    Prior to Admission medications   Medication Sig Start Date End Date Taking? Authorizing Provider  albuterol (PROVENTIL HFA;VENTOLIN HFA) 108 (90 Base) MCG/ACT inhaler Inhale 2 puffs into the lungs every 6 (six) hours as needed for wheezing or shortness of breath. 08/10/16   Gregor Hams, MD  Azelastine-Fluticasone [facility]) 137-50 MCG/ACT SUSP Use one spray in each nostril twice daily. 09/12/16   Kozlow, Donnamarie Poag, MD  Calcium Carbonate Antacid (TUMS PO) Take by mouth daily as needed.     [provider]  fluticasone (FLONASE) 50 MCG/ACT nasal spray Place 1 spray into both nostrils daily as needed for allergies or rhinitis.    [provider]  hydrochlorothiazide (HYDRODIURIL) 12.5 MG tablet Take 1 tablet (12.5 mg total) by mouth daily. 09/01/16   Gregor Hams, MD  loratadine (CLARITIN) 10 MG tablet Take 10 mg by mouth daily as needed.     [provider]  montelukast (SINGULAIR) 10  MG tablet Take 1 tablet (10 mg total) by mouth at bedtime. 08/10/16   Gregor Hams, MD  Multiple Vitamin (MULTIVITAMIN) tablet Take 1 tablet by mouth daily.    [provider]  ranitidine (ZANTAC) 300 MG tablet TAKE 1 TABLET(300 MG) BY MOUTH TWICE DAILY 09/11/16   Gregor Hams, MD  rizatriptan (MAXALT-MLT) 5 MG disintegrating tablet TAKE 1 TABLET BY MOUTH EVERY 2 HOURS. NEED FOLLOW UP APT FOR MORE REFILLS 05/08/16   Gregor Hams, MD  Simethicone (GAS-X PO) Take by mouth as needed.    [provider]  triamcinolone cream (KENALOG) 0.5 % Apply 1 application topically 2 (two) times  daily. To affected areas. 07/02/15   Silverio Decamp, MD    Family History Family History  Problem Relation Age of Onset  . Alcohol abuse Brother   . Depression Brother   . Diabetes Maternal Aunt   . High blood pressure Mother   . Migraines Father   . High blood pressure Father   . High blood pressure Maternal Grandmother   . High blood pressure Maternal Grandfather   . High blood pressure Paternal Grandmother   . Diabetes Paternal Grandfather   . High blood pressure Paternal Grandfather     Social History Social History   Tobacco Use  . Smoking status: Never Smoker  . Smokeless tobacco: Never Used  Substance Use Topics  . Alcohol use: No  . Drug use: No     Allergies   Patient has no known allergies.   Review of Systems Review of Systems  Constitutional: Positive for fatigue. Negative for chills and fever.  Eyes: Negative for visual disturbance.  Respiratory: Negative for shortness of breath.   Cardiovascular: Negative for chest pain.  Gastrointestinal: Negative for abdominal pain.  Genitourinary: Negative for difficulty urinating.  Musculoskeletal: Negative for gait problem.  Neurological: Positive for dizziness and weakness (generalized). Negative for syncope and light-headedness.  Psychiatric/Behavioral: Positive for dysphoric mood and sleep disturbance. Negative for confusion. The patient is nervous/anxious.      Physical Exam Updated Vital Signs BP (!) 128/91 (BP Location: Right Arm)   Pulse 97   Temp 98.5 F (36.9 C) (Oral)   Resp 18   SpO2 100%   Physical Exam  Constitutional: He is oriented to person, place, and time. He appears well-developed and well-nourished. No distress.  HENT:  Head: Normocephalic and atraumatic.  Eyes: Conjunctivae are normal. Pupils are equal, round, and reactive to light. Right eye exhibits no discharge. Left eye exhibits no discharge. No scleral icterus.  Neck: Normal range of motion.  Cardiovascular: Normal  rate and regular rhythm. Exam reveals no gallop and no friction rub.  No murmur heard. No carotid bruits  Pulmonary/Chest: Effort normal and breath sounds normal. No stridor. No respiratory distress. He has no wheezes. He has no rales. He exhibits no tenderness.  Abdominal: He exhibits no distension.  Neurological: He is alert and oriented to person, place, and time.  Mental Status:  Alert, oriented, thought content appropriate, able to give a coherent history. Speech fluent without evidence of aphasia. Able to follow 2 step commands without difficulty.  Cranial Nerves:  II:  Peripheral visual fields grossly normal, pupils equal, round, reactive to light III,IV, VI: ptosis not present, extra-ocular motions intact bilaterally. Mild nystagmus noted when looking to the right V,VII: smile symmetric, facial light touch sensation equal VIII: hearing grossly normal to voice  X: uvula elevates symmetrically  XI: bilateral shoulder shrug symmetric and strong  XII: midline tongue extension without fassiculations Motor:  Normal tone. 5/5 in upper and lower extremities bilaterally including strong and equal grip strength and dorsiflexion/plantar flexion Sensory: Pinprick and light touch normal in all extremities.  Cerebellar: normal finger-to-nose with bilateral upper extremities Gait: normal gait and balance CV: distal pulses palpable throughout    Skin: Skin is warm and dry.  Psychiatric: He has a normal mood and affect. His behavior is normal.  Nursing note and vitals reviewed.    ED Treatments / Results  Labs (all labs ordered are listed, but only abnormal results are displayed) Labs Reviewed  BASIC METABOLIC PANEL - Abnormal; Notable for the following components:      Result Value   Glucose, Bld 106 (*)    All other components within normal limits  CBC - Abnormal; Notable for the following components:   Hemoglobin 17.1 (*)    All other components within normal limits  I-STAT TROPONIN,  ED    EKG  EKG Interpretation None       Radiology No results found.  Procedures Procedures (including critical care time)  Medications Ordered in ED Medications - No data to display   Initial Impression / Assessment and Plan / ED Course  I have reviewed the triage vital signs and the nursing notes.  Pertinent labs & imaging results that were available during my care of the patient were reviewed by me and considered in my medical decision making (see chart for details).  51 year old male presents with dizziness, fatigue, stress.  He is hypertensive but otherwise vital signs are normal.  EKG is normal sinus rhythm with a prolonged QT.  Labs are unremarkable.  Troponin is 0.  Neuro exam is unremarkable.  He did have a near syncopal event in triage during a blood draw.  This is likely vasovagal since he had a corresponding drop in his blood pressure at that time.  I think his symptoms overall are most likely due to increased stress from his job which is causing anxiety and possible panic attack this morning.  His rise in blood pressure was purely incidental since he was very anxious when he took it this morning.  I advised him to rest, eat well, exercise and follow-up with his doctor regarding his symptoms.  I do not see any need for initiation of any medicines at this point.  Final Clinical Impressions(s) / ED Diagnoses   Final diagnoses:  Hypertension, unspecified type  Dizziness  Stress at work    ED Discharge Orders    None       Recardo Evangelist, Hershal Coria 01/09/17 1242    Mabe, Forbes Cellar, MD 01/09/17 1247

## 2017-01-18 ENCOUNTER — Encounter: Payer: Self-pay | Admitting: Family Medicine

## 2017-01-26 ENCOUNTER — Ambulatory Visit: Payer: BC Managed Care – PPO | Admitting: Family Medicine

## 2017-01-26 ENCOUNTER — Encounter: Payer: Self-pay | Admitting: Family Medicine

## 2017-01-26 VITALS — BP 122/88 | HR 106 | Ht 73.5 in | Wt 248.0 lb

## 2017-01-26 DIAGNOSIS — I10 Essential (primary) hypertension: Secondary | ICD-10-CM

## 2017-01-26 MED ORDER — RIZATRIPTAN BENZOATE 5 MG PO TBDP: ORAL_TABLET | ORAL | 6 refills | Status: DC

## 2017-01-26 MED ORDER — METOPROLOL TARTRATE 25 MG PO TABS: 25.0000 mg | ORAL_TABLET | Freq: Two times a day (BID) | ORAL | 0 refills | Status: DC | PRN

## 2017-01-26 NOTE — Progress Notes (Signed)
Brendan Neal is a 51 y.o. male who presents to [facility] [place] today for follow up HTN.   Brendan Neal was seen in the emergency department on November 20 for an episode of hypertension associated with dizziness.  He had a workup and was eventually sent home.  Since then he is feeling much better and currently takes hydrochlorothiazide 12.5 mg daily.  He denies chest pain or palpitations.  He notes that he has increased his exercise and used a lower salt diet and feels a lot better.  He is also working to adjust his work life balance to have less stress.    Past Medical History:  Diagnosis Date  . Genital warts   . Hypertension    Past Surgical History:  Procedure Laterality Date  . TONSILLECTOMY    . WISDOM TOOTH EXTRACTION     Social History   Tobacco Use  . Smoking status: Never Smoker  . Smokeless tobacco: Never Used  Substance Use Topics  . Alcohol use: No   family history includes Alcohol abuse in his brother; Depression in his brother; Diabetes in his maternal aunt and paternal grandfather; High blood pressure in his father, maternal grandfather, maternal grandmother, mother, paternal grandfather, and paternal grandmother; Migraines in his father.  ROS as above:  Medications: Current Outpatient Medications  Medication Sig Dispense Refill  . Calcium Carbonate Antacid (TUMS PO) Take by mouth daily as needed.     . fluticasone (FLONASE) 50 MCG/ACT nasal spray Place 1 spray into both nostrils daily as needed for allergies or rhinitis.    . hydrochlorothiazide (HYDRODIURIL) 12.5 MG tablet Take 1 tablet (12.5 mg total) by mouth daily. 90 tablet 3  . loratadine (CLARITIN) 10 MG tablet Take 10 mg by mouth daily as needed.     . montelukast (SINGULAIR) 10 MG tablet Take 1 tablet (10 mg total) by mouth at bedtime. 30 tablet 3  . Multiple Vitamin (MULTIVITAMIN) tablet Take 1  tablet by mouth daily.    . ranitidine (ZANTAC) 300 MG tablet TAKE 1 TABLET(300 MG) BY MOUTH TWICE DAILY 180 tablet 1  . rizatriptan (MAXALT-MLT) 5 MG disintegrating tablet TAKE 1 TABLET BY MOUTH EVERY 2 HOURS. NEED FOLLOW UP APT FOR MORE REFILLS 10 tablet 3  . Simethicone (GAS-X PO) Take by mouth as needed.    . triamcinolone cream (KENALOG) 0.5 % Apply 1 application topically 2 (two) times daily. To affected areas. 30 g 3  . albuterol (PROVENTIL HFA;VENTOLIN HFA) 108 (90 Base) MCG/ACT inhaler Inhale 2 puffs into the lungs every 6 (six) hours as needed for wheezing or shortness of breath. (Patient not taking: Reported on 01/26/2017) 1 Inhaler 0  . Azelastine-Fluticasone (DYMISTA) 137-50 MCG/ACT SUSP Use one spray in each nostril twice daily. (Patient not taking: Reported on 01/26/2017) 1 Bottle 5   No current facility-administered medications for this visit.    No Known Allergies  Health Maintenance Health Maintenance  Topic Date Due  . HIV Screening  05/20/1982  . INFLUENZA VACCINE  09/28/2017 (Originally 09/20/2016)  . TETANUS/TDAP  09/22/2025     Exam:  BP 122/88   Pulse (!) 106   Ht 6' 1.5" (1.867 m)   Wt 248 lb (112.5 kg)   BMI 32.28 kg/m   Wt Readings from Last 5 Encounters:  01/26/17 248 lb (112.5 kg)  09/12/16 246 lb 6.4 oz (111.8 kg)  09/01/16 246 lb (111.6 kg)  08/10/16 244 lb (110.7 kg)  09/23/15  242 lb (109.8 kg)    Gen: Well NAD HEENT: EOMI,  MMM Lungs: Normal work of breathing. CTABL Heart: RRR no MRG Abd: NABS, Soft. Nondistended, Nontender Exts: Brisk capillary refill, warm and well perfused.    No results found for this or any previous visit (from the past 72 hour(s)). No results found.    Assessment and Plan: 51 y.o. male with hypertension: Blood pressure at goal today.  Patient had an episode of significantly increased blood pressure associated with anxiety.  I think he do well with continued diet and exercise hydrochlorothiazide.  I have prescribed  metoprolol that he can take if he has an episode where his blood pressure is going up in the future.  I also encouraged him to get fasting labs that were ordered in October and follow-up in a few months.   No orders of the defined types were placed in this encounter.  No orders of the defined types were placed in this encounter.    Discussed warning signs or symptoms. Please see discharge instructions. Patient expresses understanding.  I spent 25 minutes with this patient, greater than 50% was face-to-face time counseling regarding ddx and treatment plan.

## 2017-01-26 NOTE — Patient Instructions (Addendum)
Thank you for coming in today. Continue HCTZ.  Take metoprolol twice daily as needed for episodes of elevated blood pressure.  Recheck in 3 months.   Metoprolol tablets What is this medicine? METOPROLOL (me TOE proe lole) is a beta-blocker. Beta-blockers reduce the workload on the heart and help it to beat more regularly. This medicine is used to treat high blood pressure and to prevent chest pain. It is also used to after a heart attack and to prevent an additional heart attack from occurring. This medicine may be used for other purposes; ask your health care provider or pharmacist if you have questions. COMMON BRAND NAME(S): Lopressor What should I tell my health care provider before I take this medicine? They need to know if you have any of these conditions: -diabetes -heart or vessel disease like slow heart rate, worsening heart failure, heart block, sick sinus syndrome or Raynaud's disease -kidney disease -liver disease -lung or breathing disease, like asthma or emphysema -pheochromocytoma -thyroid disease -an unusual or allergic reaction to metoprolol, other beta-blockers, medicines, foods, dyes, or preservatives -pregnant or trying to get pregnant -breast-feeding How should I use this medicine? Take this medicine by mouth with a drink of water. Follow the directions on the prescription label. Take this medicine immediately after meals. Take your doses at regular intervals. Do not take more medicine than directed. Do not stop taking this medicine suddenly. This could lead to serious heart-related effects. Talk to your pediatrician regarding the use of this medicine in children. Special care may be needed. Overdosage: If you think you have taken too much of this medicine contact a poison control center or emergency room at once. NOTE: This medicine is only for you. Do not share this medicine with others. What if I miss a dose? If you miss a dose, take it as soon as you can. If it is  almost time for your next dose, take only that dose. Do not take double or extra doses. What may interact with this medicine? This medicine may interact with the following medications: -certain medicines for blood pressure, heart disease, irregular heart beat -certain medicines for depression like monoamine oxidase (MAO) inhibitors, fluoxetine, or paroxetine -clonidine -dobutamine -epinephrine -isoproterenol -reserpine This list may not describe all possible interactions. Give your health care provider a list of all the medicines, herbs, non-prescription drugs, or dietary supplements you use. Also tell them if you smoke, drink alcohol, or use illegal drugs. Some items may interact with your medicine. What should I watch for while using this medicine? Visit your doctor or health care professional for regular check ups. Contact your doctor right away if your symptoms worsen. Check your blood pressure and pulse rate regularly. Ask your health care professional what your blood pressure and pulse rate should be, and when you should contact them. You may get drowsy or dizzy. Do not drive, use machinery, or do anything that needs mental alertness until you know how this medicine affects you. Do not sit or stand up quickly, especially if you are an older patient. This reduces the risk of dizzy or fainting spells. Contact your doctor if these symptoms continue. Alcohol may interfere with the effect of this medicine. Avoid alcoholic drinks. What side effects may I notice from receiving this medicine? Side effects that you should report to your doctor or health care professional as soon as possible: -allergic reactions like skin rash, itching or hives -cold or numb hands or feet -depression -difficulty breathing -faint -fever with sore throat -  irregular heartbeat, chest pain -rapid weight gain -swollen legs or ankles Side effects that usually do not require medical attention (report to your doctor or  health care professional if they continue or are bothersome): -anxiety or nervousness -change in sex drive or performance -dry skin -headache -nightmares or trouble sleeping -short term memory loss -stomach upset or diarrhea -unusually tired This list may not describe all possible side effects. Call your doctor for medical advice about side effects. You may report side effects to FDA at 1-800-FDA-1088. Where should I keep my medicine? Keep out of the reach of children. Store at room temperature between 15 and 30 degrees C (59 and 86 degrees F). Throw away any unused medicine after the expiration date. NOTE: This sheet is a summary. It may not cover all possible information. If you have questions about this medicine, talk to your doctor, pharmacist, or health care provider.  2018 [employer]vier/Gold Standard (2012-10-11 14:40:36)

## 2017-02-08 ENCOUNTER — Other Ambulatory Visit: Payer: Self-pay | Admitting: Family Medicine

## 2017-02-08 DIAGNOSIS — K219 Gastro-esophageal reflux disease without esophagitis: Secondary | ICD-10-CM

## 2017-04-28 ENCOUNTER — Encounter: Payer: Self-pay | Admitting: Emergency Medicine

## 2017-04-28 ENCOUNTER — Emergency Department
Admission: EM | Admit: 2017-04-28 | Discharge: 2017-04-28 | Disposition: A | Discharge status: Home / Self Care | Payer: BC Managed Care – PPO | Source: Home / Self Care | Attending: Emergency Medicine | Admitting: Emergency Medicine

## 2017-04-28 DIAGNOSIS — R6889 Other general symptoms and signs: Secondary | ICD-10-CM | POA: Diagnosis not present

## 2017-04-28 LAB — POCT INFLUENZA A/B
Influenza A, POC: NEGATIVE
Influenza B, POC: NEGATIVE

## 2017-04-28 LAB — POCT RAPID STREP A (OFFICE): Rapid Strep A Screen: NEGATIVE

## 2017-04-28 MED ORDER — AZELASTINE-FLUTICASONE 137-50 MCG/ACT NA SUSP: NASAL | 5 refills | Status: DC

## 2017-04-28 NOTE — Discharge Instructions (Addendum)
Monitor blood pressure at home. Use dymesta

## 2017-04-28 NOTE — ED Triage Notes (Signed)
Patient presents to [facility] with C/O flu like symptoms since Wednesday evening. Sore throat, cough headache, body aches, nasal congestion, facial pressure and sinus drainage.

## 2017-04-28 NOTE — ED Provider Notes (Addendum)
[name]    CSN: [id number] Arrival date & time: 04/28/17  0917     History   Chief Complaint Chief Complaint  Patient presents with  . Influenza    HPI Brendan Neal is a 52 y.o. male.  Patient is a 75-year-o male who presents with onset midday Wednesday of a severe sore throat. He did not take his temperature but feels he was running a fever.e did not have a flu shot this year. He has had aching in his  And back. No cough. His husband is also sick. HPI  Past Medical History:  Diagnosis Date  . Genital warts   . Hypertension     Patient Active Problem List   Diagnosis Date Noted  . Obese 09/23/2015  . Cervico-occipital neuralgia of left side 07/08/2015  . Seborrheic dermatitis 07/02/2015  . HTN (hypertension) 09/11/2014  . Snores 09/11/2014  . Situational anxiety 09/11/2014  . Sinusitis, chronic 09/11/2014  . GERD (gastroesophageal reflux disease) 09/11/2014    Past Surgical History:  Procedure Laterality Date  . TONSILLECTOMY    . WISDOM TOOTH EXTRACTION         Home Medications    Prior to Admission medications   Medication Sig Start Date End Date Taking? Authorizing Provider  albuterol (PROVENTIL HFA;VENTOLIN HFA) 108 (90 Base) MCG/ACT inhaler Inhale 2 puffs into the lungs every 6 (six) hours as needed for wheezing or shortness of breath. Patient not taking: Reported on 01/26/2017 08/10/16   Gregor Hams, MD  Azelastine-Fluticasone [facility]) [phone] MCG/ACT SUSP Use one spray in each nostril twice daily. 04/28/17   Darlyne Russian, MD  Calcium Carbonate Antacid (TUMS PO) Take by mouth daily as needed.     [provider]  fluticasone (FLONASE) 50 MCG/ACT nasal spray Place 1 spray into both nostrils daily as needed for allergies or rhinitis.    [provider]  hydrochlorothiazide (HYDRODIURIL) 12.5 MG tablet Take 1 tablet (12.5 mg total) by mouth daily. 09/01/16   Gregor Hams, MD  loratadine (CLARITIN) 10 MG tablet Take 10 mg by  mouth daily as needed.     [provider]  metoprolol tartrate (LOPRESSOR) 25 MG tablet Take 1 tablet (25 mg total) by mouth 2 (two) times daily as needed (hypertensive episode). 01/26/17   Gregor Hams, MD  montelukast (SINGULAIR) 10 MG tablet Take 1 tablet (10 mg total) by mouth at bedtime. 08/10/16   Gregor Hams, MD  Multiple Vitamin (MULTIVITAMIN) tablet Take 1 tablet by mouth daily.    [provider]  ranitidine (ZANTAC) 300 MG tablet TAKE 1 TABLET(300 MG) BY MOUTH TWICE DAILY 09/11/16   Gregor Hams, MD  ranitidine (ZANTAC) 300 MG tablet TAKE 1 TABLET(300 MG) BY MOUTH TWICE DAILY 02/08/17   Gregor Hams, MD  rizatriptan (MAXALT-MLT) 5 MG disintegrating tablet TAKE 1 TABLET BY MOUTH EVERY 2 HOURS. 01/26/17   Gregor Hams, MD  Simethicone (GAS-X PO) Take by mouth as needed.    [provider]  triamcinolone cream (KENALOG) 0.5 % Apply 1 application topically 2 (two) times daily. To affected areas. 07/02/15   Silverio Decamp, MD    Family History Family History  Problem Relation Age of Onset  . Alcohol abuse Brother   . Depression Brother   . Diabetes Maternal Aunt   . High blood pressure Mother   . Migraines Father   . High blood pressure Father   . High blood pressure Maternal Grandmother   .  High blood pressure Maternal Grandfather   . High blood pressure Paternal Grandmother   . Diabetes Paternal Grandfather   . High blood pressure Paternal Grandfather     Social History Social History   Tobacco Use  . Smoking status: Never Smoker  . Smokeless tobacco: Never Used  Substance Use Topics  . Alcohol use: No  . Drug use: No     Allergies   Patient has no known allergies.   Review of Systems Review of Systems  Constitutional: Positive for chills, fatigue and fever.  HENT: Positive for congestion, postnasal drip and sore throat.   Eyes: Negative.   Respiratory: Negative.   Cardiovascular: Negative.   Gastrointestinal: Negative.        Physical Exam Triage Vital Signs ED Triage Vitals  Enc Vitals Group     BP 04/28/17 0943 (!) 146/105     Pulse Rate 04/28/17 0943 (!) 113     Resp 04/28/17 0943 16     Temp 04/28/17 0943 98 F (36.7 C)     Temp Source 04/28/17 0943 Oral     SpO2 04/28/17 0943 96 %     Weight 04/28/17 0944 245 lb (111.1 kg)     Height 04/28/17 0944 6' 1.5" (1.867 m)     Head Circumference --      Peak Flow --      Pain Score 04/28/17 0944 3     Pain Loc --      Pain Edu? --      Excl. in [place]? --    No data found.  Updated Vital Signs BP (!) 146/105 (BP Location: Right Arm)   Pulse (!) 113   Temp 98 F (36.7 C) (Oral)   Resp 16   Ht 6' 1.5" (1.867 m)   Wt 245 lb (111.1 kg)   SpO2 96%   BMI 31.89 kg/m   Visual Acuity Right Eye Distance:   Left Eye Distance:   Bilateral Distance:    Right Eye Near:   Left Eye Near:    Bilateral Near:     Physical Exam  Constitutional: He appears well-developed and well-nourished.  HENT:  Head: Normocephalic and atraumatic.  Mouth/Throat: No oropharyngeal exudate.  The nose is congested.  Eyes: Pupils are equal, round, and reactive to light.  Neck: Normal range of motion. Neck supple.  Cardiovascular: Normal rate and regular rhythm.  Pulmonary/Chest: Effort normal and breath sounds normal. No respiratory distress.     UC Treatments / Results  Labs (all labs ordered are listed, but only abnormal results are displayed) Labs Reviewed - No data to display  EKG  EKG Interpretation None       Radiology No results found.  Procedures Procedures (including critical care time)  Medications Ordered in UC Medications - No data to display   Initial Impression / Assessment and Plan / UC Course  I have reviewed the triage vital signs and the nursing notes.  Pertinent labs & imaging results that were available during my care of the patient were reviewed by me and considered in my medical decision making (see chart for details).        Final Clinical Impressions(s) / UC Diagnoses   Final diagnoses:  Flu-like symptoms    ED Discharge Orders        Ordered    Azelastine-Fluticasone (DYMISTA) 137-50 MCG/ACT SUSP     04/28/17 1027       Controlled Substance Prescriptions [place] Controlled Substance Registry consulted? Not Applicable  Darlyne Russian, MD 04/28/17 1017    Darlyne Russian, MD 04/28/17 1019    Darlyne Russian, MD 04/28/17 1030

## 2017-04-29 ENCOUNTER — Encounter: Payer: Self-pay | Admitting: Emergency Medicine

## 2017-04-29 ENCOUNTER — Other Ambulatory Visit: Payer: Self-pay

## 2017-04-29 ENCOUNTER — Emergency Department (INDEPENDENT_AMBULATORY_CARE_PROVIDER_SITE_OTHER): Payer: BC Managed Care – PPO

## 2017-04-29 ENCOUNTER — Emergency Department
Admission: EM | Admit: 2017-04-29 | Discharge: 2017-04-29 | Disposition: A | Discharge status: Home / Self Care | Payer: BC Managed Care – PPO | Source: Home / Self Care | Attending: Emergency Medicine | Admitting: Emergency Medicine

## 2017-04-29 DIAGNOSIS — R058 Other specified cough: Secondary | ICD-10-CM

## 2017-04-29 DIAGNOSIS — R05 Cough: Secondary | ICD-10-CM

## 2017-04-29 DIAGNOSIS — J209 Acute bronchitis, unspecified: Secondary | ICD-10-CM

## 2017-04-29 DIAGNOSIS — J111 Influenza due to unidentified influenza virus with other respiratory manifestations: Secondary | ICD-10-CM

## 2017-04-29 MED ORDER — ONDANSETRON 4 MG PO TBDP: 4.0000 mg | ORAL_TABLET | Freq: Three times a day (TID) | ORAL | 0 refills | Status: DC | PRN

## 2017-04-29 MED ORDER — AZITHROMYCIN 250 MG PO TABS: ORAL_TABLET | ORAL | 0 refills | Status: DC

## 2017-04-29 MED ORDER — PREDNISONE 50 MG PO TABS: ORAL_TABLET | ORAL | 0 refills | Status: DC

## 2017-04-29 MED ORDER — ONDANSETRON 4 MG PO TBDP
4.0000 mg | ORAL_TABLET | Freq: Once | ORAL | Status: AC
  Administered 2017-04-29: 4 mg via ORAL

## 2017-04-29 MED ORDER — KETOROLAC TROMETHAMINE 60 MG/2ML IM SOLN
60.0000 mg | Freq: Once | INTRAMUSCULAR | Status: AC
  Administered 2017-04-29: 60 mg via INTRAMUSCULAR

## 2017-04-29 NOTE — ED Triage Notes (Signed)
Reports worsening and addition of symptoms since evaluation here yesterday; primarily severe headache left occipital area with accompanying nausea and weakness.

## 2017-04-29 NOTE — ED Provider Notes (Addendum)
[facility]    CSN: [id number] Arrival date & time: 04/29/17  1424 Sunday    History   Chief Complaint Chief Complaint  Patient presents with  . Headache    HPI Brendan Neal is a 52 y.o. male.  Time seen by me,approximately 4:20 PM ( delay due to multiple emergencies seen before him) HPI 4 days ago started with flu symptoms. Seen yesterday here in urgent care by Dr. Everlene Farrier. Flu tests and strep test were negative then. Was prescribed Dymista Symptoms are worsening, fever, chills, severe myalgias, bilateral facial headaches especially left maxillary area. Denies posterior headache.no stiff neck. No focal neurologic symptoms Had severe nausea withou tvomiting. Here in urgent care, we gave him Zofran 4 mg po and nausea improved 30 minutes later. Complains of worsening chest congestion,cough, productive of yellow sputum. No definite shortness of breath or wheezing. No history of chronic lung disease, and he does have history of seasonal sinus allergies and migraines. He feels this acute illness has triggered a left sided migraine, and this is the side he tends to get the migraines in the past. Currently denies photophobia. He admits to a great deal of stress at work. He is here with his husband. He denies focal weakness although he feels weak in general, but no syncope or focal neuro symptoms. No visual symptoms. Denies chest pain or shortness of breath. No abdominal pain, but has had some loose stools 2 times yesterday without blood or mucus. Past Medical History:  Diagnosis Date  . Genital warts   . Hypertension     Patient Active Problem List   Diagnosis Date Noted  . Obese 09/23/2015  . Cervico-occipital neuralgia of left side 07/08/2015  . Seborrheic dermatitis 07/02/2015  . HTN (hypertension) 09/11/2014  . Snores 09/11/2014  . Situational anxiety 09/11/2014  . Sinusitis, chronic 09/11/2014  . GERD (gastroesophageal reflux disease) 09/11/2014    Past Surgical  History:  Procedure Laterality Date  . TONSILLECTOMY    . WISDOM TOOTH EXTRACTION         Home Medications    Prior to Admission medications   Medication Sig Start Date End Date Taking? Authorizing Provider  albuterol (PROVENTIL HFA;VENTOLIN HFA) 108 (90 Base) MCG/ACT inhaler Inhale 2 puffs into the lungs every 6 (six) hours as needed for wheezing or shortness of breath. Patient not taking: Reported on 01/26/2017 08/10/16   Gregor Hams, MD  Azelastine-Fluticasone [facility]) [phone] MCG/ACT SUSP Use one spray in each nostril twice daily. 04/28/17   Darlyne Russian, MD  azithromycin (ZITHROMAX Z-PAK) 250 MG tablet Take 2 tablets on day one, then 1 tablet daily on days 2 through 5 04/29/17   Jacqulyn Cane, MD  Calcium Carbonate Antacid (TUMS PO) Take by mouth daily as needed.     [provider]  fluticasone (FLONASE) 50 MCG/ACT nasal spray Place 1 spray into both nostrils daily as needed for allergies or rhinitis.    [provider]  hydrochlorothiazide (HYDRODIURIL) 12.5 MG tablet Take 1 tablet (12.5 mg total) by mouth daily. 09/01/16   Gregor Hams, MD  loratadine (CLARITIN) 10 MG tablet Take 10 mg by mouth daily as needed.     [provider]  metoprolol tartrate (LOPRESSOR) 25 MG tablet Take 1 tablet (25 mg total) by mouth 2 (two) times daily as needed (hypertensive episode). 01/26/17   Gregor Hams, MD  montelukast (SINGULAIR) 10 MG tablet Take 1 tablet (10 mg total) by mouth at bedtime. 08/10/16   Georgina Snell,  Rebekah [place]field, MD  Multiple Vitamin (MULTIVITAMIN) tablet Take 1 tablet by mouth daily.    [provider]  ondansetron (ZOFRAN-ODT) 4 MG disintegrating tablet Take 1 tablet (4 mg total) by mouth every 8 (eight) hours as needed for nausea or vomiting. 04/29/17   Jacqulyn Cane, MD  predniSONE (DELTASONE) 50 MG tablet Take 1 by  mouth daily with food, For 5 days. 04/29/17   Jacqulyn Cane, MD  ranitidine (ZANTAC) 300 MG tablet TAKE 1 TABLET(300 MG) BY MOUTH TWICE DAILY  09/11/16   Gregor Hams, MD  ranitidine (ZANTAC) 300 MG tablet TAKE 1 TABLET(300 MG) BY MOUTH TWICE DAILY 02/08/17   Gregor Hams, MD  rizatriptan (MAXALT-MLT) 5 MG disintegrating tablet TAKE 1 TABLET BY MOUTH EVERY 2 HOURS. 01/26/17   Gregor Hams, MD  Simethicone (GAS-X PO) Take by mouth as needed.    [provider]  triamcinolone cream (KENALOG) 0.5 % Apply 1 application topically 2 (two) times daily. To affected areas. 07/02/15   Silverio Decamp, MD    Family History Family History  Problem Relation Age of Onset  . Alcohol abuse Brother   . Depression Brother   . Diabetes Maternal Aunt   . High blood pressure Mother   . Migraines Father   . High blood pressure Father   . High blood pressure Maternal Grandmother   . High blood pressure Maternal Grandfather   . High blood pressure Paternal Grandmother   . Diabetes Paternal Grandfather   . High blood pressure Paternal Grandfather     Social History Social History   Tobacco Use  . Smoking status: Never Smoker  . Smokeless tobacco: Never Used  Substance Use Topics  . Alcohol use: No  . Drug use: No     Allergies   Patient has no known allergies.   Review of Systems Review of Systems  All other systems reviewed and are negative.    Physical Exam Triage Vital Signs ED Triage Vitals  Enc Vitals Group     BP 04/29/17 1535 (!) 140/97     Pulse Rate 04/29/17 1535 81     Resp 04/29/17 1535 18     Temp 04/29/17 1535 98.6 F (37 C)     Temp Source 04/29/17 1535 Oral     SpO2 04/29/17 1535 97 %     Weight 04/29/17 1537 245 lb (111.1 kg)     Height 04/29/17 1537 6' 0.5" (1.842 m)     Head Circumference --      Peak Flow --      Pain Score 04/29/17 1536 4     Pain Loc --      Pain Edu? --      Excl. in [place]? --    No data found.  Updated Vital Signs BP (!) 140/97 (BP Location: Left Arm)   Pulse 81   Temp 98.6 F (37 C) (Oral)   Resp 18   Ht 6' 0.5" (1.842 m)   Wt 245 lb (111.1 kg)   SpO2  97%   BMI 32.77 kg/m   Visual Acuity Right Eye Distance:   Left Eye Distance:   Bilateral Distance:    Right Eye Near:   Left Eye Near:    Bilateral Near:     Physical Exam  Constitutional: He is oriented to person, place, and time. He appears well-developed and well-nourished.  Non-toxic appearance. He appears ill (very fatigued, but no cardiorespiratory distress). No distress.  HENT:  Head: Normocephalic  and atraumatic.  Right Ear: Tympanic membrane and external ear normal.  Left Ear: Tympanic membrane and external ear normal.  Nose: Rhinorrhea present.  Mouth/Throat: Mucous membranes are normal. Posterior oropharyngeal erythema (mild injection) present. No oropharyngeal exudate.  Some tenderness left maxillary sinus and left TMJ. No mastoid tenderness. No occipital tenderness. Neck supple,full range of motion  Eyes: Conjunctivae are normal. Right eye exhibits no discharge. Left eye exhibits no discharge. No scleral icterus.  Neck: Neck supple.  Cardiovascular: Normal rate, regular rhythm and normal heart sounds.  Pulmonary/Chest: No stridor. No respiratory distress. He has no wheezes.  There are mild bibasilar rales, especially on the left. Breath sounds equal bilaterally. Few anterior rhonchi heard. No wheezes.  Abdominal: Soft. He exhibits no distension. There is no tenderness.  Musculoskeletal: He exhibits no edema.  Lymphadenopathy:    He has cervical adenopathy (mild shoddy anterior cervical nodes).  Neurological: He is alert and oriented to person, place, and time. No cranial nerve deficit.  Skin: Skin is warm and intact. Capillary refill takes less than 2 seconds. No rash noted. He is diaphoretic.  Psychiatric: He has a normal mood and affect.  Nursing note and vitals reviewed.  4 :38 PM above initial evaluation performed by me. Toradol 60 mg IM ordered for pain relief. Patient agrees and consents. I explained we need to do chest x-ray to rule out pneumonia, and  he agrees and consents. Chest x-ray ordered  UC Treatments / Results  Labs (all labs ordered are listed, but only abnormal results are displayed) Labs Reviewed - No data to display  EKG  EKG Interpretation None       Radiology Dg Chest 2 View  Result Date: 04/29/2017 CLINICAL DATA:  Pt states that since Wednesday he has had a cough with nausea, headache, and body aches. Hx of htn. EXAM: CHEST - 2 VIEW COMPARISON:  08/10/2016 FINDINGS: The heart size and mediastinal contours are within normal limits. Both lungs are clear. No pleural effusion or pneumothorax. The visualized skeletal structures are unremarkable. IMPRESSION: No active cardiopulmonary disease. Electronically Signed   By: Lajean Manes M.D.   On: 04/29/2017 16:56    Procedures Procedures (including critical care time)  Medications Ordered in UC Medications  ondansetron (ZOFRAN-ODT) disintegrating tablet 4 mg (4 mg Oral Given 04/29/17 1645)  ketorolac (TORADOL) injection 60 mg (60 mg Intramuscular Given 04/29/17 1630)     Initial Impression / Assessment and Plan / UC Course  I have reviewed the triage vital signs and the nursing notes.  Pertinent labs & imaging results that were available during my care of the patient were reviewed by me and considered in my medical decision making (see chart for details).    Chest x-ray shows no active disease. No infiltrates. However, on physical exam,he has mild bibasilar crackles, which might suggestive of early pneumonia. certainly has at least acute bronchitis,I suspect possible bacterial cause. This might be secondary to influenza, classic symptoms started 4-5 days ago, but he is not a candidate for Tamiflu. Also, he likely has acute maxillary sinusitis with sinus type headache. This also might be triggering a flareup of his chronic intermittent migraines.( without focal neurologic symptoms) Discussed at length with patient and husband.Treatment options discussed, as well as  risks, benefits, alternatives.  They voiced understanding and agreement with the following plans: Zofran 4 mg by mouth, stat, nausea significantly improved. Toradol 60 mg IM stat, patient rechecked 20 minutes later and pain significantly improved.  Z-Pak Prednisone 50 mg daily  5 days Zofran ODT prn nausea. He declined prescription for pain med, and he prefers to use OTc ibuprofen when necessary pain. Push fluids and other symptomatic care discussed. Note written to excuse from work  Follow-up with your primary care doctor in 5-7 days if not improving, or sooner if symptoms become worse. Precautions discussed. Red flags discussed. Questions invited and answered. They voiced understanding and agreement.   Final Clinical Impressions(s) / UC Diagnoses   Final diagnoses:  Cough productive of yellow sputum  Acute bronchitis, unspecified organism  Influenza with respiratory manifestation    ED Discharge Orders        Ordered    azithromycin (ZITHROMAX Z-PAK) 250 MG tablet     04/29/17 1747    predniSONE (DELTASONE) 50 MG tablet     04/29/17 1747    ondansetron (ZOFRAN-ODT) 4 MG disintegrating tablet  Every 8 hours PRN     04/29/17 1747       Controlled Substance Prescriptions [place] Controlled Substance Registry consulted? Not Applicable   Jacqulyn Cane, MD 04/29/17 2240    Jacqulyn Cane, MD 04/29/17 2241

## 2017-05-03 ENCOUNTER — Encounter: Payer: Self-pay | Admitting: Family Medicine

## 2017-05-03 ENCOUNTER — Ambulatory Visit: Payer: BC Managed Care – PPO | Admitting: Family Medicine

## 2017-05-03 VITALS — BP 137/104 | HR 93 | Temp 98.3°F | Wt 245.0 lb

## 2017-05-03 DIAGNOSIS — R05 Cough: Secondary | ICD-10-CM | POA: Diagnosis not present

## 2017-05-03 DIAGNOSIS — R490 Dysphonia: Secondary | ICD-10-CM | POA: Diagnosis not present

## 2017-05-03 DIAGNOSIS — R058 Other specified cough: Secondary | ICD-10-CM

## 2017-05-03 MED ORDER — CEFDINIR 300 MG PO CAPS: 300.0000 mg | ORAL_CAPSULE | Freq: Two times a day (BID) | ORAL | 0 refills | Status: DC

## 2017-05-03 NOTE — Patient Instructions (Addendum)
Thank you for coming in today. Take MuccinexDM twice daily.  You back up is omnicef antibiotic twice daily if worse or not better.  Continue tylenol or aspirin for fever or chills.    Hoarseness Hoarseness is any abnormal change in your voice.Hoarseness can make it difficult to speak. Your voice may sound raspy, breathy, or strained. Hoarseness is caused by a problem with the vocal cords. The vocal cords are two bands of tissue inside your voice box (larynx). When you speak, your vocal cords move back and forth to create sound. The surfaces of your vocal cords need to be smooth for your voice to sound clear. Swelling or lumps on the vocal cords can cause hoarseness. Common causes of vocal cord problems include:  Upper airway infection.  A long-term cough.  Straining or overusing your voice.  Smoking.  Allergies.  Vocal cord growths.  Stomach acids that flow up from your stomach and irritate your vocal cords (gastroesophageal reflux).  Follow these instructions at home: Watch your condition for any changes. To ease any discomfort that you feel:  Rest your voice. Do not whisper. Whispering can cause muscle strain.  Do not speak in a loud or harsh voice that makes your hoarseness worse.  Do not use any tobacco products, including cigarettes, chewing tobacco, or electronic cigarettes. If you need help quitting, ask your health care provider.  Avoid secondhand smoke.  Do not eat foods that give you heartburn. Heartburn can make gastroesophageal reflux worse.  Do not drink coffee.  Do not drink alcohol.  Drink enough fluids to keep your urine clear or pale yellow.  Use a humidifier if the air in your home is dry.  Contact a health care provider if:  You have hoarseness that lasts longer than 3 weeks.  You almost lose or completelylose your voice for longer than 3 days.  You have pain when you swallow or try to talk.  You feel a lump in your neck. Get help right away  if:  You have trouble swallowing.  You feel as though you are choking when you swallow.  You cough up blood or vomit blood.  You have trouble breathing. This information is not intended to replace advice given to you by your health care provider. Make sure you discuss any questions you have with your health care provider. Document Released: 01/20/2005 Document Revised: 07/15/2015 Document Reviewed: 01/28/2014 Elsevier Interactive Patient Education  [employer].

## 2017-05-03 NOTE — Progress Notes (Signed)
Brendan Neal is a 52 y.o. male who presents to [facility] today for hoarse voice cough congestion sinus pain and pressure.  Keenan developed an illness last week with the above symptoms and was seen in urgent care twice over the weekend.  He is feeling a lot better now with no chest pain shortness of breath or wheezing.  He does note however he continued to hoarse voice and some sinus pressure.  He was prescribed azithromycin which he is currently taking.  He additionally was prescribed prednisone which he did not tolerate.  Chest ray was unremarkable.  Current symptoms are predominantly nighttime cough and hoarse voice.  He is tried some over-the-counter medications which help a little.  In the past Tessalon Perles have not helped.  He is reluctant to consider more antibiotics or opiates for cough suppression.   Past Medical History:  Diagnosis Date  . Genital warts   . Hypertension    Past Surgical History:  Procedure Laterality Date  . TONSILLECTOMY    . WISDOM TOOTH EXTRACTION     Social History   Tobacco Use  . Smoking status: Never Smoker  . Smokeless tobacco: Never Used  Substance Use Topics  . Alcohol use: No   family history includes Alcohol abuse in his brother; Depression in his brother; Diabetes in his maternal aunt and paternal grandfather; High blood pressure in his father, maternal grandfather, maternal grandmother, mother, paternal grandfather, and paternal grandmother; Migraines in his father.  ROS as above:  Medications: Current Outpatient Medications  Medication Sig Dispense Refill  . albuterol (PROVENTIL HFA;VENTOLIN HFA) 108 (90 Base) MCG/ACT inhaler Inhale 2 puffs into the lungs every 6 (six) hours as needed for wheezing or shortness of breath. 1 Inhaler 0  . Azelastine-Fluticasone (DYMISTA) 137-50 MCG/ACT SUSP Use one spray in each nostril  twice daily. 1 Bottle 5  . azithromycin (ZITHROMAX Z-PAK) 250 MG tablet Take 2 tablets on day one, then 1 tablet daily on days 2 through 5 1 each 0  . Calcium Carbonate Antacid (TUMS PO) Take by mouth daily as needed.     . fluticasone (FLONASE) 50 MCG/ACT nasal spray Place 1 spray into both nostrils daily as needed for allergies or rhinitis.    . hydrochlorothiazide (HYDRODIURIL) 12.5 MG tablet Take 1 tablet (12.5 mg total) by mouth daily. 90 tablet 3  . loratadine (CLARITIN) 10 MG tablet Take 10 mg by mouth daily as needed.     . metoprolol tartrate (LOPRESSOR) 25 MG tablet Take 1 tablet (25 mg total) by mouth 2 (two) times daily as needed (hypertensive episode). 60 tablet 0  . montelukast (SINGULAIR) 10 MG tablet Take 1 tablet (10 mg total) by mouth at bedtime. 30 tablet 3  . Multiple Vitamin (MULTIVITAMIN) tablet Take 1 tablet by mouth daily.    . ondansetron (ZOFRAN-ODT) 4 MG disintegrating tablet Take 1 tablet (4 mg total) by mouth every 8 (eight) hours as needed for nausea or vomiting. 8 tablet 0  . predniSONE (DELTASONE) 50 MG tablet Take 1 by  mouth daily with food, For 5 days. 5 tablet 0  . ranitidine (ZANTAC) 300 MG tablet TAKE 1 TABLET(300 MG) BY MOUTH TWICE DAILY 180 tablet 1  . ranitidine (ZANTAC) 300 MG tablet TAKE 1 TABLET(300 MG) BY MOUTH TWICE DAILY 180 tablet 0  . rizatriptan (MAXALT-MLT) 5 MG disintegrating tablet TAKE 1 TABLET BY MOUTH EVERY 2 HOURS. 10 tablet 6  . Simethicone (GAS-X  PO) Take by mouth as needed.    . triamcinolone cream (KENALOG) 0.5 % Apply 1 application topically 2 (two) times daily. To affected areas. 30 g 3  . cefdinir (OMNICEF) 300 MG capsule Take 1 capsule (300 mg total) by mouth 2 (two) times daily. 14 capsule 0   No current facility-administered medications for this visit.    No Known Allergies  Health Maintenance Health Maintenance  Topic Date Due  . HIV Screening  05/20/1982  . INFLUENZA VACCINE  09/28/2017 (Originally 09/20/2016)  .  TETANUS/TDAP  09/22/2025     Exam:  BP (!) 137/104   Pulse 93   Temp 98.3 F (36.8 C) (Oral)   Wt 245 lb (111.1 kg)   BMI 32.77 kg/m   . Wt Readings from Last 5 Encounters:  05/03/17 245 lb (111.1 kg)  04/29/17 245 lb (111.1 kg)  04/28/17 245 lb (111.1 kg)  01/26/17 248 lb (112.5 kg)  09/12/16 246 lb 6.4 oz (111.8 kg)    Gen: Well NAD HEENT: EOMI,  MMM clear nasal discharge with inflamed nasal turbinates bilaterally.  Tender to palpation maxillary sinus.  Mild cervical lymphadenopathy present.  Posterior pharynx with cobblestoning. Lungs: Normal work of breathing. CTABL Heart: RRR no MRG Abd: NABS, Soft. Nondistended, Nontender Exts: Brisk capillary refill, warm and well perfused.    No results found for this or any previous visit (from the past 72 hour(s)). No results found.    Assessment and Plan: 52 y.o. male with resolving viral illness with resulting post viral cough and hoarseness.  We had a lengthy discussion about expectations and treatment outcomes and options.  We finally settled on a printed backup prescription of Omnicef to take if he worsens.  We both agree that it is probably not necessary to take it currently but reasonable to have as a backup.  Additionally I do not see much point in prescribing Tessalon Perles as they have not been beneficial for him in the past.  He is reluctant consider opiates for cough suppression.  Recommend continued over-the-counter Mucinex DM sugar-free cough drops with menthol.  I expect that the hoarseness will last for a few more days and the cough possibly another week or 2.  Recheck PRN.    No orders of the defined types were placed in this encounter.  Meds ordered this encounter  Medications  . cefdinir (OMNICEF) 300 MG capsule    Sig: Take 1 capsule (300 mg total) by mouth 2 (two) times daily.    Dispense:  14 capsule    Refill:  0     Discussed warning signs or symptoms. Please see discharge instructions. Patient  expresses understanding.   I spent 25 minutes with this patient, greater than 50% was face-to-face time counseling regarding ddx and treatment plan.

## 2017-05-08 ENCOUNTER — Other Ambulatory Visit: Payer: Self-pay | Admitting: Family Medicine

## 2017-05-08 DIAGNOSIS — K219 Gastro-esophageal reflux disease without esophagitis: Secondary | ICD-10-CM

## 2017-06-06 ENCOUNTER — Other Ambulatory Visit: Payer: Self-pay | Admitting: Family Medicine

## 2017-06-06 DIAGNOSIS — K219 Gastro-esophageal reflux disease without esophagitis: Secondary | ICD-10-CM

## 2017-08-09 ENCOUNTER — Other Ambulatory Visit: Payer: Self-pay | Admitting: Family Medicine

## 2017-08-09 DIAGNOSIS — K219 Gastro-esophageal reflux disease without esophagitis: Secondary | ICD-10-CM

## 2017-10-23 ENCOUNTER — Other Ambulatory Visit: Payer: Self-pay | Admitting: Family Medicine

## 2017-11-26 ENCOUNTER — Other Ambulatory Visit: Payer: Self-pay | Admitting: Family Medicine

## 2017-11-26 DIAGNOSIS — K219 Gastro-esophageal reflux disease without esophagitis: Secondary | ICD-10-CM

## 2017-12-24 ENCOUNTER — Other Ambulatory Visit: Payer: Self-pay | Admitting: Family Medicine

## 2018-01-11 ENCOUNTER — Emergency Department
Admission: EM | Admit: 2018-01-11 | Discharge: 2018-01-11 | Disposition: A | Discharge status: Home / Self Care | Payer: BC Managed Care – PPO | Source: Home / Self Care

## 2018-01-11 ENCOUNTER — Encounter: Payer: Self-pay | Admitting: *Deleted

## 2018-01-11 DIAGNOSIS — J0100 Acute maxillary sinusitis, unspecified: Secondary | ICD-10-CM

## 2018-01-11 DIAGNOSIS — M26623 Arthralgia of bilateral temporomandibular joint: Secondary | ICD-10-CM | POA: Diagnosis not present

## 2018-01-11 DIAGNOSIS — J069 Acute upper respiratory infection, unspecified: Secondary | ICD-10-CM | POA: Diagnosis not present

## 2018-01-11 DIAGNOSIS — B9789 Other viral agents as the cause of diseases classified elsewhere: Secondary | ICD-10-CM | POA: Diagnosis not present

## 2018-01-11 HISTORY — DX: Gastro-esophageal reflux disease without esophagitis: K21.9

## 2018-01-11 MED ORDER — AMOXICILLIN 875 MG PO TABS: 875.0000 mg | ORAL_TABLET | Freq: Two times a day (BID) | ORAL | 0 refills | Status: DC

## 2018-01-11 NOTE — Discharge Instructions (Addendum)
Take plain guaifenesin (1200mg  extended release tabs such as Mucinex) twice daily, with plenty of water, for cough and congestion.  Get adequate rest.   May use Afrin nasal spray (or generic oxymetazoline) each morning for about 5 days and then discontinue.  Also recommend using saline nasal spray several times daily and saline nasal irrigation (AYR is a common brand).  Use Flonase nasal spray each morning after using Afrin nasal spray and saline nasal irrigation. May take Delsym Cough Suppressant at bedtime for nighttime cough.  Stop all antihistamines for now, and other non-prescription cough/cold preparations. May take Ibuprofen 200mg , 4 tabs every 8 hours with food for TMJ pain.  Apply ice pack to TMJ's for 15 to 20 minutes, 3 to 4 times daily  Continue until pain decreases.    Recommend a flu shot when well.

## 2018-01-11 NOTE — ED Triage Notes (Signed)
Facial pain, bilat ear pain and muted hearing, productive cough-yellow, x2 weeks.

## 2018-01-11 NOTE — ED Provider Notes (Signed)
[facility]    CSN: [id number] Arrival date & time: 01/11/18  0801     History   Chief Complaint Chief Complaint  Patient presents with  . Facial Pain  . Nasal Congestion  . Cough    HPI Brendan Neal is a 52 y.o. male.   Patient has a history of seasonal rhinitis which increased about a month ago, although he felt well.  About 1.5 weeks ago he began to feel more fatigued with onset of increased sinus congestion and a mild cough.  He denies sore throat, or fevers, chills, and sweats.  He has a long history of TMJ pain, which has also increased during the past 2 weeks.  He has a history of migraine headaches which have also increased in frequency during the past two weeks.  He denies pleuritic pain or shortness of breath. He has been prescribed an albuterol inhaler and Singulair in the past but did not like their effects.  He was on a recent flight from [place] and had difficulty equalizing pressure in his ears. He has hypertension that is normally controlled, but is increased today.  The history is provided by the patient and the spouse.    Past Medical History:  Diagnosis Date  . Genital warts   . GERD (gastroesophageal reflux disease)   . Hypertension     Patient Active Problem List   Diagnosis Date Noted  . Obese 09/23/2015  . Cervico-occipital neuralgia of left side 07/08/2015  . Seborrheic dermatitis 07/02/2015  . HTN (hypertension) 09/11/2014  . Snores 09/11/2014  . Situational anxiety 09/11/2014  . Sinusitis, chronic 09/11/2014  . GERD (gastroesophageal reflux disease) 09/11/2014    Past Surgical History:  Procedure Laterality Date  . TONSILLECTOMY    . WISDOM TOOTH EXTRACTION         Home Medications    Prior to Admission medications   Medication Sig Start Date End Date Taking? Authorizing Provider  Azelastine-Fluticasone (DYMISTA) 137-50 MCG/ACT SUSP Use one spray in each nostril twice daily. 04/28/17  Yes Darlyne Russian, MD    fluticasone (FLONASE) 50 MCG/ACT nasal spray Place 1 spray into both nostrils daily as needed for allergies or rhinitis.   Yes [provider]  hydrochlorothiazide (HYDRODIURIL) 12.5 MG tablet Take 1 tablet (12.5 mg total) by mouth daily. APPOINTMENT NEEDED FOR FURTHER REFILLS 12/25/17  Yes Gregor Hams, MD  loratadine (CLARITIN) 10 MG tablet Take 10 mg by mouth daily as needed.    Yes [provider]  metoprolol tartrate (LOPRESSOR) 25 MG tablet Take 1 tablet (25 mg total) by mouth 2 (two) times daily as needed (hypertensive episode). 01/26/17  Yes Gregor Hams, MD  Multiple Vitamin (MULTIVITAMIN) tablet Take 1 tablet by mouth daily.   Yes [provider]  ranitidine (ZANTAC) 300 MG tablet TAKE 1 TABLET(300 MG) BY MOUTH TWICE DAILY 09/11/16  Yes Gregor Hams, MD  albuterol (PROVENTIL HFA;VENTOLIN HFA) 108 (90 Base) MCG/ACT inhaler Inhale 2 puffs into the lungs every 6 (six) hours as needed for wheezing or shortness of breath. 08/10/16   Gregor Hams, MD  amoxicillin (AMOXIL) 875 MG tablet Take 1 tablet (875 mg total) by mouth 2 (two) times daily. 01/11/18   Kandra Nicolas, MD  Calcium Carbonate Antacid (TUMS PO) Take by mouth daily as needed.     [provider]  ondansetron (ZOFRAN-ODT) 4 MG disintegrating tablet Take 1 tablet (4 mg total) by mouth every 8 (eight) hours as needed for nausea  or vomiting. 04/29/17   Jacqulyn Cane, MD  ranitidine (ZANTAC) 300 MG tablet TAKE 1 TABLET(300 MG) BY MOUTH TWICE DAILY 05/08/17   Gregor Hams, MD  ranitidine (ZANTAC) 300 MG tablet TAKE 1 TABLET(300 MG) BY MOUTH TWICE DAILY 11/27/17   Gregor Hams, MD  rizatriptan (MAXALT-MLT) 5 MG disintegrating tablet TAKE 1 TABLET BY MOUTH EVERY 2 HOURS. 01/26/17   Gregor Hams, MD  Simethicone (GAS-X PO) Take by mouth as needed.    [provider]  triamcinolone cream (KENALOG) 0.5 % Apply 1 application topically 2 (two) times daily. To affected areas. 07/02/15   Silverio Decamp, MD    Family History Family History  Problem Relation Age of Onset  . Alcohol abuse Brother   . Depression Brother   . Diabetes Maternal Aunt   . High blood pressure Mother   . Migraines Father   . High blood pressure Father   . High blood pressure Maternal Grandmother   . High blood pressure Maternal Grandfather   . High blood pressure Paternal Grandmother   . Diabetes Paternal Grandfather   . High blood pressure Paternal Grandfather     Social History Social History   Tobacco Use  . Smoking status: Never Smoker  . Smokeless tobacco: Never Used  Substance Use Topics  . Alcohol use: No  . Drug use: No     Allergies   Patient has no known allergies.   Review of Systems Review of Systems ? sore throat + cough No pleuritic pain No wheezing + nasal congestion + post-nasal drainage + sinus pain/pressure No itchy/red eyes + earache No hemoptysis No SOB No fever/chills + nausea, intermittent No vomiting No abdominal pain No diarrhea No urinary symptoms No skin rash + fatigue No myalgias + headache Used OTC meds without relief   Physical Exam Triage Vital Signs ED Triage Vitals  Enc Vitals Group     BP      Pulse      Resp      Temp      Temp src      SpO2      Weight      Height      Head Circumference      Peak Flow      Pain Score      Pain Loc      Pain Edu?      Excl. in [place]?    No data found.  Updated Vital Signs BP (!) 139/109 (BP Location: Right Arm)   Pulse (!) 103   Temp (!) 97.5 F (36.4 C) (Oral)   Resp 16   Ht 6' 0.5" (1.842 m)   Wt 113.4 kg   SpO2 94%   BMI 33.44 kg/m   Visual Acuity Right Eye Distance:   Left Eye Distance:   Bilateral Distance:    Right Eye Near:   Left Eye Near:    Bilateral Near:     Physical Exam Nursing notes and Vital Signs reviewed. Appearance:  Patient appears stated age, and in no acute distress Eyes:  Pupils are equal, round, and reactive to light and accomodation.   Extraocular movement is intact.  Conjunctivae are not inflamed  Ears:  Canals normal.  Tympanic membranes normal.  Nose:  Congested turbinates.  Frontal sinus tenderness present.   Bilateral maxillary sinus tenderness is present.  Pharynx:  Normal Neck:  Supple.  Enlarged posterior/lateral nodes are palpated bilaterally, tender to palpation on the left.  Lungs:  Clear to auscultation.  Breath sounds are equal.  Moving air well. Heart:  Regular rate and rhythm without murmurs, rubs, or gallops.  Abdomen:  Nontender without masses or hepatosplenomegaly.  Bowel sounds are present.  No CVA or flank tenderness.  Extremities:  No edema.  Skin:  No rash present.    UC Treatments / Results  Labs (all labs ordered are listed, but only abnormal results are displayed) Labs Reviewed -   Tympanometry:  Right ear tympanogram positive peak pressure; Left ear tympanogram high peak height  EKG None  Radiology No results found.  Procedures Procedures (including critical care time)  Medications Ordered in UC Medications - No data to display  Initial Impression / Assessment and Plan / UC Course  I have reviewed the triage vital signs and the nursing notes.  Pertinent labs & imaging results that were available during my care of the patient were reviewed by me and considered in my medical decision making (see chart for details).    No evidence otitis media (eustachian tube congestion more likely). Offered prednisone RX but patient declines.  Begin amoxicillin for 10 days. Followup with Family Doctor if not improved in about 10 days.   Final Clinical Impressions(s) / UC Diagnoses   Final diagnoses:  Acute maxillary sinusitis, recurrence not specified  Viral URI with cough  Bilateral temporomandibular joint pain     Discharge Instructions     Take plain guaifenesin (1200mg  extended release tabs such as Mucinex) twice daily, with plenty of water, for cough and congestion.  Get adequate  rest.   May use Afrin nasal spray (or generic oxymetazoline) each morning for about 5 days and then discontinue.  Also recommend using saline nasal spray several times daily and saline nasal irrigation (AYR is a common brand).  Use Flonase nasal spray each morning after using Afrin nasal spray and saline nasal irrigation. May take Delsym Cough Suppressant at bedtime for nighttime cough.  Stop all antihistamines for now, and other non-prescription cough/cold preparations. May take Ibuprofen 200mg , 4 tabs every 8 hours with food for TMJ pain.  Apply ice pack to TMJ's for 15 to 20 minutes, 3 to 4 times daily  Continue until pain decreases.    Recommend a flu shot when well.     ED Prescriptions    Medication Sig Dispense Auth. Provider   amoxicillin (AMOXIL) 875 MG tablet Take 1 tablet (875 mg total) by mouth 2 (two) times daily. 20 tablet Kandra Nicolas, MD         Kandra Nicolas, MD 01/11/18 [phone]

## 2018-01-30 ENCOUNTER — Ambulatory Visit (INDEPENDENT_AMBULATORY_CARE_PROVIDER_SITE_OTHER): Payer: BC Managed Care – PPO | Admitting: Physician Assistant

## 2018-01-30 ENCOUNTER — Encounter: Payer: Self-pay | Admitting: Physician Assistant

## 2018-01-30 VITALS — BP 146/99 | HR 95 | Temp 98.2°F | Wt 255.0 lb

## 2018-01-30 DIAGNOSIS — J019 Acute sinusitis, unspecified: Secondary | ICD-10-CM

## 2018-01-30 DIAGNOSIS — I10 Essential (primary) hypertension: Secondary | ICD-10-CM

## 2018-01-30 MED ORDER — DOXYCYCLINE HYCLATE 100 MG PO TABS: 100.0000 mg | ORAL_TABLET | Freq: Two times a day (BID) | ORAL | 0 refills | Status: DC

## 2018-01-30 MED ORDER — METOPROLOL TARTRATE 25 MG PO TABS: 25.0000 mg | ORAL_TABLET | Freq: Two times a day (BID) | ORAL | 0 refills | Status: DC | PRN

## 2018-01-30 MED ORDER — HYDROCHLOROTHIAZIDE 12.5 MG PO TABS: 12.5000 mg | ORAL_TABLET | Freq: Every day | ORAL | 0 refills | Status: DC

## 2018-01-30 NOTE — Progress Notes (Signed)
Subjective:    Patient ID: Brendan Neal, male    DOB: April 30, 1965, 52 y.o.   MRN: [id number]  HPI Patient is a 52 year old male with a hx of HTN, chronic sinusitis, and GERD presenting today with a complaint of sinus pressure/pain. He was seen in Urgent Care for maxillary sinusitis on 11/22 and was given amoxicillin. He reports that symptoms did not fully resolve and he was recently on an airplane with multiple sick people. His symptoms have now worsened.  He is currently complaining of pain in maxillary and frontal sinuses, jaw and throat. It is painful for him to talk, chew and make facial expressions. He has fullness in his ears and has felt feverish but home thermometer was negative for fever. Has had dark yellow and blood tinged nasal discharge. Admits to some dizziness and balances changes. Denies sore throat, cough, SOB, and chest pain.   He is leaving for a 3 week trip to [place] on Sunday. BP was elevated today. Patient reports home BP staying around 130-80s but has been under a significant amount of stress and working 60-80 hour weeks lately. He is followed by Dr. Georgina Snell for HTN and reports compliance with his diuretic. He has metoprolol for prn elevated BP and uses accordingly.   Review of Systems  HENT: Positive for congestion, hearing loss, nosebleeds, postnasal drip, rhinorrhea, sinus pressure and sinus pain. Negative for sneezing, sore throat and trouble swallowing.   Respiratory: Negative for cough, chest tightness, shortness of breath and wheezing.   Cardiovascular: Negative for chest pain and palpitations.  Neurological: Positive for dizziness. Negative for light-headedness and headaches.       Objective:   Physical Exam  Constitutional: He is oriented to person, place, and time. He appears well-developed and well-nourished. No distress.  HENT:  Right Ear: Ear canal normal. A middle ear effusion is present.  Left Ear: Ear canal normal. A middle ear effusion is present.   Nose: Right sinus exhibits maxillary sinus tenderness and frontal sinus tenderness. Left sinus exhibits maxillary sinus tenderness and frontal sinus tenderness.  Mouth/Throat: Oropharynx is clear and moist.  Cardiovascular: Normal rate, regular rhythm and normal heart sounds. Exam reveals no gallop and no friction rub.  No murmur heard. Pulmonary/Chest: Effort normal and breath sounds normal. No respiratory distress. He has no wheezes.  Lymphadenopathy:    He has cervical adenopathy.  Neurological: He is alert and oriented to person, place, and time.  Psychiatric: He has a normal mood and affect. His behavior is normal. Judgment and thought content normal.    Vitals:   01/30/18 0905 01/30/18 0940  BP: (!) 152/115 (!) 146/99  Pulse: 95   Temp: 98.2 F (36.8 C)   SpO2: 96%     Lab Results  Component Value Date   CREATININE 1.09 01/09/2017   BUN 16 01/09/2017   NA 138 01/09/2017   K 3.7 01/09/2017   CL 104 01/09/2017   CO2 24 01/09/2017          Assessment & Plan:  Marland KitchenMarland KitchenLynette was seen today for sinusitis.  Diagnoses and all orders for this visit:  Acute sinusitis treated with antibiotics in the past 60 days -     doxycycline (VIBRA-TABS) 100 MG tablet; Take 1 tablet (100 mg total) by mouth 2 (two) times daily.  Elevated blood pressure reading in office with diagnosis of hypertension  Hypertension goal BP (blood pressure) < 130/80 -     metoprolol tartrate (LOPRESSOR) 25 MG tablet; Take 1  tablet (25 mg total) by mouth 2 (two) times daily as needed (hypertensive episode). -     hydrochlorothiazide (HYDRODIURIL) 12.5 MG tablet; Take 1 tablet (12.5 mg total) by mouth daily. APPOINTMENT NEEDED FOR FURTHER REFILLS  Sinusitis: Persistent symptoms > 4 weeks, Failed Amoxicillin. Starting doxycycline. Counseled on supportive care. Discontinue daily flonase use due to nosebleeds and start saline spray or gel to moisturize. May use flonase occasionally if nosebleeds are not present.    HTN: Continue HCTZ. Start metoprolol 25 mg bid. Since patient is going to [place] for 3 weeks he will follow up w/ Dr. Georgina Snell after he returns to reevaluate BP and adjust medications as necessary. Patient is aware and agreeable.

## 2018-01-30 NOTE — Patient Instructions (Addendum)
For your blood pressure: - Goal <130/80 (Ideally 120's/70's) - re-start Metoprolol 25 mg twice a day - continue Hydrochlorothiazide every morning - also recommend baby aspirin 81 mg daily to help prevent heart attack/stroke - monitor and log blood pressures at home - check around the same time each day in a relaxed setting - Limit salt to <2500 mg/day - Follow DASH (Dietary Approach to Stopping Hypertension) eating plan - Try to get at least 150 minutes of aerobic exercise per week - Aim to go on a brisk walk 30 minutes per day at least 5 days per week. If you're not active, gradually increase how long you walk by 5 minutes each week - limit alcohol: 2 standard drinks per day for men and 1 per day for women - avoid tobacco/nicotine products. Consider smoking cessation if you smoke - weight loss: 7% of current body weight can reduce your blood pressure by 5-10 points - follow-up at least every 6 months for your blood pressure. Follow-up sooner if your BP is not controlled  For nasal symptoms/sinusitis: - nasal saline rinses / netti pot several times per day (do this prior to nasal spray) - reduce Flonase to every other day while you are having nosebleeds - moisturize nasal passages with A[place] nasal saline gel and/or Aquafor - warm facial compresses  - for sinus headache/pain: Tylenol 1000mg  every 8 hours as needed. Alternate with Ibuprofen 600mg  every 6 hours as needed  Note: follow package instructions for all over-the-counter medications. If using multi-symptom medications (Dayquil, Theraflu, etc.), check the label for duplicate drug ingredients.

## 2018-02-26 ENCOUNTER — Telehealth: Payer: Self-pay

## 2018-02-26 NOTE — Telephone Encounter (Signed)
Pt advised. He will call tomorrow for an appt with PCP, he currently is going through immigration in the [employer].

## 2018-02-26 NOTE — Telephone Encounter (Signed)
OK to take the metoprolol.  Recheck soonish.  Go to ED if worsening

## 2018-02-26 NOTE — Telephone Encounter (Signed)
Brendan Neal called and states he his blood pressure medication is causing him to have redness in the face dizziness and sweats. Denies chest pain, shortness of breath or shoulder/arm pain. He is traveling and this happened for about a hour on the plane today. He has to go back on the plane this evening and wanted to know if he is ok to fly and should he take the evening Metoprolol. Please advise.

## 2018-02-27 ENCOUNTER — Ambulatory Visit (INDEPENDENT_AMBULATORY_CARE_PROVIDER_SITE_OTHER): Payer: BC Managed Care – PPO | Admitting: Family Medicine

## 2018-02-27 ENCOUNTER — Encounter: Payer: Self-pay | Admitting: Family Medicine

## 2018-02-27 VITALS — BP 136/94 | HR 67 | Ht 73.5 in | Wt 249.0 lb

## 2018-02-27 DIAGNOSIS — R002 Palpitations: Secondary | ICD-10-CM

## 2018-02-27 DIAGNOSIS — F419 Anxiety disorder, unspecified: Secondary | ICD-10-CM

## 2018-02-27 DIAGNOSIS — R232 Flushing: Secondary | ICD-10-CM | POA: Diagnosis not present

## 2018-02-27 DIAGNOSIS — I10 Essential (primary) hypertension: Secondary | ICD-10-CM | POA: Diagnosis not present

## 2018-02-27 MED ORDER — MONTELUKAST SODIUM 10 MG PO TABS: 10.0000 mg | ORAL_TABLET | Freq: Every day | ORAL | 3 refills | Status: DC

## 2018-02-27 NOTE — Patient Instructions (Addendum)
Thank you for coming in today. Get labs You should hear from cardiology soon.  Let me know if you do not hear form cardiology.   We should also address anxiety in the fuiture. Once heart issue is evaluated fully.   Try not taking the metoprolol or the HCTZ and see if your symotoms improve.   Lets add Montelukast (singulair).  This also works on the allergy system.   Montelukast oral tablets What is this medicine? MONTELUKAST (mon te LOO kast) is used to prevent and treat the symptoms of asthma. It is also used to treat allergies. Do not use for an acute asthma attack. This medicine may be used for other purposes; ask your health care provider or pharmacist if you have questions. COMMON BRAND NAME(S): Singulair What should I tell my health care provider before I take this medicine? They need to know if you have any of these conditions: -liver disease -an unusual or allergic reaction to montelukast, other medicines, foods, dyes, or preservatives -pregnant or trying to get pregnant -breast-feeding How should I use this medicine? This medicine should be given by mouth. Follow the directions on the prescription label. Take this medicine at the same time every day. You may take this medicine with or without meals. Do not chew the tablets. Do not stop taking your medicine unless your doctor tells you to. Talk to your pediatrician regarding the use of this medicine in children. Special care may be needed. While this drug may be prescribed for children as young as 84 years of age for selected conditions, precautions do apply. Overdosage: If you think you have taken too much of this medicine contact a poison control center or emergency room at once. NOTE: This medicine is only for you. Do not share this medicine with others. What if I miss a dose? If you miss a dose, take it as soon as you can. If it is almost time for your next dose, take only that dose. Do not take double or extra doses. What  may interact with this medicine? -anti-infectives like rifampin and rifabutin -medicines for seizures like phenytoin, phenobarbital, and carbamazepine This list may not describe all possible interactions. Give your health care provider a list of all the medicines, herbs, non-prescription drugs, or dietary supplements you use. Also tell them if you smoke, drink alcohol, or use illegal drugs. Some items may interact with your medicine. What should I watch for while using this medicine? Visit your doctor or health care professional for regular checks on your progress. Tell your doctor or health care professional if your allergy or asthma symptoms do not improve. Take your medicine even when you do not have symptoms. Do not stop taking any of your medicine(s) unless your doctor tells you to. If you have asthma, talk to your doctor about what to do in an acute asthma attack. Always have your inhaled rescue medicine for asthma attacks with you. Patients and their families should watch for new or worsening thoughts of suicide or depression. Also watch for sudden changes in feelings such as feeling anxious, agitated, panicky, irritable, hostile, aggressive, impulsive, severely restless, overly excited and hyperactive, or not being able to sleep. Any worsening of mood or thoughts of suicide or dying should be reported to your health care professional right away. What side effects may I notice from receiving this medicine? Side effects that you should report to your doctor or health care professional as soon as possible: -allergic reactions like skin rash or hives,  or swelling of the face, lips, or tongue -breathing problems -changes in emotions or moods -confusion -depressed mood -fever or infection -hallucinations -joint pain -painful lumps under the skin -pain, tingling, numbness in the hands or feet -redness, blistering, peeling, or loosening of the skin, including inside the  mouth -restlessness -seizures -sleep walking -signs and symptoms of infection like fever; chills; cough; sore throat; flu-like illness -signs and symptoms of liver injury like dark yellow or brown urine; general ill feeling or flu-like symptoms; light-colored stools; loss of appetite; nausea; right upper belly pain; unusually weak or tired; yellowing of the eyes or skin -sinus pain or swelling -stuttering -suicidal thoughts or other mood changes -tremors -trouble sleeping -uncontrolled muscle movements -unusual bleeding or bruising -vivid or bad dreams Side effects that usually do not require medical attention (report to your doctor or health care professional if they continue or are bothersome): -dizziness -drowsiness -headache -runny nose -stomach upset -tiredness This list may not describe all possible side effects. Call your doctor for medical advice about side effects. You may report side effects to FDA at 1-800-FDA-1088. Where should I keep my medicine? Keep out of the reach of children. Store at room temperature between 15 and 30 degrees C (59 and 86 degrees F). Protect from light and moisture. Keep this medicine in the original bottle. Throw away any unused medicine after the expiration date. NOTE: This sheet is a summary. It may not cover all possible information. If you have questions about this medicine, talk to your doctor, pharmacist, or health care provider.  2019 Elsevier/Gold Standard (2017-10-02 13:51:04)

## 2018-02-27 NOTE — Progress Notes (Signed)
Brendan Neal is a 53 y.o. male who presents to [facility] today for flushed face and palpitations.  [name] was in his normal state of health on an airplane yesterday when he developed feeling flushed and hot associated with palpitations.  He denies chest pain shortness of breath or sweating.  He was evaluated with a CNA on the airplane who checked his blood pressure and thought it was 120s over 70s.  She notes that his heart rate was higher initially but decreased down to 95 by the end of the episode.  He is feeling pretty well now but notes that symptoms similar to this has happened in the past.  He is not sure if his medications are to blame.  He takes hydrochlorothiazide daily and metoprolol twice daily.  He does note continued some lightheadedness and dizziness with these medications.  He has had problems with lightheadedness and dizziness with lisinopril in the past and was switched to these medications now.  He has not had an extensive heart work-up.  He does note increased job stresses recently.  He notes that he is not exercising regularly which has helped his anxiety in the past.  Additionally he notes ongoing nasal congestion and fullness.  This is been present for years.  He takes Claritin twice daily and Flonase nasal spray.  He has been seen by an allergist and notes allergies to multiple indoor and outdoor causes.  ROS as above:  Exam:  BP (!) 136/94   Pulse 67   Ht 6' 1.5" (1.867 m)   Wt 249 lb (112.9 kg)   BMI 32.41 kg/m  Wt Readings from Last 5 Encounters:  02/27/18 249 lb (112.9 kg)  01/30/18 255 lb (115.7 kg)  01/11/18 250 lb (113.4 kg)  05/03/17 245 lb (111.1 kg)  04/29/17 245 lb (111.1 kg)    Gen: Well NAD HEENT: EOMI,  MMM normal tympanic membranes bilaterally.  No significant nasal discharge.  Inflamed nasal turbinates bilaterally. Lungs: Normal  work of breathing. CTABL Heart: RRR no MRG Abd: NABS, Soft. Nondistended, Nontender Exts: Brisk capillary refill, warm and well perfused.   Lab and Radiology Results Twelve-lead EKG shows normal sinus rhythm at 61 bpm.  No ST segment elevation or depression.  Normal EKG.   Assessment and Plan: 53 y.o. male with  Tachypalpitations associated with flushing and lightheadedness.  EKG today thankfully is relatively normal.  However I do think further work-up is warranted as he is 53 years old and does have slight increased risk.  Plan for laboratory work-up listed below.  Additionally refer to cardiology.  He will will likely benefit from stress test.  Recheck with me in 1 month or less.  Additionally patient has a existing history of anxiety and his current symptoms certainly sounded like a panic attack.  I do think while working of his heart risk is also reasonable to pay attention to his anxiety.  Plan to refer to behavioral health for counseling which I think will benefit from significantly.  Plan to follow-up in the near future to further address and focus on anxiety.  Sinus congestion likely due to allergies.  Patient has maximized over-the-counter medications.  Plan to add Singulair and recheck in a month or so.  PDMP not reviewed this encounter. Orders Placed This Encounter  Procedures  . CBC  . COMPLETE METABOLIC PANEL WITH GFR  . TSH  . Lipid Panel w/reflex Direct LDL  . Ambulatory  referral to Cardiology    Referral Priority:   Routine    Referral Type:   Consultation    Referral Reason:   Specialty Services Required    Requested Specialty:   Cardiology    Number of Visits Requested:   1  . Ambulatory referral to Behavioral Health    Referral Priority:   Routine    Referral Type:   Psychiatric    Referral Reason:   Specialty Services Required    Requested Specialty:   Behavioral Health    Number of Visits Requested:   1   Meds ordered this encounter  Medications  .  montelukast (SINGULAIR) 10 MG tablet    Sig: Take 1 tablet (10 mg total) by mouth at bedtime.    Dispense:  90 tablet    Refill:  3     Historical information moved to improve visibility of documentation.  Past Medical History:  Diagnosis Date  . Genital warts   . GERD (gastroesophageal reflux disease)   . Hypertension    Past Surgical History:  Procedure Laterality Date  . TONSILLECTOMY    . WISDOM TOOTH EXTRACTION     Social History   Tobacco Use  . Smoking status: Never Smoker  . Smokeless tobacco: Never Used  Substance Use Topics  . Alcohol use: No   family history includes Alcohol abuse in his brother; Depression in his brother; Diabetes in his maternal aunt and paternal grandfather; High blood pressure in his father, maternal grandfather, maternal grandmother, mother, paternal grandfather, and paternal grandmother; Migraines in his father.  Medications: Current Outpatient Medications  Medication Sig Dispense Refill  . Calcium Carbonate Antacid (TUMS PO) Take by mouth daily as needed.     . fluticasone (FLONASE) 50 MCG/ACT nasal spray Place 1 spray into both nostrils daily as needed for allergies or rhinitis.    . hydrochlorothiazide (HYDRODIURIL) 12.5 MG tablet Take 1 tablet (12.5 mg total) by mouth daily. APPOINTMENT NEEDED FOR FURTHER REFILLS 30 tablet 0  . loratadine (CLARITIN) 10 MG tablet Take 10 mg by mouth daily as needed.     . metoprolol tartrate (LOPRESSOR) 25 MG tablet Take 1 tablet (25 mg total) by mouth 2 (two) times daily as needed (hypertensive episode). 60 tablet 0  . Multiple Vitamin (MULTIVITAMIN) tablet Take 1 tablet by mouth daily.    . ranitidine (ZANTAC) 300 MG tablet TAKE 1 TABLET(300 MG) BY MOUTH TWICE DAILY 180 tablet 1  . rizatriptan (MAXALT-MLT) 5 MG disintegrating tablet TAKE 1 TABLET BY MOUTH EVERY 2 HOURS. 10 tablet 6  . Simethicone (GAS-X PO) Take by mouth as needed.    . triamcinolone cream (KENALOG) 0.5 % Apply 1 application topically 2  (two) times daily. To affected areas. 30 g 3  . montelukast (SINGULAIR) 10 MG tablet Take 1 tablet (10 mg total) by mouth at bedtime. 90 tablet 3   No current facility-administered medications for this visit.    No Known Allergies   Discussed warning signs or symptoms. Please see discharge instructions. Patient expresses understanding.

## 2018-02-28 NOTE — Addendum Note (Signed)
Addended by: Alena Bills R on: 02/28/2018 10:50 AM   Modules accepted: Orders

## 2018-03-01 ENCOUNTER — Encounter: Payer: Self-pay | Admitting: Cardiology

## 2018-03-01 ENCOUNTER — Ambulatory Visit: Payer: BC Managed Care – PPO | Admitting: Cardiology

## 2018-03-01 VITALS — BP 122/84 | HR 79 | Ht 73.5 in | Wt 247.0 lb

## 2018-03-01 DIAGNOSIS — I10 Essential (primary) hypertension: Secondary | ICD-10-CM

## 2018-03-01 DIAGNOSIS — R232 Flushing: Secondary | ICD-10-CM

## 2018-03-01 DIAGNOSIS — R0789 Other chest pain: Secondary | ICD-10-CM | POA: Insufficient documentation

## 2018-03-01 HISTORY — DX: Other chest pain: R07.89

## 2018-03-01 HISTORY — DX: Flushing: R23.2

## 2018-03-01 NOTE — Progress Notes (Signed)
As patient was leaving he informed me that he is currently taking hydrochlorothiazide 12.5 mg daily for 3 days and then switching to metoprolol 25 mg twice a day for 3 days and alternating this regimen per his pcp to determine which medication best works for him. Dr. Geraldo Pitter aware

## 2018-03-01 NOTE — Addendum Note (Signed)
Addended by: Ashok Norris on: 03/01/2018 08:56 AM   Modules accepted: Orders

## 2018-03-01 NOTE — Progress Notes (Signed)
Cardiology Office Note:    Date:  03/01/2018   ID:  Brendan Neal, DOB 1965/05/01, MRN [id number]  PCP:  Gregor Hams, MD  Cardiologist:  Jenean Lindau, MD   Referring MD: Gregor Hams, MD    ASSESSMENT:    1. Chest discomfort   2. Essential hypertension   3. Facial flushing    PLAN:    In order of problems listed above:  1. I reassured the patient about my findings.  I am not clear about the etiology.  He is flushed feeling concerns me and I will evaluate him for pheochromocytoma with urine 24-hour testing. 2. His other blood test is pending including TSH and we will await those results. 3. For his chest discomfort we will do a exercise stress echo. 4. Patient will be seen in follow-up appointment in 6 months or earlier if the patient has any concerns.  If his stress test is negative advised him to start an exercise program.  He vocalized understanding of the above and questions were answered to his satisfaction   Medication Adjustments/Labs and Tests Ordered: Current medicines are reviewed at length with the patient today.  Concerns regarding medicines are outlined above.  No orders of the defined types were placed in this encounter.  No orders of the defined types were placed in this encounter.    History of Present Illness:    Brendan Neal is a 53 y.o. male who is being seen today for the evaluation of facial flushing and chest discomfort at the request of Gregor Hams, MD.  Patient is a pleasant 53 year old male.  He has past medical history of essential hypertension.  He mentions to me that occasionally he will have a flushing sensation in his face.  He was flying back from [place] and this happened in the plane.  He had to get ice packs and was seated near the air conditioner.  Subsequently he felt better and is here for this evaluation.  He occasionally has chest discomfort not related to exertion.  No radiation of the symptoms to any part of the body.  At the time of  my evaluation, the patient is alert awake oriented and in no distress.  Past Medical History:  Diagnosis Date  . Genital warts   . GERD (gastroesophageal reflux disease)   . Hypertension     Past Surgical History:  Procedure Laterality Date  . TONSILLECTOMY    . WISDOM TOOTH EXTRACTION      Current Medications: Current Meds  Medication Sig  . Calcium Carbonate Antacid (TUMS PO) Take by mouth daily as needed.   . fluticasone (FLONASE) 50 MCG/ACT nasal spray Place 1 spray into both nostrils daily as needed for allergies or rhinitis.  Marland Kitchen loratadine (CLARITIN) 10 MG tablet Take 10 mg by mouth daily as needed.   . metoprolol tartrate (LOPRESSOR) 25 MG tablet Take 1 tablet (25 mg total) by mouth 2 (two) times daily as needed (hypertensive episode).  . montelukast (SINGULAIR) 10 MG tablet Take 1 tablet (10 mg total) by mouth at bedtime.  . Multiple Vitamin (MULTIVITAMIN) tablet Take 1 tablet by mouth daily.  . ranitidine (ZANTAC) 300 MG tablet TAKE 1 TABLET(300 MG) BY MOUTH TWICE DAILY  . rizatriptan (MAXALT-MLT) 5 MG disintegrating tablet TAKE 1 TABLET BY MOUTH EVERY 2 HOURS.  . Simethicone (GAS-X PO) Take by mouth as needed.  . triamcinolone cream (KENALOG) 0.5 % Apply 1 application topically 2 (two) times daily. To affected areas.  Allergies:   Patient has no known allergies.   Social History   Socioeconomic History  . Marital status: Married    Spouse name: Not on file  . Number of children: Not on file  . Years of education: Not on file  . Highest education level: Not on file  Occupational History  . Not on file  Social Needs  . Financial resource strain: Not on file  . Food insecurity:    Worry: Not on file    Inability: Not on file  . Transportation needs:    Medical: Not on file    Non-medical: Not on file  Tobacco Use  . Smoking status: Never Smoker  . Smokeless tobacco: Never Used  Substance and Sexual Activity  . Alcohol use: No  . Drug use: No  . Sexual  activity: Yes  Lifestyle  . Physical activity:    Days per week: Not on file    Minutes per session: Not on file  . Stress: Not on file  Relationships  . Social connections:    Talks on phone: Not on file    Gets together: Not on file    Attends religious service: Not on file    Active member of club or organization: Not on file    Attends meetings of clubs or organizations: Not on file    Relationship status: Not on file  Other Topics Concern  . Not on file  Social History Narrative  . Not on file     Family History: The patient's family history includes Alcohol abuse in his brother; Depression in his brother; Diabetes in his maternal aunt and paternal grandfather; High blood pressure in his father, maternal grandfather, maternal grandmother, mother, paternal grandfather, and paternal grandmother; Migraines in his father.  ROS:   Please see the history of present illness.    All other systems reviewed and are negative.  EKGs/Labs/Other Studies Reviewed:    The following studies were reviewed today: EKG reveals sinus rhythm and nonspecific ST-T changes.   Recent Labs: No results found for requested labs within last 8760 hours.  Recent Lipid Panel    Component Value Date/Time   LDLDIRECT 98 09/11/2014 0943    Physical Exam:    VS:  BP 122/84 (BP Location: Right Arm, Patient Position: Sitting, Cuff Size: Normal)   Pulse 79   Ht 6' 1.5" (1.867 m)   Wt 247 lb (112 kg)   SpO2 97%   BMI 32.15 kg/m     Wt Readings from Last 3 Encounters:  03/01/18 247 lb (112 kg)  02/27/18 249 lb (112.9 kg)  01/30/18 255 lb (115.7 kg)     GEN: Patient is in no acute distress HEENT: Normal NECK: No JVD; No carotid bruits LYMPHATICS: No lymphadenopathy CARDIAC: S1 S2 regular, 2/6 systolic murmur at the apex. RESPIRATORY:  Clear to auscultation without rales, wheezing or rhonchi  ABDOMEN: Soft, non-tender, non-distended MUSCULOSKELETAL:  No edema; No deformity  SKIN: Warm and  dry NEUROLOGIC:  Alert and oriented x 3 PSYCHIATRIC:  Normal affect    Signed, Jenean Lindau, MD  03/01/2018 8:48 AM    [facility]

## 2018-03-01 NOTE — Patient Instructions (Signed)
Medication Instructions:  Your physician recommends that you continue on your current medications as directed. Please refer to the Current Medication list given to you today.  If you need a refill on your cardiac medications before your next appointment, please call your pharmacy.   Lab work: Your physician recommends that you return for lab work today: 24 hour urine  If you have labs (blood work) drawn today and your tests are completely normal, you will receive your results only by: Marland Kitchen MyChart Message (if you have MyChart) OR . A paper copy in the mail If you have any lab test that is abnormal or we need to change your treatment, we will call you to review the results.  Testing/Procedures: Your physician has requested that you have a stress echocardiogram. For further information please visit [email or web address]. Please follow instruction sheet as given.    Follow-Up: At [facility], you and your health needs are our priority.  As part of our continuing mission to provide you with exceptional heart care, we have created designated Provider Care Teams.  These Care Teams include your primary Cardiologist (physician) and Advanced Practice Providers (APPs -  Physician Assistants and Nurse Practitioners) who all work together to provide you with the care you need, when you need it. You will need a follow up appointment in 6 months.  Please call our office 2 months in advance to schedule this appointment.  You may see No primary care provider on file. or another member of our [employer] in [place]: Jenne Campus, MD . Shirlee More, MD  Any Other Special Instructions Will Be Listed Below (If Applicable).   Exercise Stress Echocardiogram  An exercise stress echocardiogram is a test to check how well your heart is working. This test uses sound waves (ultrasound) and a computer to make images of your heart before and after exercise. Ultrasound images that are taken  before you exercise (your resting echocardiogram) will show how much blood is getting to your heart muscle and how well your heart muscle and heart valves are functioning. During the next part of this test, you will walk on a treadmill or ride a stationary bike to see how exercise affects your heart. While you exercise, the electrical activity of your heart will be monitored with an electrocardiogram (ECG). Your blood pressure will also be monitored. You may have this test if you:  Have chest pain or other symptoms of a heart problem.  Recently had a heart attack or heart surgery.  Have heart valve problems.  Have a condition that causes narrowing of the blood vessels that supply your heart (coronary artery disease).  Have a high risk of heart disease and are starting a new exercise program.  Have a high risk of heart disease and need to have major surgery. Tell a health care provider about:  Any allergies you have.  All medicines you are taking, including vitamins, herbs, eye drops, creams, and over-the-counter medicines.  Any problems you or family members have had with anesthetic medicines.  Any blood disorders you have.  Any surgeries you have had.  Any medical conditions you have.  Whether you are pregnant or may be pregnant. What are the risks? Generally, this is a safe procedure. However, problems may occur, including:  Chest pain.  Dizziness or light-headedness.  Shortness of breath.  Increased or irregular heartbeat (palpitations).  Nausea or vomiting.  Heart attack (very rare). What happens before the procedure?  Follow instructions from your health  care provider about eating or drinking restrictions. You may be asked to avoid all forms of caffeine for 24 hours before your procedure, or as told by your health care provider.  Ask your health care provider about changing or stopping your regular medicines. This is especially important if you are taking diabetes  medicines or blood thinners.  If you use an inhaler, bring it with you to the test.  Wear loose, comfortable clothing and walking shoes.  Do notuse any products that contain nicotine or tobacco, such as cigarettes and e-cigarettes, for 4 hours before the test or as told by your health care provider. If you need help quitting, ask your health care provider. What happens during the procedure?  You will take off your clothes from the waist up and put on a hospital gown.  A technician will place electrodes on your chest.  A blood pressure cuff will be placed on your arm.  You will lie down on a table for an ultrasound exam before you exercise. Gel will be rubbed on your chest, and a handheld device (transducer) will be pressed against your chest and moved over your heart.  Then, you will start exercising by walking on a treadmill or pedaling a stationary bicycle.  Your blood pressure and heart rhythm will be monitored while you exercise.  The exercise will gradually get harder or faster.  You will exercise until: ? Your heart reaches a target level. ? You are too tired to continue. ? You cannot continue because of chest pain, weakness, or dizziness.  You will have another ultrasound exam after you stop exercising. The procedure may vary among health care providers and hospitals. What happens after the procedure?  Your heart rate and blood pressure will be monitored until they return to your normal levels. Summary  An exercise stress echocardiogram is a test that uses ultrasound to check how well your heart works before and after exercise.  Before the test, follow instructions from your health care provider about stopping medications, avoiding nicotine and tobacco, and avoiding certain foods and drinks.  During the test, your blood pressure and heart rhythm will be monitored while you exercise on a treadmill or stationary bicycle. This information is not intended to replace advice  given to you by your health care provider. Make sure you discuss any questions you have with your health care provider. Document Released: 02/11/2004 Document Revised: 09/29/2015 Document Reviewed: 09/29/2015 Elsevier Interactive Patient Education  2019 [employer].

## 2018-03-04 ENCOUNTER — Ambulatory Visit (HOSPITAL_BASED_OUTPATIENT_CLINIC_OR_DEPARTMENT_OTHER)
Admission: RE | Admit: 2018-03-04 | Discharge: 2018-03-04 | Disposition: A | Discharge status: Home / Self Care | Payer: BC Managed Care – PPO | Source: Ambulatory Visit | Attending: Cardiology | Admitting: Cardiology

## 2018-03-04 DIAGNOSIS — R0789 Other chest pain: Secondary | ICD-10-CM

## 2018-03-04 NOTE — Progress Notes (Signed)
  Echocardiogram Echocardiogram Stress Test has been performed.  Brendan Neal T Kashis Penley 03/04/2018, 9:48 AM

## 2018-03-05 ENCOUNTER — Telehealth: Payer: Self-pay

## 2018-03-05 NOTE — Telephone Encounter (Signed)
Attempted to inform pt of normal stress echo per RRR. Unable to LVM.

## 2018-03-05 NOTE — Telephone Encounter (Signed)
-----   Message from Jenean Lindau, MD sent at 03/04/2018  1:44 PM EST ----- The results of the study is unremarkable. Please inform patient. I will discuss in detail at next appointment. Cc  primary care/referring physician Jenean Lindau, MD 03/04/2018 1:44 PM

## 2018-03-06 ENCOUNTER — Other Ambulatory Visit: Payer: Self-pay | Admitting: Physician Assistant

## 2018-03-06 ENCOUNTER — Other Ambulatory Visit: Payer: Self-pay | Admitting: Family Medicine

## 2018-03-06 ENCOUNTER — Ambulatory Visit (INDEPENDENT_AMBULATORY_CARE_PROVIDER_SITE_OTHER): Payer: BC Managed Care – PPO | Admitting: Family Medicine

## 2018-03-06 ENCOUNTER — Encounter: Payer: Self-pay | Admitting: Family Medicine

## 2018-03-06 VITALS — BP 111/79 | HR 80 | Ht 73.5 in | Wt 247.0 lb

## 2018-03-06 DIAGNOSIS — K219 Gastro-esophageal reflux disease without esophagitis: Secondary | ICD-10-CM

## 2018-03-06 DIAGNOSIS — F411 Generalized anxiety disorder: Secondary | ICD-10-CM | POA: Diagnosis not present

## 2018-03-06 DIAGNOSIS — I10 Essential (primary) hypertension: Secondary | ICD-10-CM

## 2018-03-06 NOTE — Patient Instructions (Signed)
Thank you for coming in today.  Consider  [facility] [id number], [place] [address] [phone]  STOP HCTZ Continue metoprolol.   What medicine is your bother on?  Get labs.   Recheck in 1 month.

## 2018-03-06 NOTE — Progress Notes (Signed)
Brendan Neal is a 53 y.o. male who presents to [place]: [place] today for follow-up hypertension anxiety and heart work-up.  Brendan Neal was seen on January 8 following an episode of flushing and lightheadedness while traveling on an airplane.  He had the initial work-up completed in my clinic and was referred to cardiology.  Fortunately he was seen by cardiology on January 10 and had a fortunately normal stress echocardiogram on January 13.   In the interim he has tried being on hydrochlorothiazide and off of hydrochlorothiazide and on metoprolol and off of metoprolol.  He notes that he feels better with less lightheadedness and dizziness and has better blood pressure control at home off of hydrochlorothiazide and on metoprolol.  He would like to discontinue metoprolol.  Additionally he has been advised to increase his exercise.  He has a plan to start exercising.   Anxiety: As noted previously Brendan Neal has lots of stress at work with worsening anxiety.  He was considering counseling at the last visit.  He was referred to behavioral health for counseling on January 8.  He is in the process of scheduling.  He notes continued anxiety symptoms.  He notes his brother also has anxiety and is taking anxiety medicine but is not sure what the name of it is.   Additionally on the eighth he was complaining of nasal congestion despite maximizing his antihistamines and nasal steroids.  I prescribed Singulair.  In the interim he notes that has not started it yet and continues to experience sinus congestion.  ROS as above:  Exam:  BP 111/79   Pulse 80   Ht 6' 1.5" (1.867 m)   Wt 247 lb (112 kg)   BMI 32.15 kg/m  Wt Readings from Last 5 Encounters:  03/06/18 247 lb (112 kg)  03/01/18 247 lb (112 kg)  02/27/18 249 lb (112.9 kg)  01/30/18 255 lb (115.7 kg)  01/11/18 250 lb (113.4 kg)    Gen: Well  NAD HEENT: EOMI,  MMM Lungs: Normal work of breathing. CTABL Heart: RRR no MRG Abd: NABS, Soft. Nondistended, Nontender Exts: Brisk capillary refill, warm and well perfused.  Psych alert and oriented normal speech thought process and affect.  Lab and Radiology Results Stress ECHO 03/04/18 Stress Echocardiography  Patient:    Brendan, Neal MR #:       [id number] Study Date: 03/04/2018 Gender:     M Age:        62 Height:     186.7 cm Weight:     112 kg BSA:        2.44 m^2 Pt. Status: Room:   ATTENDING    Jyl Heinz, MD  ORDERING     Jyl Heinz, MD  [place], MD  [place], [place]  SONOGRAPHER  Cardell Peach, RDCS  cc:  -------------------------------------------------------------------  ------------------------------------------------------------------- Study Conclusions  - Mitral valve: Valve area by pressure half-time: 2.18 cm^2. - Stress ECG conclusions: There were no stress arrhythmias or   conduction abnormalities. The stress ECG was normal. - Staged echo: There was no echocardiographic evidence for   stress-induced ischemia.     Assessment and Plan: 53 y.o. male with  Hypertension: Doing reasonably well.  Discontinue hydrochlorothiazide and continue metoprolol.  Complete previously arranged work-up including blood tests and cardiology ordered urine test for evaluation for unlikely but possible pheochromocytoma.  Anxiety: Recommend counseling.  Consider medication.  Lengthy  discussion.  Recheck in about a month or so.  Sinus congestion: Recommend starting Singulair.  I spent 25 minutes with this patient, greater than 50% was face-to-face time counseling regarding differential diagnosis treatment plan and options..    Historical information moved to improve visibility of documentation.  Past Medical History:  Diagnosis Date  . Genital warts   . GERD (gastroesophageal reflux disease)   . Hypertension      Past Surgical History:  Procedure Laterality Date  . TONSILLECTOMY    . WISDOM TOOTH EXTRACTION     Social History   Tobacco Use  . Smoking status: Never Smoker  . Smokeless tobacco: Never Used  Substance Use Topics  . Alcohol use: No   family history includes Alcohol abuse in his brother; Depression in his brother; Diabetes in his maternal aunt and paternal grandfather; High blood pressure in his father, maternal grandfather, maternal grandmother, mother, paternal grandfather, and paternal grandmother; Migraines in his father.  Medications: Current Outpatient Medications  Medication Sig Dispense Refill  . Calcium Carbonate Antacid (TUMS PO) Take by mouth daily as needed.     . fluticasone (FLONASE) 50 MCG/ACT nasal spray Place 1 spray into both nostrils daily as needed for allergies or rhinitis.    Marland Kitchen loratadine (CLARITIN) 10 MG tablet Take 10 mg by mouth daily as needed.     . metoprolol tartrate (LOPRESSOR) 25 MG tablet Take 1 tablet (25 mg total) by mouth 2 (two) times daily as needed (hypertensive episode). 60 tablet 0  . montelukast (SINGULAIR) 10 MG tablet Take 1 tablet (10 mg total) by mouth at bedtime. 90 tablet 3  . Multiple Vitamin (MULTIVITAMIN) tablet Take 1 tablet by mouth daily.    . ranitidine (ZANTAC) 300 MG tablet TAKE 1 TABLET(300 MG) BY MOUTH TWICE DAILY 180 tablet 1  . rizatriptan (MAXALT-MLT) 5 MG disintegrating tablet TAKE 1 TABLET BY MOUTH EVERY 2 HOURS. 10 tablet 6  . Simethicone (GAS-X PO) Take by mouth as needed.    . triamcinolone cream (KENALOG) 0.5 % Apply 1 application topically 2 (two) times daily. To affected areas. 30 g 3   No current facility-administered medications for this visit.    No Known Allergies   Discussed warning signs or symptoms. Please see discharge instructions. Patient expresses understanding.

## 2018-03-08 ENCOUNTER — Telehealth: Payer: Self-pay | Admitting: Family Medicine

## 2018-03-08 DIAGNOSIS — F411 Generalized anxiety disorder: Secondary | ICD-10-CM

## 2018-03-08 LAB — METANEPHRINES, URINE, 24 HOUR
Metaneph Total, Ur: 44 ug/L
Metanephrines, 24H Ur: 110 ug/24 hr (ref 45–290)
Normetanephrine, 24H Ur: 375 ug/24 hr (ref 82–500)
Normetanephrine, Ur: 150 ug/L

## 2018-03-08 NOTE — Telephone Encounter (Signed)
Pt advised. Verbalized understanding. No further questions.  

## 2018-03-08 NOTE — Telephone Encounter (Signed)
I sent referral to [facility] at [address] to Schedule patient in the [place] location. - CF

## 2018-03-08 NOTE — Telephone Encounter (Signed)
We just learned that we can schedule the therapist at our office. She starts in early February. I placed a referral for this.  You should hear about therapy referral soon.

## 2018-04-01 ENCOUNTER — Ambulatory Visit: Payer: Self-pay | Admitting: Family Medicine

## 2018-04-03 ENCOUNTER — Ambulatory Visit (INDEPENDENT_AMBULATORY_CARE_PROVIDER_SITE_OTHER): Payer: BC Managed Care – PPO | Admitting: Family Medicine

## 2018-04-03 ENCOUNTER — Encounter: Payer: Self-pay | Admitting: Family Medicine

## 2018-04-03 VITALS — BP 131/82 | HR 67 | Ht 72.0 in | Wt 251.0 lb

## 2018-04-03 DIAGNOSIS — Z6834 Body mass index (BMI) 34.0-34.9, adult: Secondary | ICD-10-CM

## 2018-04-03 DIAGNOSIS — L578 Other skin changes due to chronic exposure to nonionizing radiation: Secondary | ICD-10-CM | POA: Diagnosis not present

## 2018-04-03 DIAGNOSIS — L821 Other seborrheic keratosis: Secondary | ICD-10-CM | POA: Diagnosis not present

## 2018-04-03 DIAGNOSIS — I10 Essential (primary) hypertension: Secondary | ICD-10-CM

## 2018-04-03 DIAGNOSIS — R0683 Snoring: Secondary | ICD-10-CM

## 2018-04-03 DIAGNOSIS — F411 Generalized anxiety disorder: Secondary | ICD-10-CM

## 2018-04-03 MED ORDER — METOPROLOL SUCCINATE ER 50 MG PO TB24: 50.0000 mg | ORAL_TABLET | Freq: Every day | ORAL | 3 refills | Status: DC

## 2018-04-03 MED ORDER — METOPROLOL TARTRATE 25 MG PO TABS: 25.0000 mg | ORAL_TABLET | Freq: Two times a day (BID) | ORAL | 5 refills | Status: DC | PRN

## 2018-04-03 NOTE — Patient Instructions (Addendum)
Thank you for coming in today.  I do recommend that you follow up with Dr Leafy Ro in the near future for obesity.  [phone]  I do recommend that you also follow up with therapy.  Consider self guided CBT as well.   Recheck in 3 months.  Return sooner if needed.   You should hear about home sleep test soon.  Let me know if you do not hear anything.   Switch to extended release metoprolol at bedtime.    Sleep Apnea Sleep apnea is a condition in which breathing pauses or becomes shallow during sleep. Episodes of sleep apnea usually last 10 seconds or longer, and they may occur as many as 20 times an hour. Sleep apnea disrupts your sleep and keeps your body from getting the rest that it needs. This condition can increase your risk of certain health problems, including:  Heart attack.  Stroke.  Obesity.  Diabetes.  Heart failure.  Irregular heartbeat. There are three kinds of sleep apnea:  Obstructive sleep apnea. This kind is caused by a blocked or collapsed airway.  Central sleep apnea. This kind happens when the part of the brain that controls breathing does not send the correct signals to the muscles that control breathing.  Mixed sleep apnea. This is a combination of obstructive and central sleep apnea. What are the causes? The most common cause of this condition is a collapsed or blocked airway. An airway can collapse or become blocked if:  Your throat muscles are abnormally relaxed.  Your tongue and tonsils are larger than normal.  You are overweight.  Your airway is smaller than normal. What increases the risk? This condition is more likely to develop in people who:  Are overweight.  Smoke.  Have a smaller than normal airway.  Are elderly.  Are male.  Drink alcohol.  Take sedatives or tranquilizers.  Have a family history of sleep apnea. What are the signs or symptoms? Symptoms of this condition include:  Trouble staying asleep.  Daytime  sleepiness and tiredness.  Irritability.  Loud snoring.  Morning headaches.  Trouble concentrating.  Forgetfulness.  Decreased interest in sex.  Unexplained sleepiness.  Mood swings.  Personality changes.  Feelings of depression.  Waking up often during the night to urinate.  Dry mouth.  Sore throat. How is this diagnosed? This condition may be diagnosed with:  A medical history.  A physical exam.  A series of tests that are done while you are sleeping (sleep study). These tests are usually done in a sleep lab, but they may also be done at home. How is this treated? Treatment for this condition aims to restore normal breathing and to ease symptoms during sleep. It may involve managing health issues that can affect breathing, such as high blood pressure or obesity. Treatment may include:  Sleeping on your side.  Using a decongestant if you have nasal congestion.  Avoiding the use of depressants, including alcohol, sedatives, and narcotics.  Losing weight if you are overweight.  Making changes to your diet.  Quitting smoking.  Using a device to open your airway while you sleep, such as: ? An oral appliance. This is a custom-made mouthpiece that shifts your lower jaw forward. ? A continuous positive airway pressure (CPAP) device. This device delivers oxygen to your airway through a mask. ? A nasal expiratory positive airway pressure (EPAP) device. This device has valves that you put into each nostril. ? A bi-level positive airway pressure (BPAP) device. This device delivers  oxygen to your airway through a mask.  Surgery if other treatments do not work. During surgery, excess tissue is removed to create a wider airway. It is important to get treatment for sleep apnea. Without treatment, this condition can lead to:  High blood pressure.  Coronary artery disease.  (Men) An inability to achieve or maintain an erection (impotence).  Reduced thinking  abilities. Follow these instructions at home:  Make any lifestyle changes that your health care provider recommends.  Eat a healthy, well-balanced diet.  Take over-the-counter and prescription medicines only as told by your health care provider.  Avoid using depressants, including alcohol, sedatives, and narcotics.  Take steps to lose weight if you are overweight.  If you were given a device to open your airway while you sleep, use it only as told by your health care provider.  Do not use any tobacco products, such as cigarettes, chewing tobacco, and e-cigarettes. If you need help quitting, ask your health care provider.  Keep all follow-up visits as told by your health care provider. This is important. Contact a health care provider if:  The device that you received to open your airway during sleep is uncomfortable or does not seem to be working.  Your symptoms do not improve.  Your symptoms get worse. Get help right away if:  You develop chest pain.  You develop shortness of breath.  You develop discomfort in your back, arms, or stomach.  You have trouble speaking.  You have weakness on one side of your body.  You have drooping in your face. These symptoms may represent a serious problem that is an emergency. Do not wait to see if the symptoms will go away. Get medical help right away. Call your local emergency services (911 in the [place] Do not drive yourself to the hospital. This information is not intended to replace advice given to you by your health care provider. Make sure you discuss any questions you have with your health care provider. Document Released: 01/27/2002 Document Revised: 09/04/2016 Document Reviewed: 11/16/2014 Elsevier Interactive Patient Education  2019 [employer].    Cognitive Behavioral Therapy Cognitive behavioral therapy (CBT) is a short-term, goal-oriented type of talk therapy. CBT can help you:  Identify patterns of thinking, feeling,  and behaving that are causing you problems.  Decide how you want to think, feel, and respond to life events.  Set goals to change the beliefs and thoughts that cause you to act in ways that are not helpful for you.  Follow up on the changes that you make. What are the different types of CBT? The different types of CBT include:  Dialectical behavioral therapy (DBT). This approach is often used in group therapy, and it aids a person in managing behavior by focusing on: ? Things that cause problems to start (triggers). ? Methods of self-calming. ? Re-evaluating thinking processes.  Mindfulness-based cognitive therapy. This approach involves focusing your attention, meditating, and developing awareness of the present moment (mindfulness).  Rational emotive behavior therapy. This approach uses rational thought to reframe your thinking so it is less judgmental. Your therapist may directly challenge your thought processes.  Stress inoculation training. This approach involves planning ahead for stressful situations by practicing new thoughts and behaviors. This planning can help you avoid going back to old actions.  Acceptance and commitment therapy (ACT). This approach focuses on accepting yourself as you are and practicing mindfulness. It helps you understand what you would like to change and how you can set  goals in that direction. What conditions is CBT used to treat? CBT may help to treat:  Mental health conditions, including: ? Depression. ? Anxiety. ? Bipolar disorder. ? Eating disorders. ? Post-traumatic stress disorder (PTSD). ? Obsessive-compulsive disorder (OCD).  Insomnia and other sleep disorders.  Pain.  Stress.  Coping with loss or grief.  Coping with a difficult medical diagnosis or illness.  Relationship problems.  Emotional distress or shock (trauma). How can CBT help me? CBT may:  Give you a chance to share your thoughts, feelings, problems, and fears in a  safe space.  Help you focus on specific problems.  Give you homework that helps you put theory into practice. Homework may include keeping a journal or doing thinking exercises.  Help you become aware of your patterns of thinking, feeling, and behaving, and how those three patterns affect each other.  Change your thoughts so that you can change your behaviors.  Help you chose how you want to view the world.  Teach you planned coping skills and offer better ways to deal with stress and difficult situations. To make the most of CBT, make sure you:  Find a licensed therapist whom you trust.  Take an active part in your therapy and do the homework that you are given.  Are honest about your problems.  Avoid skipping your therapy sessions. Summary  Cognitive behavioral therapy (CBT) is a short-term, goal-oriented type of talk therapy.  CBT can help you become aware of your patterns and the relationships among your thoughts, feelings, and behavior.  CBT may help mental health conditions and other problems. This information is not intended to replace advice given to you by your health care provider. Make sure you discuss any questions you have with your health care provider. Document Released: 06/20/2016 Document Revised: 06/20/2016 Document Reviewed: 06/20/2016 Elsevier Interactive Patient Education  2019 [employer].

## 2018-04-03 NOTE — Progress Notes (Signed)
Brendan Neal is a 53 y.o. male who presents to [facility] today for follow-up hypertension, and anxiety.  Patient was last seen on January 15.  He had an episode of flushing and lightheadedness while flying.  He had follow-up with cardiology on January 10 and a normal stress echocardiogram on January 13.  At the visit on the 15th with me I suspected that the majority of his symptoms are probably coming from panic or anxiety.  However his blood pressure medications were adjusted.  Hypertension: Hydrochlorothiazide was discontinued and he was continued on metoprolol and he tolerated that better.  Additionally he had further testing with cardiology including fortunately normal urine metanephrine.  He had follow-up labs obtained over the last few days and is here for recheck.  He is currently taking metoprolol twice daily.  He notes a bit of fatigue with this medication.  Overall he thinks he tolerates it better than hydrochlorothiazide or lisinopril.  Anxiety: Slight improvement with improved worklife balance.  Patient has yet to schedule with counseling.  He has started exercising which he finds to be beneficial to help control his anxiety as well.  Additionally Brendan Neal notes a history of significant sun exposure during his childhood.  He has several concerning skin lesions that he like to address today.  He does not have a dermatologist.  Additionally he recognized that is that he is obese and is working with his husband as well to lose weight.  He is interested in medical supervised weight management if possible.  He thinks a multidisciplinary approach will be good.  Additionally he notes that he snores.  He is concerned that he may have sleep apnea especially given his fatigue and hypertension and is interested in a home sleep study.  ROS as above:  Exam:  BP 131/82   Pulse 67    Ht 6' (1.829 m)   Wt 251 lb (113.9 kg)   BMI 34.04 kg/m  Wt Readings from Last 5 Encounters:  04/03/18 251 lb (113.9 kg)  03/06/18 247 lb (112 kg)  03/01/18 247 lb (112 kg)  02/27/18 249 lb (112.9 kg)  01/30/18 255 lb (115.7 kg)    Gen: Well NAD HEENT: EOMI,  MMM Lungs: Normal work of breathing. CTABL Heart: RRR no MRG Abd: NABS, Soft. Nondistended, Nontender Exts: Brisk capillary refill, warm and well perfused.  Psych alert and oriented normal speech thought process and affect. Skin: Multiple cherry angiomas on trunk.  Several seborrheic keratoses present.  Small sebaceous cyst midline back.  No dysplastic appearing lesions present.  Depression screen [facility] 2/9 04/03/2018 02/27/2018 09/01/2016 09/23/2015  Decreased Interest 0 0 0 0  Down, Depressed, Hopeless 1 0 0 1  PHQ - 2 Score 1 0 0 1  Altered sleeping 1 0 - 1  Tired, decreased energy 1 1 - 1  Change in appetite 1 0 - 1  Feeling bad or failure about yourself  1 0 - 1  Trouble concentrating 0 0 - 0  Moving slowly or fidgety/restless 0 0 - 0  Suicidal thoughts 0 0 - 0  PHQ-9 Score 5 1 - 5  Difficult doing work/chores Somewhat difficult Not difficult at all - Somewhat difficult   GAD 7 : Generalized Anxiety Score 04/03/2018 02/27/2018 09/01/2016 09/23/2015  Nervous, Anxious, on Edge 1 0 1 1  Control/stop worrying 1 0 0 1  Worry too much - different things 1 0 1 1  Trouble relaxing  1 1 1 1   Restless 0 0 0 0  Easily annoyed or irritable 1 0 1 1  Afraid - awful might happen 0 0 0 1  Total GAD 7 Score 5 1 4 6   Anxiety Difficulty Somewhat difficult Not difficult at all - Somewhat difficult      Lab and Radiology Results Results for orders placed or performed in visit on 02/27/18 (from the past 72 hour(s))  CBC     Status: Abnormal   Collection Time: 04/02/18  7:20 AM  Result Value Ref Range   WBC 6.5 3.8 - 10.8 Thousand/uL   RBC 5.73 4.20 - 5.80 Million/uL   Hemoglobin 17.2 (H) 13.2 - 17.1 g/dL   HCT 51.3 (H) 38.5 - 50.0 %     MCV 89.5 80.0 - 100.0 fL   MCH 30.0 27.0 - 33.0 pg   MCHC 33.5 32.0 - 36.0 g/dL   RDW 12.3 11.0 - 15.0 %   Platelets 236 140 - 400 Thousand/uL   MPV 11.4 7.5 - 12.5 fL  COMPLETE METABOLIC PANEL WITH GFR     Status: Abnormal   Collection Time: 04/02/18  7:20 AM  Result Value Ref Range   Glucose, Bld 105 (H) 65 - 99 mg/dL    Comment: .            Fasting reference interval . For someone without known diabetes, a glucose value between 100 and 125 mg/dL is consistent with prediabetes and should be confirmed with a follow-up test. .    BUN 15 7 - 25 mg/dL   Creat 1.21 0.70 - 1.33 mg/dL    Comment: For patients >95 years of age, the reference limit for Creatinine is approximately 13% higher for people identified as African-American. .    GFR, Est Non African American 69 > OR = 60 mL/min/1.107m2   GFR, Est African American 80 > OR = 60 mL/min/1.74m2   BUN/Creatinine Ratio NOT APPLICABLE 6 - 22 (calc)   Sodium 141 135 - 146 mmol/L   Potassium 4.4 3.5 - 5.3 mmol/L   Chloride 105 98 - 110 mmol/L   CO2 26 20 - 32 mmol/L   Calcium 9.9 8.6 - 10.3 mg/dL   Total Protein 6.8 6.1 - 8.1 g/dL   Albumin 4.2 3.6 - 5.1 g/dL   Globulin 2.6 1.9 - 3.7 g/dL (calc)   AG Ratio 1.6 1.0 - 2.5 (calc)   Total Bilirubin 0.6 0.2 - 1.2 mg/dL   Alkaline phosphatase (APISO) 86 35 - 144 U/L   AST 14 10 - 35 U/L   ALT 16 9 - 46 U/L  TSH     Status: None   Collection Time: 04/02/18  7:20 AM  Result Value Ref Range   TSH 3.19 0.40 - 4.50 mIU/L  Lipid Panel w/reflex Direct LDL     Status: None   Collection Time: 04/02/18  7:20 AM  Result Value Ref Range   Cholesterol 175 <200 mg/dL   HDL 56 > OR = 40 mg/dL   Triglycerides 109 <150 mg/dL   LDL Cholesterol (Calc) 98 mg/dL (calc)    Comment: Reference range: <100 . Desirable range <100 mg/dL for primary prevention;   <70 mg/dL for patients with CHD or diabetic patients  with > or = 2 CHD risk factors. Marland Kitchen LDL-C is now calculated using the  Martin-Hopkins  calculation, which is a validated novel method providing  better accuracy than the Friedewald equation in the  estimation of LDL-C.  Cresenciano Genre  et al. JAMA. 7829;562(13): 2061-2068  (http://education.[employer].com/faq/FAQ164)    Total CHOL/HDL Ratio 3.1 <5.0 (calc)   Non-HDL Cholesterol (Calc) 119 <130 mg/dL (calc)    Comment: For patients with diabetes plus 1 major ASCVD risk  factor, treating to a non-HDL-C goal of <100 mg/dL  (LDL-C of <70 mg/dL) is considered a therapeutic  option.    No results found.    Assessment and Plan: 53 y.o. male with  Hypertension: Blood pressure somewhat controlled.  Labs as above reasonable.  Patient does have fatigue with twice daily dosing of metoprolol.  Reasonable to switch to daily dosing and take at bedtime.  Work on weight loss as noted below and work-up sleep apnea as noted below.  Recheck 3 months or sooner if needed.  Panic: Subjective improvement of anxiety symptoms.  GAD 7 today is a bit more elevated than it was previously however I suspect he was minimizing his symptoms on previous assessment.  Highly recommend attending counseling.  Consider medication.  Continue worklife balance improvement and exercise.  Recheck 3 months or sooner if needed.  Concern for sleep apnea: Aquil is high risk for sleep apnea.  He has daytime fatigue.  Plan for home sleep study.  Obesity: Significant medical problem.  Multiple comorbid factors.  Reasonable to follow-up with medical supervised weight management with multidisciplinary clinic.  Provided information for Dr. Laroy Apple exposure and seborrheic keratosis and cherry angioma.  Patient has multiple skin lesions on his trunk.  None are especially concerning today however given his history and exposure history I think is reasonable to have annual skin exams with dermatology.  Referral placed.  I spent 40 minutes with this patient, greater than 50% was face-to-face time counseling  regarding ddx and plan as above.   Orders Placed This Encounter  Procedures  . Ambulatory referral to Dermatology    Referral Priority:   Routine    Referral Type:   Consultation    Referral Reason:   Specialty Services Required    Requested Specialty:   Dermatology    Number of Visits Requested:   1  . Amb Ref to Medical Weight Management    Referral Priority:   Routine    Referral Type:   Consultation    Referred to Provider:   Starlyn Skeans, MD    Number of Visits Requested:   1  . Home sleep test    Standing Status:   Future    Standing Expiration Date:   04/04/2019    Order Specific Question:   Where should this test be performed:    Answer:   Other   Meds ordered this encounter  Medications  . DISCONTD: metoprolol tartrate (LOPRESSOR) 25 MG tablet    Sig: Take 1 tablet (25 mg total) by mouth 2 (two) times daily as needed.    Dispense:  60 tablet    Refill:  5  . metoprolol succinate (TOPROL-XL) 50 MG 24 hr tablet    Sig: Take 1 tablet (50 mg total) by mouth daily. Take with or immediately following a meal.    Dispense:  90 tablet    Refill:  3     Historical information moved to improve visibility of documentation.  Past Medical History:  Diagnosis Date  . Genital warts   . GERD (gastroesophageal reflux disease)   . Hypertension    Past Surgical History:  Procedure Laterality Date  . TONSILLECTOMY    . [place] EXTRACTION     Social History  Tobacco Use  . Smoking status: Never Smoker  . Smokeless tobacco: Never Used  Substance Use Topics  . Alcohol use: No   family history includes Alcohol abuse in his brother; Depression in his brother; Diabetes in his maternal aunt and paternal grandfather; High blood pressure in his father, maternal grandfather, maternal grandmother, mother, paternal grandfather, and paternal grandmother; Migraines in his father.  Medications: Current Outpatient Medications  Medication Sig Dispense Refill  . Calcium  Carbonate Antacid (TUMS PO) Take by mouth daily as needed.     . fluticasone (FLONASE) 50 MCG/ACT nasal spray Place 1 spray into both nostrils daily as needed for allergies or rhinitis.    Marland Kitchen loratadine (CLARITIN) 10 MG tablet Take 10 mg by mouth daily as needed.     . montelukast (SINGULAIR) 10 MG tablet Take 1 tablet (10 mg total) by mouth at bedtime. 90 tablet 3  . Multiple Vitamin (MULTIVITAMIN) tablet Take 1 tablet by mouth daily.    . ranitidine (ZANTAC) 300 MG tablet TAKE 1 TABLET(300 MG) BY MOUTH TWICE DAILY 180 tablet 1  . rizatriptan (MAXALT-MLT) 5 MG disintegrating tablet TAKE 1 TABLET BY MOUTH EVERY 2 HOURS. 10 tablet 6  . Simethicone (GAS-X PO) Take by mouth as needed.    . triamcinolone cream (KENALOG) 0.5 % Apply 1 application topically 2 (two) times daily. To affected areas. 30 g 3  . metoprolol succinate (TOPROL-XL) 50 MG 24 hr tablet Take 1 tablet (50 mg total) by mouth daily. Take with or immediately following a meal. 90 tablet 3   No current facility-administered medications for this visit.    No Known Allergies   Discussed warning signs or symptoms. Please see discharge instructions. Patient expresses understanding.

## 2018-04-04 LAB — CBC
HCT: 51.3 % — ABNORMAL HIGH (ref 38.5–50.0)
Hemoglobin: 17.2 g/dL — ABNORMAL HIGH (ref 13.2–17.1)
MCH: 30 pg (ref 27.0–33.0)
MCHC: 33.5 g/dL (ref 32.0–36.0)
MCV: 89.5 fL (ref 80.0–100.0)
MPV: 11.4 fL (ref 7.5–12.5)
PLATELETS: 236 10*3/uL (ref 140–400)
RBC: 5.73 10*6/uL (ref 4.20–5.80)
RDW: 12.3 % (ref 11.0–15.0)
WBC: 6.5 10*3/uL (ref 3.8–10.8)

## 2018-04-04 LAB — COMPLETE METABOLIC PANEL WITH GFR
AG RATIO: 1.6 (calc) (ref 1.0–2.5)
ALBUMIN MSPROF: 4.2 g/dL (ref 3.6–5.1)
ALT: 16 U/L (ref 9–46)
AST: 14 U/L (ref 10–35)
Alkaline phosphatase (APISO): 86 U/L (ref 35–144)
BILIRUBIN TOTAL: 0.6 mg/dL (ref 0.2–1.2)
BUN: 15 mg/dL (ref 7–25)
CO2: 26 mmol/L (ref 20–32)
Calcium: 9.9 mg/dL (ref 8.6–10.3)
Chloride: 105 mmol/L (ref 98–110)
Creat: 1.21 mg/dL (ref 0.70–1.33)
GFR, EST AFRICAN AMERICAN: 80 mL/min/{1.73_m2} (ref >=60)
GFR, EST NON AFRICAN AMERICAN: 69 mL/min/{1.73_m2} (ref >=60)
Globulin: 2.6 g/dL (calc) (ref 1.9–3.7)
Glucose, Bld: 105 mg/dL — ABNORMAL HIGH (ref 65–99)
POTASSIUM: 4.4 mmol/L (ref 3.5–5.3)
Sodium: 141 mmol/L (ref 135–146)
TOTAL PROTEIN: 6.8 g/dL (ref 6.1–8.1)

## 2018-04-04 LAB — LIPID PANEL W/REFLEX DIRECT LDL
Cholesterol: 175 mg/dL (ref <200)
HDL: 56 mg/dL (ref >=40)
LDL Cholesterol (Calc): 98 mg/dL (calc)
Non-HDL Cholesterol (Calc): 119 mg/dL (calc) (ref <130)
Total CHOL/HDL Ratio: 3.1 (calc) (ref <5.0)
Triglycerides: 109 mg/dL (ref <150)

## 2018-04-04 LAB — TSH: TSH: 3.19 m[IU]/L (ref 0.40–4.50)

## 2018-04-04 LAB — ENDOMYSIAL AB IGA RFLX TITER: ENDOMYSIAL AB IGA: NEGATIVE

## 2018-04-14 ENCOUNTER — Other Ambulatory Visit: Payer: Self-pay | Admitting: Family Medicine

## 2018-05-23 ENCOUNTER — Encounter: Payer: Self-pay | Admitting: Family Medicine

## 2018-05-23 MED ORDER — FAMOTIDINE 20 MG PO TABS: 20.0000 mg | ORAL_TABLET | Freq: Two times a day (BID) | ORAL | 1 refills | Status: DC

## 2018-06-28 ENCOUNTER — Encounter: Payer: Self-pay | Admitting: Family Medicine

## 2018-07-01 ENCOUNTER — Ambulatory Visit: Payer: Self-pay | Admitting: Family Medicine

## 2018-07-03 ENCOUNTER — Telehealth (INDEPENDENT_AMBULATORY_CARE_PROVIDER_SITE_OTHER): Payer: BC Managed Care – PPO | Admitting: Family Medicine

## 2018-07-03 ENCOUNTER — Encounter: Payer: Self-pay | Admitting: Family Medicine

## 2018-07-03 ENCOUNTER — Ambulatory Visit: Payer: BC Managed Care – PPO | Admitting: Family Medicine

## 2018-07-03 VITALS — BP 130/88 | Wt 250.0 lb

## 2018-07-03 DIAGNOSIS — Z6833 Body mass index (BMI) 33.0-33.9, adult: Secondary | ICD-10-CM | POA: Diagnosis not present

## 2018-07-03 DIAGNOSIS — F411 Generalized anxiety disorder: Secondary | ICD-10-CM | POA: Diagnosis not present

## 2018-07-03 DIAGNOSIS — I10 Essential (primary) hypertension: Secondary | ICD-10-CM | POA: Diagnosis not present

## 2018-07-03 DIAGNOSIS — E6609 Other obesity due to excess calories: Secondary | ICD-10-CM | POA: Diagnosis not present

## 2018-07-03 NOTE — Patient Instructions (Addendum)
Thank you for coming in today.  I do recommend that you follow up with Dr Leafy Ro in the near future for obesity.  [phone]  For anxiety we can consider therapy.   Let me know what you decide about anxiety.   I do recommend increasing exercise.   I do recommend you contact Dr Knox Royalty office about a home sleep study. I think in lab is more dangerous than home study now with COVID-19. Dionne Milo, [place]  [place], [place] [address]  Phone: [phone]  Fax: [phone]

## 2018-07-03 NOTE — Progress Notes (Signed)
Virtual Visit  via Video Note  I connected with      Brendan Neal by a video enabled telemedicine application and verified that I am speaking with the correct person using two identifiers.   I discussed the limitations of evaluation and management by telemedicine and the availability of in person appointments. The patient expressed understanding and agreed to proceed.  History of Present Illness: Brendan Neal is a 53 y.o. male who would like to discuss hypertension obesity anxiety.   Hypertension: Currently managed with metoprolol twice daily.  He had difficulty tolerating hydrochlorothiazide and lisinopril.  Anxiety: Chronic problem.  Patient has baseline anxiety and has increased work demands.  He has had stressors for this in the past.  He had episodes of flushing and lightheadedness thought to be related to panic attacks. Brendan Neal notes increased work demands.  He notes he is trying to manage with exercise around the house as he is no longer able to swim.  He is also thinking about getting an exercise bike. He also was referred to behavioral health counseling at the last visit but got overwhelmed with COVID-19 and increased job duties and never scheduled.  He is interested in retrying counseling as well.  Obesity: Patient has had difficulty with obesity in the past and has gained weight.  In February plan was for referral to medical obesity management multidisciplinary clinic.  He notes he was unable to schedule due to COVID-19.  He still interested and would like to pursue that further if possible.  He notes he has considered increasing exercise as above.   Observations/Objective: BP 130/88 Comment: Pt reports this an average  Wt 250 lb (113.4 kg)   BMI 33.91 kg/m  Wt Readings from Last 5 Encounters:  07/03/18 250 lb (113.4 kg)  04/03/18 251 lb (113.9 kg)  03/06/18 247 lb (112 kg)  03/01/18 247 lb (112 kg)  02/27/18 249 lb (112.9 kg)   Exam: Appearance nontoxic no acute  distress Normal Speech.  Psych alert and oriented normal speech thought process and affect.    Depression screen [facility] 2/9 07/03/2018 04/03/2018 02/27/2018 09/01/2016 09/23/2015  Decreased Interest 1 0 0 0 0  Down, Depressed, Hopeless 1 1 0 0 1  PHQ - 2 Score 2 1 0 0 1  Altered sleeping 1 1 0 - 1  Tired, decreased energy 1 1 1  - 1  Change in appetite 1 1 0 - 1  Feeling bad or failure about yourself  1 1 0 - 1  Trouble concentrating 1 0 0 - 0  Moving slowly or fidgety/restless 0 0 0 - 0  Suicidal thoughts 0 0 0 - 0  PHQ-9 Score 7 5 1  - 5  Difficult doing work/chores Somewhat difficult Somewhat difficult Not difficult at all - Somewhat difficult   GAD 7 : Generalized Anxiety Score 07/03/2018 04/03/2018 02/27/2018 09/01/2016  Nervous, Anxious, on Edge 1 1 0 1  Control/stop worrying 1 1 0 0  Worry too much - different things 1 1 0 1  Trouble relaxing 1 1 1 1   Restless 0 0 0 0  Easily annoyed or irritable 1 1 0 1  Afraid - awful might happen 1 0 0 0  Total GAD 7 Score 6 5 1 4   Anxiety Difficulty Somewhat difficult Somewhat difficult Not difficult at all -       Assessment and Plan: 53 y.o. male with hypertension: Reasonably controlled continue current regimen.  Obesity: Reasonable to follow-up with medical supervised weight  management clinic.  Work on exercise and improve diet.  Recheck if not improving.  Anxiety: Slightly worsening.  Discussed several different options.  Plan for referral to behavioral health for counseling.  Patient reluctant to consider medication  PDMP not reviewed this encounter. Orders Placed This Encounter  Procedures  . Ambulatory referral to Behavioral Health    Referral Priority:   Routine    Referral Type:   Psychiatric    Referral Reason:   Specialty Services Required    Requested Specialty:   Behavioral Health    Number of Visits Requested:   1   No orders of the defined types were placed in this encounter.   Follow Up Instructions:    I discussed  the assessment and treatment plan with the patient. The patient was provided an opportunity to ask questions and all were answered. The patient agreed with the plan and demonstrated an understanding of the instructions.   The patient was advised to call back or seek an in-person evaluation if the symptoms worsen or if the condition fails to improve as anticipated.  Time: 25 minutes of intraservice time, with >39 minutes of total time during today's visit.      Historical information moved to improve visibility of documentation.  Past Medical History:  Diagnosis Date  . Genital warts   . GERD (gastroesophageal reflux disease)   . Hypertension    Past Surgical History:  Procedure Laterality Date  . TONSILLECTOMY    . WISDOM TOOTH EXTRACTION     Social History   Tobacco Use  . Smoking status: Never Smoker  . Smokeless tobacco: Never Used  Substance Use Topics  . Alcohol use: No   family history includes Alcohol abuse in his brother; Depression in his brother; Diabetes in his maternal aunt and paternal grandfather; High blood pressure in his father, maternal grandfather, maternal grandmother, mother, paternal grandfather, and paternal grandmother; Migraines in his father.  Medications: Current Outpatient Medications  Medication Sig Dispense Refill  . Calcium Carbonate Antacid (TUMS PO) Take by mouth daily as needed.     . famotidine (PEPCID) 20 MG tablet Take 1 tablet (20 mg total) by mouth 2 (two) times daily. 180 tablet 1  . fluticasone (FLONASE) 50 MCG/ACT nasal spray Place 1 spray into both nostrils daily as needed for allergies or rhinitis.    Marland Kitchen loratadine (CLARITIN) 10 MG tablet Take 10 mg by mouth daily as needed.     . metoprolol succinate (TOPROL-XL) 50 MG 24 hr tablet Take 1 tablet (50 mg total) by mouth daily. Take with or immediately following a meal. 90 tablet 3  . Multiple Vitamin (MULTIVITAMIN) tablet Take 1 tablet by mouth daily.    . rizatriptan (MAXALT-MLT) 5 MG  disintegrating tablet TAKE 1 TABLET BY MOUTH EVERY 2 HOURS AS DIRECTED 10 tablet 6  . Simethicone (GAS-X PO) Take by mouth as needed.    . triamcinolone cream (KENALOG) 0.5 % Apply 1 application topically 2 (two) times daily. To affected areas. 30 g 3   No current facility-administered medications for this visit.    No Known Allergies

## 2018-07-30 ENCOUNTER — Encounter: Payer: Self-pay | Admitting: Family Medicine

## 2018-08-07 ENCOUNTER — Ambulatory Visit: Payer: BC Managed Care – PPO | Admitting: Psychology

## 2018-08-07 ENCOUNTER — Ambulatory Visit (INDEPENDENT_AMBULATORY_CARE_PROVIDER_SITE_OTHER): Payer: BC Managed Care – PPO | Admitting: Psychology

## 2018-08-07 DIAGNOSIS — F418 Other specified anxiety disorders: Secondary | ICD-10-CM

## 2018-08-20 ENCOUNTER — Ambulatory Visit (INDEPENDENT_AMBULATORY_CARE_PROVIDER_SITE_OTHER): Payer: BC Managed Care – PPO | Admitting: Psychology

## 2018-08-20 DIAGNOSIS — F418 Other specified anxiety disorders: Secondary | ICD-10-CM | POA: Diagnosis not present

## 2018-09-04 ENCOUNTER — Ambulatory Visit (INDEPENDENT_AMBULATORY_CARE_PROVIDER_SITE_OTHER): Payer: BC Managed Care – PPO | Admitting: Psychology

## 2018-09-04 DIAGNOSIS — F418 Other specified anxiety disorders: Secondary | ICD-10-CM

## 2018-09-27 ENCOUNTER — Other Ambulatory Visit: Payer: Self-pay

## 2018-09-27 ENCOUNTER — Encounter: Payer: Self-pay | Admitting: Family Medicine

## 2018-09-27 MED ORDER — FAMOTIDINE 20 MG PO TABS: 20.0000 mg | ORAL_TABLET | Freq: Two times a day (BID) | ORAL | 1 refills | Status: DC

## 2018-11-15 IMAGING — DX DG CHEST 2V
2 series · 2 of 2 positions shown · non-contrast
Comparison: 10/02/2014.

CLINICAL DATA: Patient states intermittent cough with wheezing for
1.5 weeks

EXAM:
CHEST  2 VIEW

[chest pa]
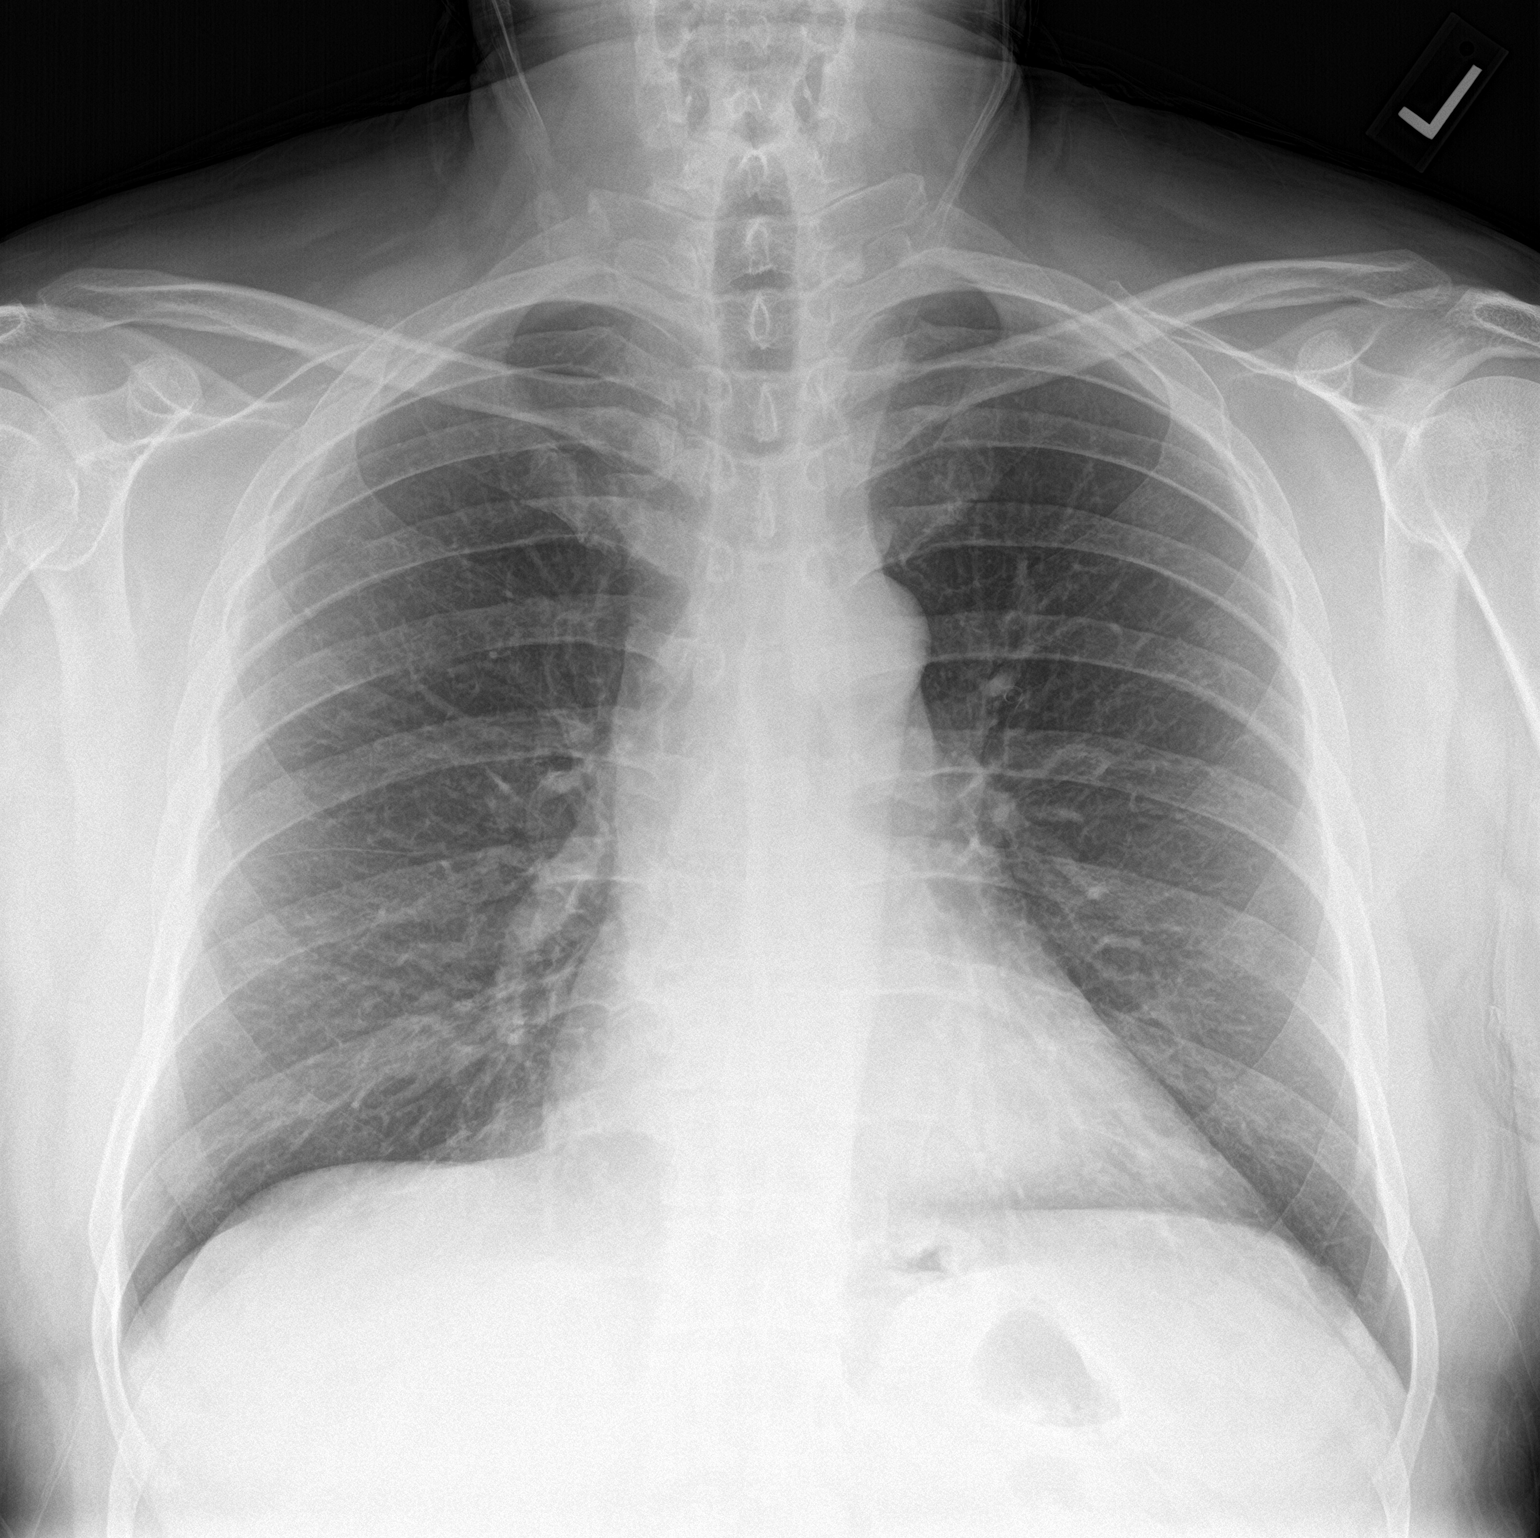

[chest lat]
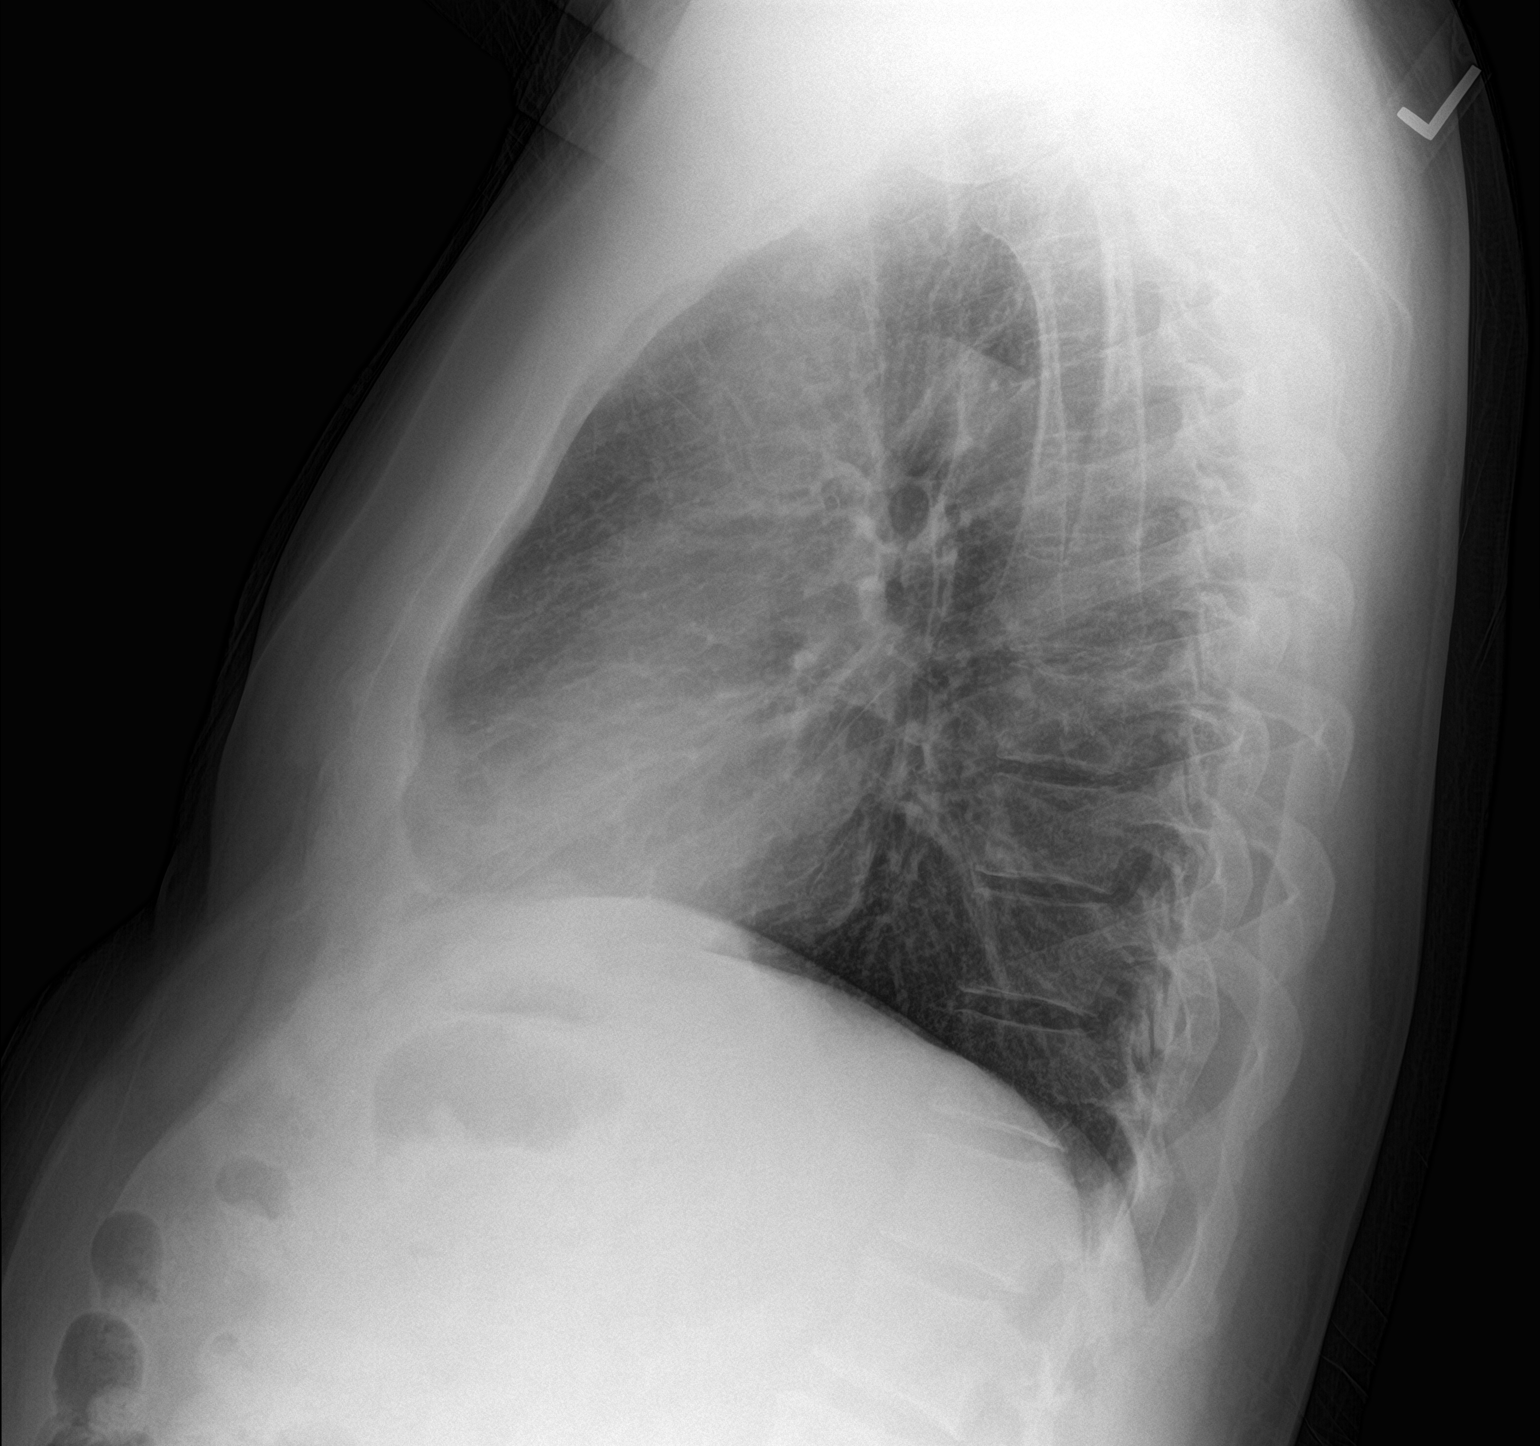

[2 of 2 positions shown; findings below may reference images not displayed]

FINDINGS: The heart size and mediastinal contours are within normal limits.
Both lungs are clear. The visualized skeletal structures are
unremarkable.
IMPRESSION: No active cardiopulmonary disease.  Stable exam.

## 2018-12-19 ENCOUNTER — Encounter: Payer: Self-pay | Admitting: Family Medicine

## 2018-12-20 NOTE — Telephone Encounter (Signed)
New PCP to be Dr.Alexander. A note has been made for patient.

## 2019-01-28 ENCOUNTER — Encounter: Payer: Self-pay | Admitting: Physician Assistant

## 2019-01-28 ENCOUNTER — Ambulatory Visit (INDEPENDENT_AMBULATORY_CARE_PROVIDER_SITE_OTHER): Payer: BC Managed Care – PPO | Admitting: Physician Assistant

## 2019-01-28 VITALS — BP 141/89 | HR 71 | Temp 97.6°F | Ht 72.5 in

## 2019-01-28 DIAGNOSIS — I10 Essential (primary) hypertension: Secondary | ICD-10-CM | POA: Diagnosis not present

## 2019-01-28 DIAGNOSIS — M5416 Radiculopathy, lumbar region: Secondary | ICD-10-CM

## 2019-01-28 MED ORDER — HYDROCHLOROTHIAZIDE 25 MG PO TABS: 25.0000 mg | ORAL_TABLET | Freq: Every day | ORAL | 0 refills | Status: DC

## 2019-01-28 MED ORDER — METHOCARBAMOL 500 MG PO TABS: 500.0000 mg | ORAL_TABLET | Freq: Four times a day (QID) | ORAL | 0 refills | Status: DC | PRN

## 2019-01-28 MED ORDER — PREDNISONE 50 MG PO TABS: 50.0000 mg | ORAL_TABLET | Freq: Every day | ORAL | 0 refills | Status: AC

## 2019-01-28 NOTE — Progress Notes (Signed)
Virtual Visit via Video (App used: Doximity) Note  I connected with      Brendan Neal on 01/30/19 at 2:17 PM  by a telemedicine application and verified that I am speaking with the correct person using two identifiers.  Patient is at residence in [place], [place] I am in office   I discussed the limitations of evaluation and management by telemedicine and the availability of in person appointments. The patient expressed understanding and agreed to proceed.  History of Present Illness: Brendan Neal is a 53 y.o. male who would like to discuss back pain   Sudden onset left-sided LBP today. Pain occurred while changing positions quickly from sitting to standing. Immediately had pain, left-leg stiffness and was unable to bend over or walk very well. Pain radiates into the left buttock down the thigh to the back of the knee Denies saddle numbness, no bowel/bladder incontinence. No fever/constitutional symptoms Reports he has a sedentary lifestyle and sits all day for his fulltime job. He is able to get up to use the bathroom with assistance of partner and a cane No hx of trauma. No known injury.  Reports for the last several months he has had intermittent numbness of his anterior medial thigh with prolonged standing.  He also states that his home BP has been elevated this year SBP 140's-150's/80's-100's. Currently takes Toprol XL 50 mg at bedtime.   Observations/Objective: BP (!) 141/89   Pulse 71   Temp 97.6 F (36.4 C) (Oral)   Ht 6' 0.5" (1.842 m)   BMI 33.44 kg/m  BP Readings from Last 3 Encounters:  01/30/19 (!) 135/97  01/28/19 (!) 141/89  07/03/18 130/88   Exam limited by telehealth: VS reviewed General: Alert, laying flat in bed, no acute distress Pulmonary: Normal work of breathing, normal phonation Psych: cooperative, speech is articulate, normal rate and volume; thought processes clear and goal-directed, normal judgment, good insight   Lab and Radiology Results No  results found for this or any previous visit (from the past 72 hour(s)). No results found.  Lab Results  Component Value Date   CREATININE 1.21 04/02/2018   BUN 15 04/02/2018   NA 141 04/02/2018   K 4.4 04/02/2018   CL 105 04/02/2018   CO2 26 04/02/2018      Assessment and Plan: 53 y.o. male with The primary encounter diagnosis was Acute left lumbar radiculopathy. A diagnosis of Hypertension goal BP (blood pressure) < 130/80 was also pertinent to this visit.  .Diagnoses and all orders for this visit:  Acute left lumbar radiculopathy -     predniSONE (DELTASONE) 50 MG tablet; Take 1 tablet (50 mg total) by mouth daily with breakfast for 5 days. -     methocarbamol (ROBAXIN) 500 MG tablet; Take 1-2 tablets (500-1,000 mg total) by mouth 4 (four) times daily as needed for muscle spasms (back pain).  Hypertension goal BP (blood pressure) < 130/80 -     hydrochlorothiazide (HYDRODIURIL) 25 MG tablet; Take 1 tablet (25 mg total) by mouth daily.   Left lumbar radiculopathy Unable to perform physical exam due to telehealth visit.  Patient declined in person evaluation today. No red flag symptoms Counseled to monitor for saddle numbness, bowel or bladder dysfunction, progressive weakness, and fever Start prednisone burst for 5 days and muscle relaxer as needed Counseled on potential adverse effects of steroid burst including worsening hypertension.  Prescription for hydrochlorothiazide to help control blood pressure.  He has tolerated this medication in the past.  We discussed that with his blood pressure above goal he may wish to discuss continuing thiazide diuretic with his PCP  Scheduled for an office appointment with sports medicine in 48 hours  PDMP not reviewed this encounter. No orders of the defined types were placed in this encounter.   Instructions sent via MyChart. If MyChart not available, pt was given option for info via personal e-mail w/ no guarantee of protected health  info over unsecured e-mail communication, and MyChart sign-up instructions were sent to patient.   Follow Up Instructions: No follow-ups on file.    I discussed the assessment and treatment plan with the patient. The patient was provided an opportunity to ask questions and all were answered. The patient agreed with the plan and demonstrated an understanding of the instructions.   The patient was advised to call back or seek an in-person evaluation if any new concerns, if symptoms worsen or if the condition fails to improve as anticipated.  15 minutes of non-face-to-face time was provided during this encounter.      . . . . . . . . . . . . . Marland Kitchen                   Historical information moved to improve visibility of documentation.  Past Medical History:  Diagnosis Date  . Genital warts   . GERD (gastroesophageal reflux disease)   . Hypertension    Past Surgical History:  Procedure Laterality Date  . TONSILLECTOMY    . WISDOM TOOTH EXTRACTION     Social History   Tobacco Use  . Smoking status: Never Smoker  . Smokeless tobacco: Never Used  Substance Use Topics  . Alcohol use: No   family history includes Alcohol abuse in his brother; Depression in his brother; Diabetes in his maternal aunt and paternal grandfather; High blood pressure in his father, maternal grandfather, maternal grandmother, mother, paternal grandfather, and paternal grandmother; Migraines in his father.  Medications: Current Outpatient Medications  Medication Sig Dispense Refill  . Calcium Carbonate Antacid (TUMS PO) Take by mouth daily as needed.     . famotidine (PEPCID) 20 MG tablet Take 1 tablet (20 mg total) by mouth 2 (two) times daily. 180 tablet 1  . fluticasone (FLONASE) 50 MCG/ACT nasal spray Place 1 spray into both nostrils daily as needed for allergies or rhinitis.    Marland Kitchen loratadine (CLARITIN) 10 MG tablet Take 10 mg by mouth daily as needed.     . metoprolol  succinate (TOPROL-XL) 50 MG 24 hr tablet Take 1 tablet (50 mg total) by mouth daily. Take with or immediately following a meal. 90 tablet 3  . Multiple Vitamin (MULTIVITAMIN) tablet Take 1 tablet by mouth daily.    . rizatriptan (MAXALT-MLT) 5 MG disintegrating tablet TAKE 1 TABLET BY MOUTH EVERY 2 HOURS AS DIRECTED 10 tablet 6  . Simethicone (GAS-X PO) Take by mouth as needed.    . triamcinolone cream (KENALOG) 0.5 % Apply 1 application topically 2 (two) times daily. To affected areas. 30 g 3  . hydrochlorothiazide (HYDRODIURIL) 25 MG tablet Take 1 tablet (25 mg total) by mouth daily. 30 tablet 0  . methocarbamol (ROBAXIN) 500 MG tablet Take 1-2 tablets (500-1,000 mg total) by mouth 4 (four) times daily as needed for muscle spasms (back pain). 60 tablet 0  . predniSONE (DELTASONE) 50 MG tablet Take 1 tablet (50 mg total) by mouth daily with breakfast for 5 days. 5 tablet 0   No current  facility-administered medications for this visit.   No Known Allergies

## 2019-01-30 ENCOUNTER — Ambulatory Visit (INDEPENDENT_AMBULATORY_CARE_PROVIDER_SITE_OTHER): Payer: BC Managed Care – PPO

## 2019-01-30 ENCOUNTER — Other Ambulatory Visit: Payer: Self-pay

## 2019-01-30 ENCOUNTER — Encounter: Payer: Self-pay | Admitting: Physician Assistant

## 2019-01-30 ENCOUNTER — Ambulatory Visit: Payer: BC Managed Care – PPO | Admitting: Sports Medicine

## 2019-01-30 ENCOUNTER — Encounter: Payer: Self-pay | Admitting: Sports Medicine

## 2019-01-30 DIAGNOSIS — M5416 Radiculopathy, lumbar region: Secondary | ICD-10-CM

## 2019-01-30 DIAGNOSIS — M5135 Other intervertebral disc degeneration, thoracolumbar region: Secondary | ICD-10-CM | POA: Insufficient documentation

## 2019-01-30 DIAGNOSIS — M51369 Other intervertebral disc degeneration, lumbar region without mention of lumbar back pain or lower extremity pain: Secondary | ICD-10-CM

## 2019-01-30 DIAGNOSIS — M5136 Other intervertebral disc degeneration, lumbar region: Secondary | ICD-10-CM | POA: Diagnosis not present

## 2019-01-30 HISTORY — DX: Other intervertebral disc degeneration, lumbar region: M51.36

## 2019-01-30 HISTORY — DX: Other intervertebral disc degeneration, lumbar region without mention of lumbar back pain or lower extremity pain: M51.369

## 2019-01-30 HISTORY — DX: Radiculopathy, lumbar region: M54.16

## 2019-01-30 NOTE — Progress Notes (Signed)
Subjective:    I'm seeing this patient as a consultation for: Nelson Chimes, PA-C  CC: Low back pain  HPI: This is a pleasant 53 year old male, for the past few days he is in severe low back pain, axial, discogenic, worse with sitting, flexion, Valsalva, with radiation down the left leg to the knee but not past.  No paresthesias into the foot or toe.  No bowel or bladder dysfunction, saddle numbness, constitutional symptoms, he was started on muscle relaxer and prednisone by Eduard Clos, this is starting to improve significantly.  I reviewed the past medical history, family history, social history, surgical history, and allergies today and no changes were needed.  Please see the problem list section below in epic for further details.  Past Medical History: Past Medical History:  Diagnosis Date  . Genital warts   . GERD (gastroesophageal reflux disease)   . Hypertension    Past Surgical History: Past Surgical History:  Procedure Laterality Date  . TONSILLECTOMY    . WISDOM TOOTH EXTRACTION     Social History: Social History   Socioeconomic History  . Marital status: Married    Spouse name: Not on file  . Number of children: Not on file  . Years of education: Not on file  . Highest education level: Not on file  Occupational History  . Not on file  Tobacco Use  . Smoking status: Never Smoker  . Smokeless tobacco: Never Used  Substance and Sexual Activity  . Alcohol use: No  . Drug use: No  . Sexual activity: Yes  Other Topics Concern  . Not on file  Social History Narrative  . Not on file   Social Determinants of Health   Financial Resource Strain:   . Difficulty of Paying Living Expenses: Not on file  Food Insecurity:   . Worried About Charity fundraiser in the Last Year: Not on file  . Ran Out of Food in the Last Year: Not on file  Transportation Needs:   . Lack of Transportation (Medical): Not on file  . Lack of Transportation (Non-Medical): Not on file   Physical Activity:   . Days of Exercise per Week: Not on file  . Minutes of Exercise per Session: Not on file  Stress:   . Feeling of Stress : Not on file  Social Connections:   . Frequency of Communication with Friends and Family: Not on file  . Frequency of Social Gatherings with Friends and Family: Not on file  . Attends Religious Services: Not on file  . Active Member of Clubs or Organizations: Not on file  . Attends Archivist Meetings: Not on file  . Marital Status: Not on file   Family History: Family History  Problem Relation Age of Onset  . Alcohol abuse Brother   . Depression Brother   . Diabetes Maternal Aunt   . High blood pressure Mother   . Migraines Father   . High blood pressure Father   . High blood pressure Maternal Grandmother   . High blood pressure Maternal Grandfather   . High blood pressure Paternal Grandmother   . Diabetes Paternal Grandfather   . High blood pressure Paternal Grandfather    Allergies: No Known Allergies Medications: See med rec.  Review of Systems: No headache, visual changes, nausea, vomiting, diarrhea, constipation, dizziness, abdominal pain, skin rash, fevers, chills, night sweats, weight loss, swollen lymph nodes, body aches, joint swelling, muscle aches, chest pain, shortness of breath, mood changes, visual  or auditory hallucinations.   Objective:   General: Well Developed, well nourished, and in no acute distress.  Neuro:  Extra-ocular muscles intact, able to move all 4 extremities, sensation grossly intact.  Deep tendon reflexes tested were normal. Psych: Alert and oriented, mood congruent with affect. ENT:  Ears and nose appear unremarkable.  Hearing grossly normal. Neck: Unremarkable overall appearance, trachea midline.  No visible thyroid enlargement. Eyes: Conjunctivae and lids appear unremarkable.  Pupils equal and round. Skin: Warm and dry, no rashes noted.  Cardiovascular: Pulses palpable, no extremity edema.  Back Exam:  Inspection: Unremarkable  Motion: Flexion 45 deg, Extension 45 deg, Side Bending to 45 deg bilaterally,  Rotation to 45 deg bilaterally  SLR laying: Negative  XSLR laying: Negative  Palpable tenderness: Range of motion limited by pain, otherwise no discrete areas of tenderness to palpation. FABER: negative. Sensory change: Gross sensation intact to all lumbar and sacral dermatomes.  Reflexes: 2+ at both patellar tendons, 2+ at achilles tendons, Babinski's downgoing.  Strength at foot  Plantar-flexion: 5/5 Dorsi-flexion: 5/5 Eversion: 5/5 Inversion: 5/5  Leg strength  Quad: 5/5 Hamstring: 5/5 Hip flexor: 5/5 Hip abductors: 5/5  Gait unremarkable.  Impression and Recommendations:   This case required medical decision making of moderate complexity.  Lumbar degenerative disc disease Acute axial discogenic back pain. Doing much better with prednisone, adding x-rays, formal PT for 6 weeks, we discussed the evolutionary anthropology of degenerative disc disease, the prognosis and the treatment plan. Return in 6 weeks.   ___________________________________________ Gwen Her. Dianah Field, M.D., ABFM., CAQSM. Primary Care and Sports Medicine [facility]  Adjunct Professor of [employer]

## 2019-01-30 NOTE — Assessment & Plan Note (Signed)
Acute axial discogenic back pain. Doing much better with prednisone, adding x-rays, formal PT for 6 weeks, we discussed the evolutionary anthropology of degenerative disc disease, the prognosis and the treatment plan. Return in 6 weeks.

## 2019-02-06 ENCOUNTER — Ambulatory Visit (INDEPENDENT_AMBULATORY_CARE_PROVIDER_SITE_OTHER): Payer: BC Managed Care – PPO | Admitting: Physical Therapy

## 2019-02-06 ENCOUNTER — Encounter: Payer: Self-pay | Admitting: Physical Therapy

## 2019-02-06 ENCOUNTER — Other Ambulatory Visit: Payer: Self-pay

## 2019-02-06 DIAGNOSIS — R262 Difficulty in walking, not elsewhere classified: Secondary | ICD-10-CM | POA: Diagnosis not present

## 2019-02-06 DIAGNOSIS — M5442 Lumbago with sciatica, left side: Secondary | ICD-10-CM | POA: Diagnosis not present

## 2019-02-06 DIAGNOSIS — M6281 Muscle weakness (generalized): Secondary | ICD-10-CM

## 2019-02-06 NOTE — Therapy (Addendum)
[facility] [place], [place], [address] Phone: [phone]   Fax:  [phone]  Physical Therapy Evaluation  Patient Details  Name: Brendan Neal MRN: [id number] Date of Birth: 21-Jun-1965 No data recorded  Encounter Date: 02/06/2019  PT End of Session - 02/06/19 1310    Visit Number  1    Number of Visits  13    Date for PT Re-Evaluation  03/06/19    PT Start Time  0845    PT Stop Time  0945    PT Time Calculation (min)  60 min    Activity Tolerance  Patient tolerated treatment well    Behavior During Therapy  [facility] for tasks assessed/performed       Past Medical History:  Diagnosis Date  . Genital warts   . GERD (gastroesophageal reflux disease)   . Hypertension     Past Surgical History:  Procedure Laterality Date  . TONSILLECTOMY    . WISDOM TOOTH EXTRACTION      There were no vitals filed for this visit.   Subjective Assessment - 02/06/19 0851    Subjective  Pt arriving to therapy reported sudden onset of low back pain when standing up from his desk at home. This episode occurred around 01/27/2019. Pt reporting his pain occurred about working in his basement lifting heavy objects. Pt reports he has been working from home since March and more sedentary since the pandemic started.    Patient is accompained by:  Family member   partner   Pertinent History  allergies, HTN    Patient Stated Goals  Return to PLOF, "Get back to normal"    Currently in Pain?  Yes    Pain Score  1     Pain Location  Back    Pain Orientation  Lower    Pain Descriptors / Indicators  Other (Comment);Shooting   flash of pain with transferring   Pain Type  Acute pain    Pain Radiating Towards  L sided buttock and hamstring, pt reporting initially pain ran into his 3rd and 4th toes, Pt reporting now pt is ascending.    Pain Onset  1 to 4 weeks ago    Pain Frequency  Constant    Aggravating Factors   sitting long periods,  transitions    Pain Relieving Factors  changing positions    Effect of Pain on Daily Activities  difficulty with sleeping, unable to tie shoes or socks, can't pick up objects off the floor. Pt having difficulty with ADL's. Car transfers are difficulty requring assistance         [facility] PT Assessment - 02/06/19 0001      Assessment   Medical Diagnosis  Low back pain    Hand Dominance  Right    Prior Therapy  yes, for cervical pain      Precautions   Precautions  None      Balance Screen   Has the patient fallen in the past 6 months  No    Is the patient reluctant to leave their home because of a fear of falling?   No      Home Film/video editor residence    Living Arrangements  Spouse/significant other    [place] entrance    [place]archment - single point;Grab bars - tub/shower      Prior Function  Level of Independence  Independent    Vocation  Full time employment    Vocation Requirements  works from home sitting on zoom calls    Leisure  walk, bike, swim, read      Cognition   Overall Cognitive Status  Within Functional Limits for tasks assessed      Observation/Other Assessments   Focus on Therapeutic Outcomes (FOTO)   90% limitation      Posture/Postural Control   Posture/Postural Control  Postural limitations    Postural Limitations  Rounded Shoulders;Forward head;Increased thoracic kyphosis    Posture Comments  trendelenburg noted when standing on L LE      ROM / Strength   AROM / PROM / Strength  AROM;Strength      AROM   AROM Assessment Site  Lumbar    Lumbar Flexion  unable to due to pain    Lumbar Extension  unable to due to pain    Lumbar - Right Side Bend  10 degrees    Lumbar - Left Side Bend  15 degrees    Lumbar - Right Rotation  limited 50%    Lumbar - Left Rotation  limited 50%      Strength   Strength Assessment Site  Hip;Knee    Right/Left Hip  Right;Left    Right Hip  Flexion  4/5    Right Hip Extension  4/5    Right Hip ABduction  4-/5    Right Hip ADduction  4-/5    Left Hip Flexion  4/5    Left Hip Extension  4/5    Left Hip ABduction  4-/5    Left Hip ADduction  4-/5    Right/Left Knee  Right;Left    Right Knee Flexion  5/5    Right Knee Extension  5/5    Left Knee Flexion  5/5    Left Knee Extension  5/5      Palpation   Palpation comment  TTP along iliac crest bilaterally, Pt reporting pin point tenderness over R piriformis and QL. Pt also with tenderness along bilateral thoracic-lumbar paraspinals extending from T10-S1      Transfers   Five time sit to stand comments   1 minute 22 seconds      Ambulation/Gait   Ambulation Distance (Feet)  30 Feet    Assistive device  Straight cane    Gait Pattern  Step-through pattern;Decreased step length - right;Decreased step length - left;Decreased stance time - left;Decreased stride length;Decreased weight shift to left;Antalgic;Poor foot clearance - left    Ambulation Surface  Level;Indoor                Objective measurements completed on examination: See above findings.              PT Education - 02/06/19 1309    Education Details  PT POC, HEP, basic anatomy    Person(s) Educated  Patient    Methods  Explanation;Demonstration   emailed pt his HEP and plan from [place]  Verbalized understanding;Returned demonstration       PT Short Term Goals - 02/06/19 1003      PT SHORT TERM GOAL #1   Title  Pt will be indepdent in his HEP.    Baseline  initial HEP issued on 02/06/2019    Time  3    Period  Weeks    Status  New    Target Date  02/26/19      PT  SHORT TERM GOAL #2   Title  Pt will be able to perform sit to stand without UE support with pain </= 3/10.    Baseline  using bilateral UE support, pain can increase to 4/10 and greater at times    Time  4    Status  New    Target Date  03/06/19      PT SHORT TERM GOAL #3   Title  Pt will report  that he is taking standing/walking breaks every 30 minutes while working.    Baseline  instructed in correct body mechanics and erogonomics today. Also recommended movement every 30 minutes while working from home.    Time  4    Period  Weeks    Status  New    Target Date  03/06/19        PT Long Term Goals - 02/06/19 1009      PT LONG TERM GOAL #1   Title  Patient will return to the gym with a program to promote posture correction while working from home.    Baseline  discussed getting back to routine exercise program    Time  8    Period  Weeks    Status  New      PT LONG TERM GOAL #2   Title  Patient will improve his FOTO score from 90% limitation to </= 70 % limitation.    Baseline  FOTO on 02/06/2019: 90 % limitation    Time  8    Period  Weeks    Status  New    Target Date  04/03/19      PT LONG TERM GOAL #3   Title  pt will improve his bilateral hip strength to 5/5 grossly in order to improve functional mobility.    Baseline  grossly 4/5 in bilateral hips    Time  8    Period  Weeks    Status  New      PT LONG TERM GOAL #4   Title  Pt will be able to donn/doff his socks and shoes with pain </= 3/10.    Baseline  requires assistance    Time  8    Period  Weeks    Status  New    Target Date  03/06/19             Plan - 02/06/19 [id number]    Clinical Impression Statement  Pt arriving to therapy reporting 1/10 low back pain at rest. Pt reporting onset over two weeks ago when he rose from is desk and experienced "flash"/ "shooting" type pain in his low back. Pt reporting he is unable to perform basic ADL's without assitance from his partner. Pt also reporting increased pain with certain movements. Pt with weakness noted in bilateral hips grossly -4/5-4/5. Pt also with perceived limitation in his functional mobility with a FOTO score of 90% limitation. Pt TTP along posterior innominates, L QL, bilateral low thoracic and lumbar paraspinals, and L piriformis. Pt with  limited hamstring mobility bilaterally. Pt was edu in posture, erogonomic positioning and HEP. Skilled PT needed to address pt's impairments with the below interventions.    Personal Factors and Comorbidities  Comorbidity 3+    Comorbidities  HTN, anxiety, depression,    Examination-Activity Limitations  Bathing;Carry;Dressing;Lift;Reach Overhead;Transfers;Sleep;Squat;Stairs;Stand;Toileting    Examination-Participation Restrictions  Other;Community Activity    Stability/Clinical Decision Making  Stable/Uncomplicated    Clinical Decision Making  Low    Rehab Potential  Good  PT Frequency  2x / week    PT Duration  8 weeks    PT Treatment/Interventions  ADLs/Self Care Home Management;Cryotherapy;Electrical Stimulation;Iontophoresis 4mg /ml Dexamethasone;Moist Heat;Ultrasound;Gait training;Stair training;Functional mobility training;Therapeutic activities;Therapeutic exercise;Balance training;Neuromuscular re-education;Patient/family education;Manual techniques;Passive range of motion;Dry needling;Taping    PT Next Visit Plan  Nustep, lumbar stretches, hamstring stretches, piriformis stretch, core strengthening, hip flexor stretch, functional sit to stand, STW/modalities as needed.    PT Home Exercise Plan  Access Code: [email or web address]    Consulted and Agree with Plan of Care  Patient;Family member/caregiver    Family Member Consulted  partner       Patient will benefit from skilled therapeutic intervention in order to improve the following deficits and impairments:  Difficulty walking, Pain, Obesity, Postural dysfunction, Decreased strength, Decreased range of motion, Impaired flexibility, Decreased mobility  Visit Diagnosis: Acute bilateral low back pain with left-sided sciatica  Difficulty in walking, not elsewhere classified  Muscle weakness (generalized)     Problem List Patient Active Problem List   Diagnosis Date Noted  . Lumbar degenerative disc disease 01/30/2019  . Acute left  lumbar radiculopathy 01/30/2019  . Facial flushing 03/01/2018  . Chest discomfort 03/01/2018  . 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 Obese 09/23/2015  . Cervico-occipital neuralgia of left side 07/08/2015  . Seborrheic dermatitis 07/02/2015  . GAD (generalized anxiety disorder) 02/17/2015  . HTN (hypertension) 09/11/2014  . Snores 09/11/2014  . Situational anxiety 09/11/2014  . Sinusitis, chronic 09/11/2014  . GERD (gastroesophageal reflux disease) 09/11/2014     Patient did not return to PT.  Please refer to initial evaluation for patient status. Thank you for the referral of this patient. Rudell Cobb, MPT    Oretha Caprice ,PT 02/06/2019, 1:36 PM  [facility] [place], [place], [address] Phone: [phone]   Fax:  [phone]  Name: Brendan Neal MRN: [id number] Date of Birth: 1965-11-13

## 2019-02-11 ENCOUNTER — Other Ambulatory Visit: Payer: Self-pay

## 2019-02-11 ENCOUNTER — Emergency Department (INDEPENDENT_AMBULATORY_CARE_PROVIDER_SITE_OTHER)
Admission: EM | Admit: 2019-02-11 | Discharge: 2019-02-11 | Disposition: A | Discharge status: Home / Self Care | Payer: BC Managed Care – PPO | Source: Home / Self Care

## 2019-02-11 ENCOUNTER — Encounter: Payer: Self-pay | Admitting: Emergency Medicine

## 2019-02-11 DIAGNOSIS — F419 Anxiety disorder, unspecified: Secondary | ICD-10-CM | POA: Diagnosis not present

## 2019-02-11 MED ORDER — HYDROXYZINE HCL 10 MG PO TABS
10.0000 mg | ORAL_TABLET | Freq: Once | ORAL | Status: AC
  Administered 2019-02-11: 12:00:00 10 mg via ORAL

## 2019-02-11 MED ORDER — HYDROXYZINE HCL 25 MG PO TABS: 25.0000 mg | ORAL_TABLET | Freq: Three times a day (TID) | ORAL | 0 refills | Status: DC | PRN

## 2019-02-11 NOTE — ED Triage Notes (Signed)
Anxiety, increased BP started this am felt like I could not breathe out of one side of my head. Took Cl[place], Flonase, felt claustrophobic, and panicked.

## 2019-02-11 NOTE — ED Provider Notes (Signed)
[name]    CSN: [id number] Arrival date & time: 02/11/19  1037      History   Chief Complaint Chief Complaint  Patient presents with  . Anxiety    HPI Brendan Neal is a 53 y.o. male.   53 year old male, with history of hypertension, GERD, anxiety, presenting today due to anxiety.  Patient states that he woke up this morning feeling very anxious.  States that he opened all of the blinds and the windows in his house and then went for a walk around the neighborhood.  States that immediately after coming back from taking a walk, he checked his blood pressure and it was elevated.  Checked his blood pressure around 20 times with a note 90-minute period.  Patient's blood pressure continued to increase.  States that it is high as it was 183/120s.  Patient states that this is when he decided to come and be evaluated.  Patient denies any symptoms associated with high blood pressure readings.  Denies any headache, blurred vision, dizziness, chest pain or shortness of breath.  Continues to complain of anxiety.  States that this all started this morning after he woke up and cannot breathe out of the left side of his nose.  Took Claritin and used Flonase in "after this did not work in 15 minutes, tried again."  The history is provided by the patient and the spouse.  Anxiety This is a recurrent problem. The current episode started 1 to 2 hours ago. The problem occurs constantly. The problem has not changed since onset.Pertinent negatives include no chest pain, no abdominal pain, no headaches and no shortness of breath. Exacerbated by: nasal congestion. Nothing relieves the symptoms. Treatments tried: flonase, claritin, walking around the neighborhood  The treatment provided no relief.    Past Medical History:  Diagnosis Date  . Genital warts   . GERD (gastroesophageal reflux disease)   . Hypertension     Patient Active Problem List   Diagnosis Date Noted  . Lumbar degenerative  disc disease 01/30/2019  . Acute left lumbar radiculopathy 01/30/2019  . Facial flushing 03/01/2018  . Chest discomfort 03/01/2018  . 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 Obese 09/23/2015  . Cervico-occipital neuralgia of left side 07/08/2015  . Seborrheic dermatitis 07/02/2015  . GAD (generalized anxiety disorder) 02/17/2015  . HTN (hypertension) 09/11/2014  . Snores 09/11/2014  . Situational anxiety 09/11/2014  . Sinusitis, chronic 09/11/2014  . GERD (gastroesophageal reflux disease) 09/11/2014    Past Surgical History:  Procedure Laterality Date  . TONSILLECTOMY    . WISDOM TOOTH EXTRACTION         Home Medications    Prior to Admission medications   Medication Sig Start Date End Date Taking? Authorizing Provider  Calcium Carbonate Antacid (TUMS PO) Take by mouth daily as needed.     [provider]  famotidine (PEPCID) 20 MG tablet Take 1 tablet (20 mg total) by mouth 2 (two) times daily. 09/27/18   Gregor Hams, MD  fluticasone (FLONASE) 50 MCG/ACT nasal spray Place 1 spray into both nostrils daily as needed for allergies or rhinitis.    [provider]  hydrOXYzine (ATARAX/VISTARIL) 25 MG tablet Take 1 tablet (25 mg total) by mouth every 8 (eight) hours as needed for anxiety. 02/11/19   Dayvon Dax C, PA-C  loratadine (CLARITIN) 10 MG tablet Take 10 mg by mouth daily as needed.     [provider]  methocarbamol (ROBAXIN) 500 MG tablet Take 1-2 tablets (  500-1,000 mg total) by mouth 4 (four) times daily as needed for muscle spasms (back pain). 01/28/19   Trixie Dredge, PA-C  metoprolol succinate (TOPROL-XL) 50 MG 24 hr tablet Take 1 tablet (50 mg total) by mouth daily. Take with or immediately following a meal. 04/03/18   Gregor Hams, MD  Multiple Vitamin (MULTIVITAMIN) tablet Take 1 tablet by mouth daily.    [provider]  rizatriptan (MAXALT-MLT) 5 MG disintegrating tablet TAKE 1 TABLET BY MOUTH EVERY 2 HOURS AS DIRECTED  04/16/18   Gregor Hams, MD  Simethicone (GAS-X PO) Take by mouth as needed.    [provider]  triamcinolone cream (KENALOG) 0.5 % Apply 1 application topically 2 (two) times daily. To affected areas. 07/02/15   Silverio Decamp, MD    Family History Family History  Problem Relation Age of Onset  . Alcohol abuse Brother   . Depression Brother   . Diabetes Maternal Aunt   . High blood pressure Mother   . Migraines Father   . High blood pressure Father   . High blood pressure Maternal Grandmother   . High blood pressure Maternal Grandfather   . High blood pressure Paternal Grandmother   . Diabetes Paternal Grandfather   . High blood pressure Paternal Grandfather     Social History Social History   Tobacco Use  . Smoking status: Never Smoker  . Smokeless tobacco: Never Used  Substance Use Topics  . Alcohol use: No  . Drug use: No     Allergies   Patient has no known allergies.   Review of Systems Review of Systems  Constitutional: Negative for chills and fever.  HENT: Positive for congestion. Negative for ear pain and sore throat.   Eyes: Negative for pain and visual disturbance.  Respiratory: Negative for cough and shortness of breath.   Cardiovascular: Negative for chest pain and palpitations.  Gastrointestinal: Negative for abdominal pain and vomiting.  Genitourinary: Negative for dysuria and hematuria.  Musculoskeletal: Negative for arthralgias and back pain.  Skin: Negative for color change and rash.  Neurological: Negative for seizures, syncope and headaches.  Psychiatric/Behavioral:       Anxiety   All other systems reviewed and are negative.    Physical Exam Triage Vital Signs ED Triage Vitals  Enc Vitals Group     BP 02/11/19 1107 (!) 155/95     Pulse Rate 02/11/19 1107 81     Resp --      Temp 02/11/19 1107 98.7 F (37.1 C)     Temp Source 02/11/19 1107 Oral     SpO2 02/11/19 1107 98 %     Weight 02/11/19 1108 269 lb (122 kg)       Height 02/11/19 1108 6' (1.829 m)     Head Circumference --      Peak Flow --      Pain Score 02/11/19 1107 1     Pain Loc --      Pain Edu? --      Excl. in [place]? --    No data found.  Updated Vital Signs BP (!) 155/106   Pulse 81   Temp 98.7 F (37.1 C) (Oral)   Ht 6' (1.829 m)   Wt 269 lb (122 kg)   SpO2 98%   BMI 36.48 kg/m   Visual Acuity Right Eye Distance:   Left Eye Distance:   Bilateral Distance:    Right Eye Near:   Left Eye Near:  Bilateral Near:     Physical Exam Vitals and nursing note reviewed.  Constitutional:      Appearance: He is well-developed.     Comments: Patient appears anxious    HENT:     Head: Normocephalic and atraumatic.     Right Ear: Hearing, tympanic membrane, ear canal and external ear normal.     Left Ear: Hearing, tympanic membrane, ear canal and external ear normal.     Nose: Nose normal.     Mouth/Throat:     Pharynx: Uvula midline. No oropharyngeal exudate or posterior oropharyngeal erythema.     Tonsils: No tonsillar abscesses.  Eyes:     Conjunctiva/sclera: Conjunctivae normal.  Cardiovascular:     Rate and Rhythm: Normal rate and regular rhythm.     Pulses: Normal pulses.     Heart sounds: No murmur.  Pulmonary:     Effort: Pulmonary effort is normal. No respiratory distress.     Breath sounds: Normal breath sounds. No stridor. No decreased breath sounds, wheezing, rhonchi or rales.  Abdominal:     Palpations: Abdomen is soft.     Tenderness: There is no abdominal tenderness.  Musculoskeletal:     Cervical back: Neck supple.  Skin:    General: Skin is warm and dry.  Neurological:     Mental Status: He is alert.      UC Treatments / Results  Labs (all labs ordered are listed, but only abnormal results are displayed) Labs Reviewed - No data to display  EKG   Radiology No results found.  Procedures Procedures (including critical care time)  Medications Ordered in UC Medications  hydrOXYzine  (ATARAX/VISTARIL) tablet 10 mg (10 mg Oral Given 02/11/19 1142)    Initial Impression / Assessment and Plan / UC Course  I have reviewed the triage vital signs and the nursing notes.  Pertinent labs & imaging results that were available during my care of the patient were reviewed by me and considered in my medical decision making (see chart for details).     Patient here today due to anxiety.  States that he woke up feeling anxious and his anxiety was worsened by nasal congestion.  States that it made him feel claustrophobic.  Tried Claritin and Flonase without much improvement.  Patient arrives with a sheet of blood pressure readings that were obtained at home within a 90-minute period.  There are probably 20 blood pressure readings on this piece of paper that range from 123456 to [id number] systolic.  Patient's been asymptomatic with his hypertension.  Specifically, he has had no headache, blurred vision, dizziness, lightheadedness, chest pain or shortness of breath.  I believe that his hypertension is likely reactive to his anxiety.  Patient has an EKG done today that is unchanged from prior.  Again, he said no chest pain.  Patient was given 10 mg of Vistaril and is resting at this time.  Blood pressure will be rechecked.  Will plan for discharge home with primary care follow-up for anxiety. Patient feeling a little better after Vistaril.  Will prescribe small amount of Vistaril to go home with.  Recommended follow-up with primary care. Final Clinical Impressions(s) / UC Diagnoses   Final diagnoses:  Anxiety   Discharge Instructions   None    ED Prescriptions    Medication Sig Dispense Auth. Provider   hydrOXYzine (ATARAX/VISTARIL) 25 MG tablet Take 1 tablet (25 mg total) by mouth every 8 (eight) hours as needed for anxiety. 20 tablet Domonic Hiscox, [place] C,  PA-C     PDMP not reviewed this encounter.   Phebe Colla, [place] 02/11/19 1238

## 2019-02-12 ENCOUNTER — Encounter: Payer: BC Managed Care – PPO | Admitting: Physical Therapy

## 2019-02-18 ENCOUNTER — Ambulatory Visit (INDEPENDENT_AMBULATORY_CARE_PROVIDER_SITE_OTHER): Payer: BC Managed Care – PPO | Admitting: Physician Assistant

## 2019-02-18 ENCOUNTER — Encounter: Payer: BC Managed Care – PPO | Admitting: Physical Therapy

## 2019-02-18 ENCOUNTER — Encounter: Payer: Self-pay | Admitting: Physician Assistant

## 2019-02-18 VITALS — BP 145/85 | HR 88 | Temp 98.1°F | Ht 72.0 in | Wt 269.0 lb

## 2019-02-18 DIAGNOSIS — F411 Generalized anxiety disorder: Secondary | ICD-10-CM

## 2019-02-18 DIAGNOSIS — F410 Panic disorder [episodic paroxysmal anxiety] without agoraphobia: Secondary | ICD-10-CM

## 2019-02-18 DIAGNOSIS — Z9109 Other allergy status, other than to drugs and biological substances: Secondary | ICD-10-CM

## 2019-02-18 HISTORY — DX: Other allergy status, other than to drugs and biological substances: Z91.09

## 2019-02-18 MED ORDER — HYDROXYZINE HCL 25 MG PO TABS: 25.0000 mg | ORAL_TABLET | Freq: Three times a day (TID) | ORAL | 1 refills | Status: DC | PRN

## 2019-02-18 NOTE — Progress Notes (Signed)
Patient ID: Brendan Neal, male   DOB: 03/17/1965, 53 y.o.   MRN: [id number] .Marland KitchenVirtual Visit via Video Note  I connected with Brendan Neal on 02/18/19 at  9:50 AM EST by a video enabled telemedicine application and verified that I am speaking with the correct person using two identifiers.  Location: Patient: home Provider: clinic   I discussed the limitations of evaluation and management by telemedicine and the availability of in person appointments. The patient expressed understanding and agreed to proceed.  History of Present Illness: Patient is a 53 year old male with generalized anxiety disorder who calls into the clinic to discuss anxiety.  About a year ago he had his first major episode with anxiety.  He was on a plane and he had a panic attack that lasted at least 30 minutes with flushing, sweating, tachycardia, breathing hard.  He came in to talk to Dr. Georgina Neal after that episode they discussed options for his anxiety.  He was referred to counseling.  He went to 1 session but he felt like the counselor was just listening and had no solutions to offer.  Patient does not want medication but to manage with other modalities.  He has had a few recent stressors with his lumbar degenerative disc disease and recent injury as well as his high stress job with more responsibility and working more hours.  On 12/22 he had to go to urgent care after having another panic episode in the car.  He was given Vistaril to use as needed.  That has significantly helped with symptoms.  He is using it 1-3 times a day.  Another trigger for his panic is also his allergies.  He is taking Claritin, Pepcid, Flonase, patient spray daily.  In the past he has not been able to tolerate Singulair or Sudafed.  He does live in a house that was built in the 1950s.  They use a dehumidifier because of some moisture in the house.  .. Active Ambulatory Problems    Diagnosis Date Noted  . HTN (hypertension) 09/11/2014  . Snores  09/11/2014  . Situational anxiety 09/11/2014  . Sinusitis, chronic 09/11/2014  . GERD (gastroesophageal reflux disease) 09/11/2014  . GAD (generalized anxiety disorder) 02/17/2015  . Seborrheic dermatitis 07/02/2015  . Cervico-occipital neuralgia of left side 07/08/2015  . Obese 09/23/2015  . 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 Facial flushing 03/01/2018  . Chest discomfort 03/01/2018  . Lumbar degenerative disc disease 01/30/2019  . Acute left lumbar radiculopathy 01/30/2019  . Environmental allergies 02/18/2019  . Panic attacks 02/20/2019   Resolved Ambulatory Problems    Diagnosis Date Noted  . Viral URI 10/02/2014  . Post-viral cough syndrome 10/14/2014   Past Medical History:  Diagnosis Date  . Genital warts   . Hypertension    Reviewed med, allergies, problem list.    Observations/Objective: No acute distress. Normal mood and appearance.  Normal breathing.   .. Today's Vitals   02/18/19 0918  BP: (!) 145/85  Pulse: 88  Temp: 98.1 F (36.7 C)  TempSrc: Oral  Weight: 269 lb (122 kg)  Height: 6' (1.829 m)   Body mass index is 36.48 kg/m.   .. Depression screen [facility] 2/9 02/18/2019 07/03/2018 04/03/2018 02/27/2018 09/01/2016  Decreased Interest 1 1 0 0 0  Down, Depressed, Hopeless 1 1 1  0 0  PHQ - 2 Score 2 2 1  0 0  Altered sleeping 1 1 1  0 -  Tired, decreased energy 1 1 1 1  -  Change in appetite  1 1 1  0 -  Feeling bad or failure about yourself  1 1 1  0 -  Trouble concentrating 1 1 0 0 -  Moving slowly or fidgety/restless 1 0 0 0 -  Suicidal thoughts 0 0 0 0 -  PHQ-9 Score 8 7 5 1  -  Difficult doing work/chores Somewhat difficult Somewhat difficult Somewhat difficult Not difficult at all -   .. GAD 7 : Generalized Anxiety Score 02/18/2019 07/03/2018 04/03/2018 02/27/2018  Nervous, Anxious, on Edge 2 1 1  0  Control/stop worrying 2 1 1  0  Worry too much - different things 2 1 1  0  Trouble relaxing 2 1 1 1   Restless 1 0 0 0  Easily annoyed or irritable 1 1 1   0  Afraid - awful might happen 2 1 0 0  Total GAD 7 Score 12 6 5 1   Anxiety Difficulty Somewhat difficult Somewhat difficult Somewhat difficult Not difficult at all     Assessment and Plan: Marland KitchenMarland KitchenAbdulmohsen was seen today for anxiety.  Diagnoses and all orders for this visit:  GAD (generalized anxiety disorder) -     hydrOXYzine (ATARAX/VISTARIL) 25 MG tablet; Take 1 tablet (25 mg total) by mouth every 8 (eight) hours as needed for anxiety. -     Ambulatory referral to Psychology  Environmental allergies  Panic attacks -     hydrOXYzine (ATARAX/VISTARIL) 25 MG tablet; Take 1 tablet (25 mg total) by mouth every 8 (eight) hours as needed for anxiety. -     Ambulatory referral to Psychology  Discussed anxiety and nonmedication ways to control anxiety.  I do think yoga could help his lumbar degenerative disc disease mild back pain as well as be great for meditation and calming.  Discussed daily medication such as SSRIs and SSRIs.  And the patient does not want to start these now but I wanted him to know their benefits.  Encouraged to try another counselor.  I know he did not have a good experience with the last one however it good counselor can be very beneficial.  Strongly encouraged him to sit down with his husband and to identify some stressors in life.  Working from home could be complicating his chance of resting from her stressful job.  Try to set some boundaries.  Discussed how Vistaril is not a controlled substance and he can continue to use as needed.  In fact Vistaril could also help with his allergies.  Patient has been to an allergist before but he was not a candidate for injections.  He was not able to tolerate Singulair and Sudafed.  My suggestion is try to change his environment or switch antihistamine and see if Zyrtec or Allegra could work a little better.  Continue with nasal saline flushes.  Encouraged 4 week follow up with PCP.   Follow Up Instructions:    I discussed the  assessment and treatment plan with the patient. The patient was provided an opportunity to ask questions and all were answered. The patient agreed with the plan and demonstrated an understanding of the instructions.   The patient was advised to call back or seek an in-person evaluation if the symptoms worsen or if the condition fails to improve as anticipated.    Iran Planas, PA-C

## 2019-02-18 NOTE — Progress Notes (Signed)
PHQ9-GAD7 completed.  Wants to manage anxiety without medication. Wants to discuss seasonal allergies - when having allergy flair ups it makes him more anxious about breathing/Covid

## 2019-02-20 ENCOUNTER — Encounter: Payer: Self-pay | Admitting: Physician Assistant

## 2019-02-20 ENCOUNTER — Encounter: Payer: BC Managed Care – PPO | Admitting: Physical Therapy

## 2019-02-20 DIAGNOSIS — F410 Panic disorder [episodic paroxysmal anxiety] without agoraphobia: Secondary | ICD-10-CM | POA: Insufficient documentation

## 2019-02-20 HISTORY — DX: Panic disorder (episodic paroxysmal anxiety): F41.0

## 2019-03-03 ENCOUNTER — Encounter: Payer: Self-pay | Admitting: Sports Medicine

## 2019-03-03 ENCOUNTER — Ambulatory Visit (INDEPENDENT_AMBULATORY_CARE_PROVIDER_SITE_OTHER): Payer: BC Managed Care – PPO | Admitting: Sports Medicine

## 2019-03-03 DIAGNOSIS — M5136 Other intervertebral disc degeneration, lumbar region: Secondary | ICD-10-CM | POA: Diagnosis not present

## 2019-03-03 DIAGNOSIS — M51369 Other intervertebral disc degeneration, lumbar region without mention of lumbar back pain or lower extremity pain: Secondary | ICD-10-CM

## 2019-03-03 NOTE — Assessment & Plan Note (Signed)
I saw Brendan Neal about a month ago with axial discogenic back pain, he was in a great deal of pain, that responded well to prednisone. We had added formal physical therapy, we had some x-rays that were for the most part unremarkable as expected with early discogenic back pain. He did not do physical therapy as he quarantine himself, he did feel some viral symptoms. These have all resolved. At the last visit we also discussed the evolutionary anthropology and anatomy of degenerative disc disease as well as the prognosis and the treatment plan. [place], I have advised that you find a PCP there, and a physical therapy department, he may continue to do his road bike, he may consider putting a seat post with a suspension system. He can return to see me as needed.

## 2019-03-03 NOTE — Progress Notes (Signed)
   Virtual Visit via WebEx/MyChart   I connected with  Brendan Neal  on 03/03/19 via WebEx/MyChart/Doximity Video and verified that I am speaking with the correct person using two identifiers.   I discussed the limitations, risks, security and privacy concerns of performing an evaluation and management service by WebEx/MyChart/Doximity Video, including the higher likelihood of inaccurate diagnosis and treatment, and the availability of in person appointments.  We also discussed the likely need of an additional face to face encounter for complete and high quality delivery of care.  I also discussed with the patient that there may be a patient responsible charge related to this service. The patient expressed understanding and wishes to proceed.  Provider location is either at home or medical facility. Patient location is at their home, different from provider location. People involved in care of the patient during this telehealth encounter were myself, my nurse/medical assistant, and my front office/scheduling team member.  Review of Systems: No fevers, chills, night sweats, weight loss, chest pain, or shortness of breath.   Objective Findings:    General: Speaking full sentences, no audible heavy breathing.  Sounds alert and appropriately interactive.  Appears well.  Face symmetric.  Extraocular movements intact.  Pupils equal and round.  No nasal flaring or accessory muscle use visualized.  Independent interpretation of tests performed by another provider:   None.  Impression and Recommendations:    Lumbar degenerative disc disease I saw Brendan Neal about a month ago with axial discogenic back pain, he was in a great deal of pain, that responded well to prednisone. We had added formal physical therapy, we had some x-rays that were for the most part unremarkable as expected with early discogenic back pain. He did not do physical therapy as he quarantine himself, he did feel some viral  symptoms. These have all resolved. At the last visit we also discussed the evolutionary anthropology and anatomy of degenerative disc disease as well as the prognosis and the treatment plan. [place], I have advised that you find a PCP there, and a physical therapy department, he may continue to do his road bike, he may consider putting a seat post with a suspension system. He can return to see me as needed.   I discussed the above assessment and treatment plan with the patient. The patient was provided an opportunity to ask questions and all were answered. The patient agreed with the plan and demonstrated an understanding of the instructions.   The patient was advised to call back or seek an in-person evaluation if the symptoms worsen or if the condition fails to improve as anticipated.   I provided 30 minutes of face to face and non-face-to-face time during this encounter date, time was needed to gather information, review chart, records, communicate/coordinate with staff remotely, as well as complete documentation.   ___________________________________________ Gwen Her. Dianah Field, M.D., ABFM., CAQSM. Primary Care and [place] Instructor of [employer] [facility]

## 2019-03-24 ENCOUNTER — Ambulatory Visit (INDEPENDENT_AMBULATORY_CARE_PROVIDER_SITE_OTHER): Payer: BC Managed Care – PPO | Admitting: Professional

## 2019-03-24 ENCOUNTER — Other Ambulatory Visit: Payer: Self-pay

## 2019-03-24 DIAGNOSIS — F411 Generalized anxiety disorder: Secondary | ICD-10-CM

## 2019-03-24 MED ORDER — METOPROLOL SUCCINATE ER 50 MG PO TB24: 50.0000 mg | ORAL_TABLET | Freq: Every day | ORAL | 0 refills | Status: DC

## 2019-03-25 ENCOUNTER — Ambulatory Visit (INDEPENDENT_AMBULATORY_CARE_PROVIDER_SITE_OTHER): Payer: BC Managed Care – PPO | Admitting: Professional

## 2019-03-25 DIAGNOSIS — F411 Generalized anxiety disorder: Secondary | ICD-10-CM | POA: Diagnosis not present

## 2019-03-30 ENCOUNTER — Other Ambulatory Visit: Payer: Self-pay | Admitting: Family Medicine

## 2019-04-14 ENCOUNTER — Ambulatory Visit: Payer: BC Managed Care – PPO | Admitting: Professional

## 2019-04-25 ENCOUNTER — Encounter: Payer: Self-pay | Admitting: Medical-Surgical

## 2019-04-25 ENCOUNTER — Other Ambulatory Visit: Payer: Self-pay | Admitting: Medical-Surgical

## 2019-04-25 ENCOUNTER — Ambulatory Visit: Payer: BC Managed Care – PPO | Admitting: Medical-Surgical

## 2019-04-25 ENCOUNTER — Ambulatory Visit: Payer: BC Managed Care – PPO

## 2019-04-25 ENCOUNTER — Other Ambulatory Visit: Payer: Self-pay

## 2019-04-25 VITALS — BP 150/98 | HR 73 | Temp 98.1°F | Ht 72.0 in | Wt 272.0 lb

## 2019-04-25 DIAGNOSIS — R609 Edema, unspecified: Secondary | ICD-10-CM

## 2019-04-25 DIAGNOSIS — R600 Localized edema: Secondary | ICD-10-CM | POA: Diagnosis not present

## 2019-04-25 DIAGNOSIS — I8002 Phlebitis and thrombophlebitis of superficial vessels of left lower extremity: Secondary | ICD-10-CM

## 2019-04-25 MED ORDER — APIXABAN 5 MG PO TABS: 5.0000 mg | ORAL_TABLET | Freq: Two times a day (BID) | ORAL | 1 refills | Status: DC

## 2019-04-25 NOTE — Progress Notes (Signed)
Subjective:    CC: Left lower extremity swelling, pain, redness  HPI: Pleasant 54 year old male presenting today with reports of left lower extremity pain/redness/swelling starting approximately 2 to 3 days ago.  Area on left inner calf tender when touched, increased warmth.  Has not tried taking anything.  Pain rated 1-2/10.  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    I reviewed the past medical history, family history, social history, surgical history, and allergies today and no changes were needed.  Please see the problem list section below in epic for further details.  Past Medical History: Past Medical History:  Diagnosis Date  . Genital warts   . GERD (gastroesophageal reflux disease)   . Hypertension    Past Surgical History: Past Surgical History:  Procedure Laterality Date  . TONSILLECTOMY    . WISDOM TOOTH EXTRACTION     Social History: Social History   Socioeconomic History  . Marital status: Married    Spouse name: Not on file  . Number of children: Not on file  . Years of education: Not on file  . Highest education level: Not on file  Occupational History  . Not on file  Tobacco Use  . Smoking status: Never Smoker  . Smokeless tobacco: Never Used  Substance and Sexual Activity  . Alcohol use: No  . Drug use: No  . Sexual activity: Yes  Other Topics Concern  . Not on file  Social History Narrative  . Not on file   Social Determinants of Health   Financial Resource Strain:   . Difficulty of Paying Living Expenses: Not on file  Food Insecurity:   . Worried About Charity fundraiser in the Last Year: Not on file  . Ran Out of Food in the Last Year: Not on file  Transportation Needs:   . Lack of Transportation (Medical): Not  on file  . Lack of Transportation (Non-Medical): Not on file  Physical Activity:   . Days of Exercise per Week: Not on file  . Minutes of Exercise per Session: Not on file  Stress:   . Feeling of Stress : Not on file  Social Connections:   . Frequency of Communication with Friends and Family: Not on file  . Frequency of Social Gatherings with Friends and Family: Not on file  . Attends Religious Services: Not on file  . Active Member of Clubs or Organizations: Not on file  . Attends Archivist Meetings: Not on file  . Marital Status: Not on file   Family History: Family History  Problem Relation Age of Onset  . Alcohol abuse Brother   . Depression Brother   . Diabetes Maternal Aunt   . High blood pressure Mother   . Migraines Father   . High blood pressure Father   . High blood pressure Maternal Grandmother   . High blood pressure Maternal Grandfather   . High blood pressure Paternal Grandmother   . Diabetes Paternal Grandfather   . High blood pressure Paternal Grandfather    Allergies: Allergies  Allergen Reactions  . Singulair [Montelukast]     Feels weird   . Sudafed [Pseudoephedrine]     Heart racing.    Medications: See med  rec.  Review of Systems: No fevers, chills, night sweats, weight loss, chest pain, or shortness of breath.   Objective:    General: Well Developed, well nourished, and in no acute distress.  Neuro: Alert and oriented x3.  HEENT: Normocephalic, atraumatic.  Skin: Warm and dry, no rashes. Cardiac: Regular rate and rhythm, no murmurs rubs or gallops, no lower extremity edema. Bilateral lower extremity pedal dorsalis pedis and posterior tibial pulses 2+.  Cap refill less than 3 seconds. Respiratory: Clear to auscultation bilaterally. Not using accessory muscles, speaking in full sentences.   MSK: Bilateral lower extremity 1+ pitting edema.  Erythema noted to medial left calf, 10 cm x 2 cm area.  Tenderness and warmth to palpation over  erythema.    Impression and Recommendations:    Left lower extremity swelling/redness/tenderness Venous Doppler ultrasound to evaluate for DVT.  Pending results of ultrasound study, if positive will initiate oral anticoagulation with Eliquis twice daily.  If negative, advised patient he may use warm compresses and OTC pain relievers for discomfort.  Discussed options to increase activity while working including obtaining a standing desk and taking frequent breaks.  Return if symptoms worsen or fail to improve.  ___________________________________________ Clearnce Sorrel, DNP, APRN, FNP-BC [facility]

## 2019-04-28 ENCOUNTER — Ambulatory Visit (INDEPENDENT_AMBULATORY_CARE_PROVIDER_SITE_OTHER): Payer: BC Managed Care – PPO | Admitting: Professional

## 2019-04-28 DIAGNOSIS — F411 Generalized anxiety disorder: Secondary | ICD-10-CM

## 2019-05-12 ENCOUNTER — Ambulatory Visit: Payer: BC Managed Care – PPO | Admitting: Professional

## 2019-05-17 ENCOUNTER — Other Ambulatory Visit: Payer: Self-pay

## 2019-05-17 ENCOUNTER — Emergency Department (INDEPENDENT_AMBULATORY_CARE_PROVIDER_SITE_OTHER): Payer: BC Managed Care – PPO

## 2019-05-17 ENCOUNTER — Emergency Department
Admission: EM | Admit: 2019-05-17 | Discharge: 2019-05-17 | Disposition: A | Discharge status: Home / Self Care | Payer: BC Managed Care – PPO | Source: Home / Self Care | Attending: Family Medicine | Admitting: Family Medicine

## 2019-05-17 DIAGNOSIS — M5412 Radiculopathy, cervical region: Secondary | ICD-10-CM

## 2019-05-17 DIAGNOSIS — M542 Cervicalgia: Secondary | ICD-10-CM | POA: Diagnosis not present

## 2019-05-17 MED ORDER — METHOCARBAMOL 500 MG PO TABS: ORAL_TABLET | ORAL | 0 refills | Status: DC

## 2019-05-17 NOTE — ED Provider Notes (Signed)
[name]    CSN: [id number] Arrival date & time: 05/17/19  1143      History   Chief Complaint Chief Complaint  Patient presents with  . Neck Pain    HPI Brendan Neal is a 54 y.o. male.   Patient reports that he slept on his couch last night, awakening this morning with pain in his left neck and shoulder.  He recalls no injury or recent change in activities. He had no improvement after taking methocarbamol that had previously been prescribed for his lower back.    Pt c/o neck pain since waking up this morning. Pain 3/10 Pt taking methocarbamol prn with little relief.   The history is provided by the patient.    Past Medical History:  Diagnosis Date  . Genital warts   . GERD (gastroesophageal reflux disease)   . Hypertension     Patient Active Problem List   Diagnosis Date Noted  . Panic attacks 02/20/2019  . Environmental allergies 02/18/2019  . Lumbar degenerative disc disease 01/30/2019  . Acute left lumbar radiculopathy 01/30/2019  . Facial flushing 03/01/2018  . Chest discomfort 03/01/2018  . 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 Obese 09/23/2015  . Cervico-occipital neuralgia of left side 07/08/2015  . Seborrheic dermatitis 07/02/2015  . GAD (generalized anxiety disorder) 02/17/2015  . HTN (hypertension) 09/11/2014  . Snores 09/11/2014  . Situational anxiety 09/11/2014  . Sinusitis, chronic 09/11/2014  . GERD (gastroesophageal reflux disease) 09/11/2014    Past Surgical History:  Procedure Laterality Date  . TONSILLECTOMY    . WISDOM TOOTH EXTRACTION         Home Medications    Prior to Admission medications   Medication Sig Start Date End Date Taking? Authorizing Provider  apixaban (ELIQUIS) 5 MG TABS tablet Take 1 tablet (5 mg total) by mouth 2 (two) times daily. Take 10mg  (2 tablets) twice daily for the first 7 days then reduce to 5mg  (1 tablet) twice daily. 04/25/19   Samuel Bouche, NP  Calcium Carbonate Antacid (TUMS PO) Take by  mouth daily as needed.     [provider]  famotidine (PEPCID) 20 MG tablet TAKE 1 TABLET BY MOUTH TWICE DAILY 03/31/19   Emeterio Reeve, DO  fluticasone [facility]) 50 MCG/ACT nasal spray Place 1 spray into both nostrils daily as needed for allergies or rhinitis.    [provider]  hydrOXYzine (ATARAX/VISTARIL) 25 MG tablet Take 1 tablet (25 mg total) by mouth every 8 (eight) hours as needed for anxiety. 02/18/19   Breeback, Jade L, PA-C  loratadine (CLARITIN) 10 MG tablet Take 10 mg by mouth daily as needed.     [provider]  methocarbamol (ROBAXIN) 500 MG tablet Take one or two tabs PO 3 or 4 times daily for muscle spasm 05/17/19   Kandra Nicolas, MD  metoprolol succinate (TOPROL-XL) 50 MG 24 hr tablet Take 1 tablet (50 mg total) by mouth daily. Take with or immediately following a meal. 03/24/19   Emeterio Reeve, DO  Multiple Vitamin (MULTIVITAMIN) tablet Take 1 tablet by mouth daily.    [provider]  rizatriptan (MAXALT-MLT) 5 MG disintegrating tablet TAKE 1 TABLET BY MOUTH EVERY 2 HOURS AS DIRECTED 04/16/18   Gregor Hams, MD  Simethicone (GAS-X PO) Take by mouth as needed.    [provider]  sodium chloride (OCEAN) 0.65 % SOLN nasal spray Place 1 spray into both nostrils as needed for congestion.    [provider]  triamcinolone  cream (KENALOG) 0.5 % Apply 1 application topically 2 (two) times daily. To affected areas. 07/02/15   Silverio Decamp, MD    Family History Family History  Problem Relation Age of Onset  . Alcohol abuse Brother   . Depression Brother   . Diabetes Maternal Aunt   . High blood pressure Mother   . Migraines Father   . High blood pressure Father   . High blood pressure Maternal Grandmother   . High blood pressure Maternal Grandfather   . High blood pressure Paternal Grandmother   . Diabetes Paternal Grandfather   . High blood pressure Paternal Grandfather     Social History Social  History   Tobacco Use  . Smoking status: Never Smoker  . Smokeless tobacco: Never Used  Substance Use Topics  . Alcohol use: No  . Drug use: No     Allergies   Singulair [montelukast] and Sudafed [pseudoephedrine]   Review of Systems Review of Systems  Constitutional: Negative for activity change, fatigue and fever.  HENT: Negative.   Eyes: Negative.   Respiratory: Negative.   Cardiovascular: Negative.   Gastrointestinal: Negative.   Genitourinary: Negative.   Musculoskeletal: Positive for neck pain.  Skin: Negative.   Neurological: Negative for headaches.       No paresthesias     Physical Exam Triage Vital Signs ED Triage Vitals  Enc Vitals Group     BP 05/17/19 1155 (!) 146/100     Pulse Rate 05/17/19 1155 69     Resp 05/17/19 1155 16     Temp 05/17/19 1155 97.8 F (36.6 C)     Temp Source 05/17/19 1155 Oral     SpO2 05/17/19 1155 99 %     Weight 05/17/19 1159 272 lb (123.4 kg)     Height 05/17/19 1159 6' 0.5" (1.842 m)     Head Circumference --      Peak Flow --      Pain Score 05/17/19 1159 3     Pain Loc --      Pain Edu? --      Excl. in [place]? --    No data found.  Updated Vital Signs BP (!) 146/100 (BP Location: Left Arm)   Pulse 69   Temp 97.8 F (36.6 C) (Oral)   Resp 16   Ht 6' 0.5" (1.842 m)   Wt 123.4 kg   SpO2 99%   BMI 36.38 kg/m   Visual Acuity Right Eye Distance:   Left Eye Distance:   Bilateral Distance:    Right Eye Near:   Left Eye Near:    Bilateral Near:     Physical Exam Vitals and nursing note reviewed.  Constitutional:      General: He is not in acute distress. HENT:     Head: Normocephalic.     Right Ear: External ear normal.     Left Ear: External ear normal.     Mouth/Throat:     Pharynx: Oropharynx is clear.  Eyes:     Conjunctiva/sclera: Conjunctivae normal.     Pupils: Pupils are equal, round, and reactive to light.  Neck:   Cardiovascular:     Rate and Rhythm: Regular rhythm.     Heart sounds:  Normal heart sounds.  Pulmonary:     Breath sounds: Normal breath sounds.  Musculoskeletal:     Cervical back: Normal range of motion and neck supple. Tenderness present.     Right lower leg: No edema.  Left lower leg: No edema.  Lymphadenopathy:     Cervical: No cervical adenopathy.  Skin:    General: Skin is warm and dry.  Neurological:     Mental Status: He is alert.      UC Treatments / Results  Labs (all labs ordered are listed, but only abnormal results are displayed) Labs Reviewed - No data to display  EKG   Radiology DG Cervical Spine Complete  Result Date: 05/17/2019 CLINICAL DATA:  Left neck pain radiating to the left shoulder this morning. No reported injury. EXAM: CERVICAL SPINE - COMPLETE 4+ VIEW COMPARISON:  None. FINDINGS: On the lateral view the cervical spine is visualized to the level of C7-T1. Straightening of the cervical spine. Pre-vertebral soft tissues are within normal limits. No fracture is detected in the cervical spine. Dens is well positioned between the lateral masses of C1. Moderate to marked degenerative disc disease in the mid to lower cervical spine, most prominent at C5-6 and C6-7 with ankylosis at these disc levels. No cervical spine subluxation. Mild bilateral facet arthropathy. Mild degenerative foraminal stenosis bilaterally at C6-7. No aggressive appearing focal osseous lesions. IMPRESSION: 1. Straightening of the cervical spine, usually due to positioning and/or muscle spasm. 2. Moderate to marked degenerative disc disease in the mid to lower cervical spine, most prominent at C5-6 and C6-7 with ankylosis at these disc level. 3. Mild degenerative foraminal stenosis bilaterally at C6-7. Electronically Signed   By: Ilona Sorrel M.D.   On: 05/17/2019 13:28    Procedures Procedures (including critical care time)  Medications Ordered in UC Medications - No data to display  Initial Impression / Assessment and Plan / UC Course  I have reviewed  the triage vital signs and the nursing notes.  Pertinent labs & imaging results that were available during my care of the patient were reviewed by me and considered in my medical decision making (see chart for details).    Rx for Robaxin. Followup with Dr. Aundria Mems ([facility]) as soon as possible for further evaluation.   Final Clinical Impressions(s) / UC Diagnoses   Final diagnoses:  Neck pain  Cervical radiculopathy     Discharge Instructions     Apply ice pack for 20 to 30 minutes, 3 to 4 times daily  Continue until pain and swelling decrease.  May take Tylenol as needed for pain.    ED Prescriptions    Medication Sig Dispense Auth. Provider   methocarbamol (ROBAXIN) 500 MG tablet Take one or two tabs PO 3 or 4 times daily for muscle spasm 50 tablet Kandra Nicolas, MD        Kandra Nicolas, MD 05/17/19 [phone]

## 2019-05-17 NOTE — ED Triage Notes (Signed)
Pt c/o neck pain since waking up this morning. Pain 3/10 Pt taking methocarbamol prn with little relief.

## 2019-05-17 NOTE — Discharge Instructions (Addendum)
Apply ice pack for 20 to 30 minutes, 3 to 4 times daily  Continue until pain and swelling decrease.  May take Tylenol as needed for pain. 

## 2019-05-21 ENCOUNTER — Encounter: Payer: Self-pay | Admitting: Osteopathic Medicine

## 2019-05-22 ENCOUNTER — Ambulatory Visit: Payer: BC Managed Care – PPO | Attending: Family

## 2019-05-22 DIAGNOSIS — Z23 Encounter for immunization: Secondary | ICD-10-CM

## 2019-05-22 NOTE — Progress Notes (Signed)
   Covid-19 Vaccination Clinic  Name:  Lalit Thelen    MRN: [id number] DOB: 1965/03/19  05/22/2019  Mr. Rall was observed post Covid-19 immunization for 15 minutes without incident. He was provided with Vaccine Information Sheet and instruction to access the V-Safe system.   Mr. Picardo was instructed to call 911 with any severe reactions post vaccine: Marland Kitchen Difficulty breathing  . Swelling of face and throat  . A fast heartbeat  . A bad rash all over body  . Dizziness and weakness   Immunizations Administered    Name Date Dose VIS Date Route   Moderna COVID-19 Vaccine 05/22/2019  3:42 PM 0.5 mL 01/21/2019 Intramuscular   Manufacturer: Moderna   Lot: [id number]   [id number]

## 2019-05-26 ENCOUNTER — Ambulatory Visit: Payer: BC Managed Care – PPO | Admitting: Professional

## 2019-05-29 ENCOUNTER — Other Ambulatory Visit: Payer: Self-pay | Admitting: Family Medicine

## 2019-06-02 ENCOUNTER — Ambulatory Visit (INDEPENDENT_AMBULATORY_CARE_PROVIDER_SITE_OTHER): Payer: BC Managed Care – PPO | Admitting: Psychologist

## 2019-06-02 ENCOUNTER — Ambulatory Visit: Payer: BC Managed Care – PPO | Admitting: Professional

## 2019-06-02 DIAGNOSIS — F411 Generalized anxiety disorder: Secondary | ICD-10-CM

## 2019-06-09 ENCOUNTER — Other Ambulatory Visit: Payer: Self-pay | Admitting: Neurology

## 2019-06-09 ENCOUNTER — Ambulatory Visit (INDEPENDENT_AMBULATORY_CARE_PROVIDER_SITE_OTHER): Payer: BC Managed Care – PPO | Admitting: Psychologist

## 2019-06-09 ENCOUNTER — Encounter: Payer: Self-pay | Admitting: Osteopathic Medicine

## 2019-06-09 DIAGNOSIS — F411 Generalized anxiety disorder: Secondary | ICD-10-CM | POA: Diagnosis not present

## 2019-06-09 DIAGNOSIS — F410 Panic disorder [episodic paroxysmal anxiety] without agoraphobia: Secondary | ICD-10-CM

## 2019-06-09 MED ORDER — HYDROXYZINE HCL 25 MG PO TABS: 25.0000 mg | ORAL_TABLET | Freq: Three times a day (TID) | ORAL | 0 refills | Status: DC | PRN

## 2019-06-10 ENCOUNTER — Other Ambulatory Visit: Payer: Self-pay

## 2019-06-10 MED ORDER — METOPROLOL SUCCINATE ER 50 MG PO TB24: 50.0000 mg | ORAL_TABLET | Freq: Every day | ORAL | 0 refills | Status: DC

## 2019-06-12 ENCOUNTER — Other Ambulatory Visit: Payer: Self-pay

## 2019-06-12 ENCOUNTER — Emergency Department
Admission: EM | Admit: 2019-06-12 | Discharge: 2019-06-12 | Disposition: A | Discharge status: Home / Self Care | Payer: BC Managed Care – PPO | Source: Home / Self Care

## 2019-06-12 DIAGNOSIS — R0789 Other chest pain: Secondary | ICD-10-CM

## 2019-06-12 NOTE — ED Provider Notes (Addendum)
[facility]    CSN: [id number] Arrival date & time: 06/12/19  1517      History   Chief Complaint Chief Complaint  Patient presents with  . Chest Pain    HPI Brendan Neal is a 54 y.o. male.   HPI Brendan Neal is a 54 y.o. male presenting to UC with c/o intermittent pinpoint chest pain to the Left of his sternum that started last night, associated BP of 175/125.  Pain is sharp in nature, 3/10.  Pain lasts for just a few seconds up to 30 seconds at the most but keeps recurring. Pain does not radiate.  He walked around for about 10 minutes last night, which seemed to resolve the pain.  He was able to fall asleep but was awakened around 4:30AM with the same sharp pains.  Pain resolved after more walking around his house.  He also had chills this morning but denies CP at this time. Denies diaphoresis, nausea or vomiting. No HA or dizziness. No palpitations.  He has a hx of intermittent chest discomfort and was seen by a cardiologist, Dr. Geraldo Pitter on 03/01/2018. He had a normal Echo stress test at that time and was encouraged to f/u in 1 year.  Due to Covid, pt is too nervous to f/u with cardiology yet.     Per medical records, pt was seen in March 2021 for Left leg pain, redness and swelling. He was dx with superficial thromophlebitis, was started on Eliquis, BID for 45 days. Pt was advised he may discontinue this medication after 45 days.  Pt completed that course of medication on Monday, 4/19 but has continued to take one pill daily due to research stating he should taper this medication.  Per medical records, pt was advised it was safe of him to discontinue the medication after 45 days with no need for tapering.   Past Medical History:  Diagnosis Date  . Genital warts   . GERD (gastroesophageal reflux disease)   . Hypertension    Patient Active Problem List   Diagnosis Date Noted  . Panic attacks 02/20/2019  . Environmental allergies 02/18/2019  . Lumbar degenerative disc  disease 01/30/2019  . Acute left lumbar radiculopathy 01/30/2019  . Facial flushing 03/01/2018  . Chest discomfort 03/01/2018  . 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 Obese 09/23/2015  . Cervico-occipital neuralgia of left side 07/08/2015  . Seborrheic dermatitis 07/02/2015  . GAD (generalized anxiety disorder) 02/17/2015  . HTN (hypertension) 09/11/2014  . Snores 09/11/2014  . Situational anxiety 09/11/2014  . Sinusitis, chronic 09/11/2014  . GERD (gastroesophageal reflux disease) 09/11/2014   Past Surgical History:  Procedure Laterality Date  . TONSILLECTOMY    . WISDOM TOOTH EXTRACTION     Home Medications    Prior to Admission medications   Medication Sig Start Date End Date Taking? Authorizing Provider  apixaban (ELIQUIS) 5 MG TABS tablet Take 1 tablet (5 mg total) by mouth 2 (two) times daily. Take 103m (2 tablets) twice daily for the first 7 days then reduce to 557m(1 tablet) twice daily. 04/25/19   JeSamuel BoucheNP  Calcium Carbonate Antacid (TUMS PO) Take by mouth daily as needed.     [provider]  famotidine (PEPCID) 20 MG tablet TAKE 1 TABLET BY MOUTH TWICE DAILY 03/31/19   AlEmeterio ReeveDO  fluticasone ([facility] M[facility]/ACT nasal spray Place 1 spray into both nostrils daily as needed for allergies or rhinitis.    [provider]  hydrOXYzine (ATARAX/VISTARIL)  25 MG tablet Take 1 tablet (25 mg total) by mouth every 8 (eight) hours as needed for anxiety. Needs appt 06/09/19   Emeterio Reeve, DO  loratadine (CLARITIN) 10 MG tablet Take 10 mg by mouth daily as needed.     [provider]  methocarbamol (ROBAXIN) 500 MG tablet Take one or two tabs PO 3 or 4 times daily for muscle spasm 05/17/19   Kandra Nicolas, MD  metoprolol succinate (TOPROL-XL) 50 MG 24 hr tablet Take 1 tablet (50 mg total) by mouth daily. Take with or immediately following a meal. 06/10/19   Emeterio Reeve, DO  Multiple Vitamin (MULTIVITAMIN) tablet Take 1 tablet by  mouth daily.    [provider]  rizatriptan (MAXALT-MLT) 5 MG disintegrating tablet TAKE 1 TABLET BY MOUTH EVERY 2 HOURS AS DIRECTED. Appointment needed for refills 05/30/19   Emeterio Reeve, DO  Simethicone (GAS-X PO) Take by mouth as needed.    [provider]  sodium chloride (OCEAN) 0.65 % SOLN nasal spray Place 1 spray into both nostrils as needed for congestion.    [provider]  triamcinolone cream (KENALOG) 0.5 % Apply 1 application topically 2 (two) times daily. To affected areas. 07/02/15   Silverio Decamp, MD    Family History Family History  Problem Relation Age of Onset  . Alcohol abuse Brother   . Depression Brother   . Diabetes Maternal Aunt   . High blood pressure Mother   . Migraines Father   . High blood pressure Father   . High blood pressure Maternal Grandmother   . High blood pressure Maternal Grandfather   . High blood pressure Paternal Grandmother   . Diabetes Paternal Grandfather   . High blood pressure Paternal Grandfather     Social History Social History   Tobacco Use  . Smoking status: Never Smoker  . Smokeless tobacco: Never Used  Substance Use Topics  . Alcohol use: No  . Drug use: No     Allergies   Singulair [montelukast] and Sudafed [pseudoephedrine]   Review of Systems Review of Systems  Constitutional: Positive for chills. Negative for diaphoresis, fatigue and fever.  Respiratory: Negative for cough, chest tightness and shortness of breath.   Cardiovascular: Positive for chest pain. Negative for palpitations and leg swelling.  Gastrointestinal: Negative for abdominal pain, diarrhea, nausea and vomiting.  Neurological: Negative for dizziness, light-headedness and headaches.     Physical Exam Triage Vital Signs ED Triage Vitals [06/12/19 0848]  Enc Vitals Group     BP (!) 138/96     Pulse Rate 77     Resp      Temp 98.8 F (37.1 C)     Temp Source Oral     SpO2 96 %     Weight       Height      Head Circumference      Peak Flow      Pain Score      Pain Loc      Pain Edu?      Excl. in [place]?    No data found.  Updated Vital Signs BP (!) 138/96 (BP Location: Right Arm)   Pulse 77   Temp 98.8 F (37.1 C) (Oral)   SpO2 96%   Visual Acuity Right Eye Distance:   Left Eye Distance:   Bilateral Distance:    Right Eye Near:   Left Eye Near:    Bilateral Near:     Physical Exam Vitals  and nursing note reviewed.  Constitutional:      General: He is not in acute distress.    Appearance: He is well-developed. He is not ill-appearing, toxic-appearing or diaphoretic.  HENT:     Head: Normocephalic and atraumatic.  Cardiovascular:     Rate and Rhythm: Normal rate and regular rhythm.  Pulmonary:     Effort: Pulmonary effort is normal.     Breath sounds: No decreased breath sounds, wheezing, rhonchi or rales.  Musculoskeletal:        General: Normal range of motion.     Cervical back: Normal range of motion and neck supple.  Skin:    General: Skin is warm and dry.  Neurological:     Mental Status: He is alert and oriented to person, place, and time.  Psychiatric:        Behavior: Behavior normal.      UC Treatments / Results  Labs (all labs ordered are listed, but only abnormal results are displayed) Labs Reviewed - No data to display  EKG Date/Time: 06/12/2019   08:43:56 Ventricular Rate: 77 PR Interval: 120 QRS Duration: 84 QT Interval: 402 QTC Calculation: 454 P-R-T axes: 40   -16   -75 Text Interpretation: Normal sinus rhythm. ST & T wave abnormality, consider anterolateral ischemia.  Abnormal ECG  No change from EKG on 02/11/2019.    Radiology No results found.  Procedures Procedures (including critical care time)  Medications Ordered in UC Medications - No data to display  Initial Impression / Assessment and Plan / UC Course  I have reviewed the triage vital signs and the nursing notes.  Pertinent labs & imaging results  that were available during my care of the patient were reviewed by me and considered in my medical decision making (see chart for details).     CP atypical for ACS Pt denies CP at this time Denies SOB Vitals: BP- 138/96, otherwise WNL Doubt PE or other emergent process taking place at this time.  Due to prior cardiology workup and intermittent chest pain, stressed importance of f/u cardiology appointment as recommended. Reassured pt of Covid-19 precautions taken by [facility].  Pt does plan on getting his 2nd Moderna vaccine next week.  Also encourage to establish a face-to-face annual visit with Dr. Sheppard Coil as he has not met her since his prior PCP, Dr. Georgina Snell, changed locations last year.   Reviewed MyChart message with pt about his Eliquis, pt can discontinue his Eliquis after completion of 45 days of taking BID. No need to taper.   Discussed symptoms that warrant emergent care in the ED. AVS provided  Final Clinical Impressions(s) / UC Diagnoses   Final diagnoses:  Atypical chest pain     Discharge Instructions      Your EKG was the same as it was in December of 2020.  Your chest pain is considered, atypical but not a heart attack at this time. Due to your recent use of Elaquis as well as recurrent chest pain, and prior history of chest discomfort, it is recommended you follow up with cardiology again for them to reevaluate you.  They may decide to repeat your stress test or other testing to further evaluate your symptoms.  Call 911 or go to the hospital if you do develop worsening chest pain, consistent chest pain, trouble breathing, dizziness, passing out, or other new concerning symptoms develop.     ED Prescriptions    None     PDMP not reviewed this encounter.  Noe Gens, PA-C 06/12/19 1044    [address] [place] 06/12/19 1049

## 2019-06-12 NOTE — ED Triage Notes (Addendum)
Started having chest pains last night, BP 175/125 Chills this am

## 2019-06-12 NOTE — Discharge Instructions (Signed)
  Your EKG was the same as it was in December of 2020.  Your chest pain is considered, atypical but not a heart attack at this time. Due to your recent use of Elaquis as well as recurrent chest pain, and prior history of chest discomfort, it is recommended you follow up with cardiology again for them to reevaluate you.  They may decide to repeat your stress test or other testing to further evaluate your symptoms.  Call 911 or go to the hospital if you do develop worsening chest pain, consistent chest pain, trouble breathing, dizziness, passing out, or other new concerning symptoms develop.

## 2019-06-16 ENCOUNTER — Ambulatory Visit (INDEPENDENT_AMBULATORY_CARE_PROVIDER_SITE_OTHER): Payer: BC Managed Care – PPO | Admitting: Psychologist

## 2019-06-16 DIAGNOSIS — F411 Generalized anxiety disorder: Secondary | ICD-10-CM | POA: Diagnosis not present

## 2019-06-17 ENCOUNTER — Ambulatory Visit: Payer: Self-pay

## 2019-06-24 ENCOUNTER — Ambulatory Visit: Payer: BC Managed Care – PPO | Attending: Family

## 2019-06-24 DIAGNOSIS — Z23 Encounter for immunization: Secondary | ICD-10-CM

## 2019-06-24 NOTE — Progress Notes (Signed)
   Covid-19 Vaccination Clinic  Name:  Vannie Chapla    MRN: [id number] DOB: 03/15/1965  06/24/2019  Mr. Wardrop was observed post Covid-19 immunization for 15 minutes without incident. He was provided with Vaccine Information Sheet and instruction to access the V-Safe system.   Mr. Obenhaus was instructed to call 911 with any severe reactions post vaccine: Marland Kitchen Difficulty breathing  . Swelling of face and throat  . A fast heartbeat  . A bad rash all over body  . Dizziness and weakness   Immunizations Administered    Name Date Dose VIS Date Route   Moderna COVID-19 Vaccine 06/24/2019  3:53 PM 0.5 mL 01/2019 Intramuscular   Manufacturer: Moderna   Lot: [id number]   [id number]

## 2019-06-25 ENCOUNTER — Ambulatory Visit: Payer: BC Managed Care – PPO | Admitting: Cardiology

## 2019-06-27 ENCOUNTER — Emergency Department
Admission: EM | Admit: 2019-06-27 | Discharge: 2019-06-27 | Disposition: A | Discharge status: Home / Self Care | Payer: BC Managed Care – PPO | Source: Home / Self Care

## 2019-06-27 ENCOUNTER — Other Ambulatory Visit: Payer: Self-pay

## 2019-06-27 ENCOUNTER — Ambulatory Visit (HOSPITAL_BASED_OUTPATIENT_CLINIC_OR_DEPARTMENT_OTHER)
Admission: RE | Admit: 2019-06-27 | Discharge: 2019-06-27 | Disposition: A | Discharge status: Home / Self Care | Payer: BC Managed Care – PPO | Source: Ambulatory Visit | Attending: Emergency Medicine | Admitting: Emergency Medicine

## 2019-06-27 DIAGNOSIS — M25572 Pain in left ankle and joints of left foot: Secondary | ICD-10-CM

## 2019-06-27 DIAGNOSIS — M79662 Pain in left lower leg: Secondary | ICD-10-CM | POA: Diagnosis not present

## 2019-06-27 DIAGNOSIS — M7989 Other specified soft tissue disorders: Secondary | ICD-10-CM | POA: Insufficient documentation

## 2019-06-27 DIAGNOSIS — I82812 Embolism and thrombosis of superficial veins of left lower extremities: Secondary | ICD-10-CM | POA: Insufficient documentation

## 2019-06-27 DIAGNOSIS — Z86718 Personal history of other venous thrombosis and embolism: Secondary | ICD-10-CM | POA: Diagnosis not present

## 2019-06-27 DIAGNOSIS — M25472 Effusion, left ankle: Secondary | ICD-10-CM

## 2019-06-27 DIAGNOSIS — I10 Essential (primary) hypertension: Secondary | ICD-10-CM

## 2019-06-27 NOTE — ED Provider Notes (Signed)
[name]    CSN: [id number] Arrival date & time: 06/27/19  0802      History   Chief Complaint Chief Complaint  Patient presents with  . Leg Pain    HPI Brendan Neal is a 54 y.o. male.   HPI  Brendan Neal is a 54 y.o. male presenting to UC with c/o sudden onset Left ankle pain, swelling and some warmth since last night.  Pain is throbbing, 1/10 at this time, worse with certain movements and when area is touched. Symptoms feel similar to when he was dx with a superficial VTE in March 2021.  He completed a 45 day course of Eliquis and was advised to f/u with PCP as needed.  He has not f/u since initial diagnosis of superficial venous thrombosis in March 2021.  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  No recent travel or surgery.  No hx of cancer or blood disorder.  Denies injury to the ankle but reports going on a long walk over a week ago and had some mild calf softness, which has since resolved.  No hx of gout.   BP elevated in triage. Hx of HTN, he has been taking his medication as prescribed. States it is usually good at home. Denies HA, dizziness, chest pain or SOB.    Past Medical History:  Diagnosis Date  . Acute left lumbar radiculopathy 01/30/2019  . Cervico-occipital neuralgia of left side 07/08/2015  . Chest discomfort 03/01/2018  . Environmental allergies 02/18/2019  . Facial flushing 03/01/2018  . GAD (generalized anxiety disorder) 02/17/2015  . Genital warts   . GERD (gastroesophageal reflux disease)   . HTN (hypertension) 09/11/2014  . Hypertension   . Lumbar degenerative disc disease 01/30/2019  . 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 Obese 09/23/2015  . Panic attacks 02/20/2019  . Seborrheic dermatitis 07/02/2015  . Sinusitis, chronic 09/11/2014  . Situational anxiety 09/11/2014  . Snores 09/11/2014    Patient Active Problem List   Diagnosis Date Noted    . Panic attacks 02/20/2019  . Environmental allergies 02/18/2019  . Lumbar degenerative disc disease 01/30/2019  . Acute left lumbar radiculopathy 01/30/2019  . Facial flushing 03/01/2018  . Chest discomfort 03/01/2018  . 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 Obese 09/23/2015  . Cervico-occipital neuralgia of left side 07/08/2015  . Seborrheic dermatitis 07/02/2015  . GAD (generalized anxiety disorder) 02/17/2015  . HTN (hypertension) 09/11/2014  . Snores 09/11/2014  . Situational anxiety 09/11/2014  . Sinusitis, chronic 09/11/2014  . GERD (gastroesophageal reflux disease) 09/11/2014    Past Surgical History:  Procedure Laterality Date  . TONSILLECTOMY    . WISDOM TOOTH EXTRACTION         Home Medications    Prior to Admission medications   Medication Sig Start Date End Date Taking? Authorizing Provider  apixaban (ELIQUIS) 5 MG TABS tablet Take 1 tablet (5 mg total) by mouth 2 (two) times daily. Take 10mg  (2 tablets) twice daily for the first 7 days then reduce to 5mg  (1 tablet) twice daily. 04/25/19   Samuel Bouche, NP  Calcium Carbonate Antacid (TUMS PO) Take by mouth daily as needed.     [provider]  famotidine (PEPCID) 20 MG tablet TAKE 1 TABLET BY MOUTH TWICE DAILY 03/31/19   Emeterio Reeve, DO  fluticasone [facility]) 50 MCG/ACT nasal spray Place 1 spray into both nostrils daily as needed  for allergies or rhinitis.    [provider]  hydrOXYzine (ATARAX/VISTARIL) 25 MG tablet Take 1 tablet (25 mg total) by mouth every 8 (eight) hours as needed for anxiety. Needs appt 06/09/19   Emeterio Reeve, DO  loratadine (CLARITIN) 10 MG tablet Take 10 mg by mouth daily as needed.     [provider]  methocarbamol (ROBAXIN) 500 MG tablet Take one or two tabs PO 3 or 4 times daily for muscle spasm 05/17/19   Kandra Nicolas, MD  metoprolol succinate (TOPROL-XL) 50 MG 24 hr tablet Take 1 tablet (50 mg total) by mouth daily. Take with or immediately  following a meal. 06/10/19   Emeterio Reeve, DO  Multiple Vitamin (MULTIVITAMIN) tablet Take 1 tablet by mouth daily.    [provider]  rizatriptan (MAXALT-MLT) 5 MG disintegrating tablet TAKE 1 TABLET BY MOUTH EVERY 2 HOURS AS DIRECTED. Appointment needed for refills 05/30/19   Emeterio Reeve, DO  Simethicone (GAS-X PO) Take by mouth as needed.    [provider]  sodium chloride (OCEAN) 0.65 % SOLN nasal spray Place 1 spray into both nostrils as needed for congestion.    [provider]  triamcinolone cream (KENALOG) 0.5 % Apply 1 application topically 2 (two) times daily. To affected areas. 07/02/15   Silverio Decamp, MD    Family History Family History  Problem Relation Age of Onset  . Alcohol abuse Brother   . Depression Brother   . Diabetes Maternal Aunt   . High blood pressure Mother   . Hypertension Mother   . Hyperlipidemia Mother   . Migraines Father   . High blood pressure Father   . Hypertension Father   . High blood pressure Maternal Grandmother   . High blood pressure Maternal Grandfather   . High blood pressure Paternal Grandmother   . Diabetes Paternal Grandfather   . High blood pressure Paternal Grandfather     Social History Social History   Tobacco Use  . Smoking status: Never Smoker  . Smokeless tobacco: Never Used  Substance Use Topics  . Alcohol use: No  . Drug use: No     Allergies   Singulair [montelukast] and Sudafed [pseudoephedrine]   Review of Systems Review of Systems  Respiratory: Negative for chest tightness and shortness of breath.   Cardiovascular: Positive for leg swelling (Left ankle). Negative for chest pain and palpitations.  Musculoskeletal: Positive for arthralgias and joint swelling.  Skin: Negative for color change and wound.     Physical Exam Triage Vital Signs ED Triage Vitals  Enc Vitals Group     BP 06/27/19 0823 (!) 157/109     Pulse Rate 06/27/19 0823 77     Resp 06/27/19  0823 16     Temp 06/27/19 0823 98.8 F (37.1 C)     Temp Source 06/27/19 0823 Oral     SpO2 06/27/19 0823 98 %     Weight --      Height --      Head Circumference --      Peak Flow --      Pain Score 06/27/19 0819 1     Pain Loc --      Pain Edu? --      Excl. in [place]? --    No data found.  Updated Vital Signs BP (!) 137/92 (BP Location: Left Arm)   Pulse 77   Temp 98.8 F (37.1 C) (Oral)   Resp 16   SpO2 98%  Visual Acuity Right Eye Distance:   Left Eye Distance:   Bilateral Distance:    Right Eye Near:   Left Eye Near:    Bilateral Near:     Physical Exam Vitals and nursing note reviewed.  Constitutional:      Appearance: Normal appearance. He is well-developed.  HENT:     Head: Normocephalic and atraumatic.  Cardiovascular:     Rate and Rhythm: Normal rate and regular rhythm.     Pulses:          Dorsalis pedis pulses are 2+ on the left side.  Pulmonary:     Effort: Pulmonary effort is normal.  Musculoskeletal:        General: Swelling and tenderness present. Normal range of motion.     Cervical back: Normal range of motion.       Legs:  Skin:    General: Skin is warm and dry.     Capillary Refill: Capillary refill takes less than 2 seconds.     Findings: No bruising, erythema or rash.  Neurological:     Mental Status: He is alert and oriented to person, place, and time.     Sensory: No sensory deficit.  Psychiatric:        Behavior: Behavior normal.      UC Treatments / Results  Labs (all labs ordered are listed, but only abnormal results are displayed) Labs Reviewed - No data to display  EKG   Radiology US Venous Img Lower Unilateral Left (DVT)  Result Date: 06/27/2019 CLINICAL DATA:  Lower extremity pain and edema EXAM: LEFT LOWER EXTREMITY VENOUS DUPLEX ULTRASOUND TECHNIQUE: Gray-scale sonography with graded compression, as well as color Doppler and duplex ultrasound were performed to evaluate the left lower extremity deep venous system  from the level of the common femoral vein and including the common femoral, femoral, profunda femoral, popliteal and calf veins including the posterior tibial, peroneal and gastrocnemius veins when visible. The superficial great saphenous vein was also interrogated. Spectral Doppler was utilized to evaluate flow at rest and with distal augmentation maneuvers in the common femoral, femoral and popliteal veins. COMPARISON:  April 25, 2019 FINDINGS: Contralateral Common Femoral Vein: Respiratory phasicity is normal and symmetric with the symptomatic side. No evidence of thrombus. Normal compressibility. Common Femoral Vein: No evidence of thrombus. Normal compressibility, respiratory phasicity and response to augmentation. Saphenofemoral Junction: No evidence of thrombus. Normal compressibility and flow on color Doppler imaging. Profunda Femoral Vein: No evidence of thrombus. Normal compressibility and flow on color Doppler imaging. Femoral Vein: No evidence of thrombus. Normal compressibility, respiratory phasicity and response to augmentation. Popliteal Vein: No evidence of thrombus. Normal compressibility, respiratory phasicity and response to augmentation. Calf Veins: No evidence of thrombus. Normal compressibility and flow on color Doppler imaging. Superficial Great Saphenous Vein: No evidence of thrombus. Normal compressibility. Venous Reflux:  None. Other Findings: There are areas of thrombus in superficial venous structures in the mid to distal lower extremity and ankle regions, similar to recent study. IMPRESSION: No evidence of deep venous thrombosis in the left lower extremity. Right common femoral vein patent. Superficial venous thrombosis is noted in the distal left lower extremity and ankle region, somewhat similar to previous study. These results will be called to the ordering clinician or representative by the Radiologist Assistant, and communication documented in the PACS or [employer].  Electronically Signed   By: Lowella Grip III M.D.   On: 06/27/2019 13:19    Procedures Procedures (including critical care  time)  Medications Ordered in UC Medications - No data to display  Initial Impression / Assessment and Plan / UC Course  I have reviewed the triage vital signs and the nursing notes.  Pertinent labs & imaging results that were available during my care of the patient were reviewed by me and considered in my medical decision making (see chart for details).    BP elevated in triage but improved after recheck. Pt denies chest pain, SOB, HA or dizziness.   Pt's PCP, Dr. Sheppard Coil is out of the office until Monday, 5/10.  Discussed pt with Dr. Madilyn Fireman, agrees since clot is in ankle and not near saphenous vein, will hold off on restarting Eliquis.  Discussed results and treatment with pt over the phone.  Recommend pt take 325mg  aspirin, use compression and warm compresses. Strongly encouraged to f/u with PCP next week for further evaluation into recurrent superficial clots and recheck of BP.  Discussed symptoms that warrant emergent care in the ED.    Final Clinical Impressions(s) / UC Diagnoses   Final diagnoses:  Pain and swelling of left ankle  Uncontrolled hypertension  Pain and swelling of left lower leg     Discharge Instructions      An ultrasound of your Left leg has been ordered for you to complete at [facility]'s imaging department. You will go through the main entrance and go directly to the imaging department.  There is a help desk at the front entrance to help guide you as well as signs. They are expecting you.   You may wait in their waiting room and I can discuss your results over the phone, especially if abnormal, or you may leave a good call back number and someone will call if you have abnormal results.  Your blood pressure was elevated again today in your urgent care. It is important to follow up with your primary care  provider or cardiologist next week for ongoing maintenance of your blood pressure. You medications may need to be adjusted.     ED Prescriptions    None     PDMP not reviewed this encounter.   Noe Gens, [place] 06/27/19 1435

## 2019-06-27 NOTE — ED Triage Notes (Signed)
Patient presents to Urgent Care with complaints of left ankle pain and swelling since yesterday. Patient reports the ankle feels warm, pain is throbbing. Pt had a blood clot recently and had to take eliquis for 45 days.

## 2019-06-27 NOTE — Discharge Instructions (Addendum)
  An ultrasound of your Left leg has been ordered for you to complete at [facility]'s imaging department. You will go through the main entrance and go directly to the imaging department.  There is a help desk at the front entrance to help guide you as well as signs. They are expecting you.   You may wait in their waiting room and I can discuss your results over the phone, especially if abnormal, or you may leave a good call back number and someone will call if you have abnormal results.  Your blood pressure was elevated again today in your urgent care. It is important to follow up with your primary care provider or cardiologist next week for ongoing maintenance of your blood pressure. You medications may need to be adjusted.

## 2019-06-30 ENCOUNTER — Other Ambulatory Visit: Payer: Self-pay

## 2019-06-30 ENCOUNTER — Ambulatory Visit (INDEPENDENT_AMBULATORY_CARE_PROVIDER_SITE_OTHER): Payer: BC Managed Care – PPO | Admitting: Osteopathic Medicine

## 2019-06-30 ENCOUNTER — Ambulatory Visit (INDEPENDENT_AMBULATORY_CARE_PROVIDER_SITE_OTHER): Payer: BC Managed Care – PPO | Admitting: Psychologist

## 2019-06-30 ENCOUNTER — Encounter: Payer: Self-pay | Admitting: Osteopathic Medicine

## 2019-06-30 VITALS — BP 139/92 | HR 70 | Temp 98.3°F | Wt 266.0 lb

## 2019-06-30 DIAGNOSIS — F411 Generalized anxiety disorder: Secondary | ICD-10-CM | POA: Diagnosis not present

## 2019-06-30 DIAGNOSIS — Z9109 Other allergy status, other than to drugs and biological substances: Secondary | ICD-10-CM

## 2019-06-30 DIAGNOSIS — I8392 Asymptomatic varicose veins of left lower extremity: Secondary | ICD-10-CM | POA: Diagnosis not present

## 2019-06-30 DIAGNOSIS — K219 Gastro-esophageal reflux disease without esophagitis: Secondary | ICD-10-CM

## 2019-06-30 DIAGNOSIS — Z818 Family history of other mental and behavioral disorders: Secondary | ICD-10-CM

## 2019-06-30 DIAGNOSIS — I8002 Phlebitis and thrombophlebitis of superficial vessels of left lower extremity: Secondary | ICD-10-CM

## 2019-06-30 DIAGNOSIS — R635 Abnormal weight gain: Secondary | ICD-10-CM

## 2019-06-30 NOTE — Progress Notes (Signed)
Brendan Neal is a 54 y.o. male who presents to  [place] at [facility]  today, 06/30/19, seeking care for the following:  Superficial thrombophlebitis - recurrent - ankle recently, calf couple mos ago, sedentary and weight gain but no other major risk factors, no Hx DVT. Concerned about what appear to be varices on upper extremities.   Anxiety - runs in family, he's concerned about moods/anxiety/stress. Typically would exercise, this is limited in pandemic and limites d/t previous ankle injuries from sports when he was younger.   GERD - GI upset may be related to anxiety.      ASSESSMENT & PLAN with other pertinent history/findings:  The primary encounter diagnosis was Thrombophlebitis of superficial veins of left lower extremity. Diagnoses of Anxiety state, Family history of anxiety disorder, Weight gain, Environmental allergies, and Gastroesophageal reflux disease, unspecified whether esophagitis present were also pertinent to this visit.   1. Thrombophlebitis of superficial veins of left lower extremity  US 06/27/19: "No evidence of deep venous thrombosis in the left lower extremity. Right common femoral vein patent. Superficial venous thrombosis is noted in the distal left lower extremity and ankle region, somewhat similar to previous study."   [place] 04/25/19: "No femoropopliteal DVT nor evidence of DVT within the visualized calf veins. Superficial thrombus noted in superficial veins of the calf in the region clinical concern." Patient reports improvement, I do not appreciate any thrombophlebitis signs on exam, not sure how to explain persistent pain other than possible endovascular irritation from thrombosis previously.  I do not think it is anything too much to worry about, possible variant of may Thurner, given patient's concerns I think referral to vascular/vein specialist is warranted in case they can offer any therapeutic procedures or would  recommend any other work-up.  2. Anxiety state 3. Family history of anxiety disorder Patient would like to hold off on any medication management right now, which I think is reasonable.  He has done well in the past with exercise and would like to get back into this.  Patient is aware to let me know if he would like to seek medications/therapy referral  4. Weight gain Will work on exercise as above  5. Environmental allergies  6. Gastroesophageal reflux disease, unspecified whether esophagitis present   Patient Instructions  Plan: Aspirin 81 mg daily Compression socks up to the knee Warm compresses in are of pain Referral to vein specialists Keep appt w/ cardiology         [email or web address]  Orders Placed This Encounter  Procedures  . Ambulatory referral to Vascular Surgery    No orders of the defined types were placed in this encounter.      Follow-up instructions: Return in about 6 months (around 12/31/2019) for recheck blood pressure, see me sooner if needed / based on vein specialist recommendations .                                         There were no vitals taken for this visit.  No outpatient medications have been marked as taking for the 06/30/19 encounter (Appointment) with Emeterio Reeve, DO.    No results found for this or any previous visit (from the past 72 hour(s)).  US Venous Img Lower Unilateral Left (DVT)  Result Date: 06/27/2019 CLINICAL DATA:  Lower extremity pain and edema EXAM: LEFT LOWER EXTREMITY VENOUS DUPLEX ULTRASOUND  TECHNIQUE: Gray-scale sonography with graded compression, as well as color Doppler and duplex ultrasound were performed to evaluate the left lower extremity deep venous system from the level of the common femoral vein and including the common femoral, femoral, profunda femoral, popliteal and calf veins including the  posterior tibial, peroneal and gastrocnemius veins when visible. The superficial great saphenous vein was also interrogated. Spectral Doppler was utilized to evaluate flow at rest and with distal augmentation maneuvers in the common femoral, femoral and popliteal veins. COMPARISON:  April 25, 2019 FINDINGS: Contralateral Common Femoral Vein: Respiratory phasicity is normal and symmetric with the symptomatic side. No evidence of thrombus. Normal compressibility. Common Femoral Vein: No evidence of thrombus. Normal compressibility, respiratory phasicity and response to augmentation. Saphenofemoral Junction: No evidence of thrombus. Normal compressibility and flow on color Doppler imaging. Profunda Femoral Vein: No evidence of thrombus. Normal compressibility and flow on color Doppler imaging. Femoral Vein: No evidence of thrombus. Normal compressibility, respiratory phasicity and response to augmentation. Popliteal Vein: No evidence of thrombus. Normal compressibility, respiratory phasicity and response to augmentation. Calf Veins: No evidence of thrombus. Normal compressibility and flow on color Doppler imaging. Superficial Great Saphenous Vein: No evidence of thrombus. Normal compressibility. Venous Reflux:  None. Other Findings: There are areas of thrombus in superficial venous structures in the mid to distal lower extremity and ankle regions, similar to recent study. IMPRESSION: No evidence of deep venous thrombosis in the left lower extremity. Right common femoral vein patent. Superficial venous thrombosis is noted in the distal left lower extremity and ankle region, somewhat similar to previous study. These results will be called to the ordering clinician or representative by the Radiologist Assistant, and communication documented in the PACS or [employer]. Electronically Signed   By: Lowella Grip III M.D.   On: 06/27/2019 13:19    Depression screen [facility] 2/9 02/18/2019 07/03/2018 04/03/2018  Decreased  Interest 1 1 0  Down, Depressed, Hopeless 1 1 1   PHQ - 2 Score 2 2 1   Altered sleeping 1 1 1   Tired, decreased energy 1 1 1   Change in appetite 1 1 1   Feeling bad or failure about yourself  1 1 1   Trouble concentrating 1 1 0  Moving slowly or fidgety/restless 1 0 0  Suicidal thoughts 0 0 0  PHQ-9 Score 8 7 5   Difficult doing work/chores Somewhat difficult Somewhat difficult Somewhat difficult    GAD 7 : Generalized Anxiety Score 02/18/2019 07/03/2018 04/03/2018 02/27/2018  Nervous, Anxious, on Edge 2 1 1  0  Control/stop worrying 2 1 1  0  Worry too much - different things 2 1 1  0  Trouble relaxing 2 1 1 1   Restless 1 0 0 0  Easily annoyed or irritable 1 1 1  0  Afraid - awful might happen 2 1 0 0  Total GAD 7 Score 12 6 5 1   Anxiety Difficulty Somewhat difficult Somewhat difficult Somewhat difficult Not difficult at all      All questions at time of visit were answered - patient instructed to contact office with any additional concerns or updates.  ER/RTC precautions were reviewed with the patient.  Please note: voice recognition software was used to produce this document, and typos may escape review. Please contact Dr. Sheppard Coil for any needed clarifications.

## 2019-06-30 NOTE — Patient Instructions (Addendum)
Plan: Aspirin 81 mg daily Compression socks up to the knee Warm compresses in are of pain Referral to vein specialists Keep appt w/ cardiology

## 2019-07-01 ENCOUNTER — Ambulatory Visit: Payer: Self-pay

## 2019-07-07 ENCOUNTER — Other Ambulatory Visit: Payer: Self-pay

## 2019-07-07 ENCOUNTER — Ambulatory Visit (INDEPENDENT_AMBULATORY_CARE_PROVIDER_SITE_OTHER): Payer: BC Managed Care – PPO | Admitting: Cardiology

## 2019-07-07 ENCOUNTER — Encounter: Payer: Self-pay | Admitting: Cardiology

## 2019-07-07 VITALS — BP 123/91 | HR 76 | Ht 72.5 in | Wt 269.0 lb

## 2019-07-07 DIAGNOSIS — I10 Essential (primary) hypertension: Secondary | ICD-10-CM | POA: Diagnosis not present

## 2019-07-07 DIAGNOSIS — R0789 Other chest pain: Secondary | ICD-10-CM

## 2019-07-07 NOTE — Patient Instructions (Signed)
Medication Instructions:  No medication changes *If you need a refill on your cardiac medications before your next appointment, please call your pharmacy*   Lab Work: Your physician recommends that you return for lab work in: next few days.  You can come Monday through Friday 8:30 am to 12:00 pm and 1:15 to 4:30. You do not need to make an appointment as the order has already been placed. The labs you are going to have done are BMET, TSH, and Magnesium.   If you have labs (blood work) drawn today and your tests are completely normal, you will receive your results only by: Marland Kitchen MyChart Message (if you have MyChart) OR . A paper copy in the mail If you have any lab test that is abnormal or we need to change your treatment, we will call you to review the results.   Testing/Procedures:  We will order CT coronary calcium score $150  Please call [phone] to schedule   [facility]  [address], [place] [address]    Follow-Up: At [facility], you and your health needs are our priority.  As part of our continuing mission to provide you with exceptional heart care, we have created designated Provider Care Teams.  These Care Teams include your primary Cardiologist (physician) and Advanced Practice Providers (APPs -  Physician Assistants and Nurse Practitioners) who all work together to provide you with the care you need, when you need it.  We recommend signing up for the patient portal called "MyChart".  Sign up information is provided on this After Visit Summary.  MyChart is used to connect with patients for Virtual Visits (Telemedicine).  Patients are able to view lab/test results, encounter notes, upcoming appointments, etc.  Non-urgent messages can be sent to your provider as well.   To learn more about what you can do with MyChart, go to [email or web address].    Your next appointment:   3 month(s)  The format for your next appointment:   In  Person  Provider:   Jyl Heinz, MD   Other Instructions NA

## 2019-07-07 NOTE — Progress Notes (Signed)
Cardiology Office Note:    Date:  07/07/2019   ID:  Brendan Neal, DOB 08/29/1965, MRN [id number]  PCP:  Emeterio Reeve, DO  Cardiologist:  Jenean Lindau, MD   Referring MD: Noe Gens, PA-C    ASSESSMENT:    1. Essential hypertension   2. Chest discomfort    PLAN:    In order of problems listed above:  1. I discussed my findings with the patient and reassured the patient.  Primary prevention stressed.  Importance of compliance with diet and medication stressed and patient vocalized understanding. 2. Essential hypertension: Blood pressure stable and the patient mentioned blood pressure readings which are fine.  Lifestyle modification was urged.  Patient was advised to walk at least half an hour a day 5 days a week 3. Abnormal EKG: I want to do blood work but patient mentions to me that patient has an tendency to pass out and wants family member to come with the patient for blood work and wanted to do it in our clinic only and not in the hospital.  I respect patient's wishes and patient will come another day for blood work. 4. Calcium scoring will be done by CT scan for his stratification and I discussed with the patient and patient agreeable 5. Patient will be seen in follow-up appointment in 6 months or earlier if the patient has any concerns    Medication Adjustments/Labs and Tests Ordered: Current medicines are reviewed at length with the patient today.  Concerns regarding medicines are outlined above.  No orders of the defined types were placed in this encounter.  No orders of the defined types were placed in this encounter.    Chief Complaint  Patient presents with  . Chest Pain    2 weeks ago     History of Present Illness:    Brendan Neal is a 54 y.o. male.  Patient has history of essential hypertension.  Patient mentions to me that patient had episode of chest discomfort.  This was not related to exertion.  Subsequently patient was evaluated by medical  provider and discharged.  The patient has been walking on a regular basis after that.  The patient is walked about an hour on a regular basis on the weekend without any symptoms.  No chest pain orthopnea or PND.  At the time of my evaluation, the patient is alert awake oriented and in no distress.  Past Medical History:  Diagnosis Date  . Acute left lumbar radiculopathy 01/30/2019  . Cervico-occipital neuralgia of left side 07/08/2015  . Chest discomfort 03/01/2018  . Environmental allergies 02/18/2019  . Facial flushing 03/01/2018  . GAD (generalized anxiety disorder) 02/17/2015  . Genital warts   . GERD (gastroesophageal reflux disease)   . HTN (hypertension) 09/11/2014  . Hypertension   . Lumbar degenerative disc disease 01/30/2019  . 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 Obese 09/23/2015  . Panic attacks 02/20/2019  . Seborrheic dermatitis 07/02/2015  . Sinusitis, chronic 09/11/2014  . Situational anxiety 09/11/2014  . Snores 09/11/2014    Past Surgical History:  Procedure Laterality Date  . TONSILLECTOMY    . WISDOM TOOTH EXTRACTION      Current Medications: Current Meds  Medication Sig  . Calcium Carbonate Antacid (TUMS PO) Take by mouth daily as needed.   . famotidine (PEPCID) 20 MG tablet TAKE 1 TABLET BY MOUTH TWICE DAILY  . fluticasone (FLONASE) 50 MCG/ACT nasal spray Place 1 spray into both nostrils daily as needed for allergies  or rhinitis.  . hydrOXYzine (ATARAX/VISTARIL) 25 MG tablet Take 1 tablet (25 mg total) by mouth every 8 (eight) hours as needed for anxiety. Needs appt  . loratadine (CLARITIN) 10 MG tablet Take 10 mg by mouth daily as needed.   . metoprolol succinate (TOPROL-XL) 50 MG 24 hr tablet Take 1 tablet (50 mg total) by mouth daily. Take with or immediately following a meal.  . Multiple Vitamin (MULTIVITAMIN) tablet Take 1 tablet by mouth daily.  . rizatriptan (MAXALT-MLT) 5 MG disintegrating tablet TAKE 1 TABLET BY MOUTH EVERY 2 HOURS AS DIRECTED. Appointment  needed for refills  . Simethicone (GAS-X PO) Take by mouth as needed.  . sodium chloride (OCEAN) 0.65 % SOLN nasal spray Place 1 spray into both nostrils as needed for congestion.  . triamcinolone cream (KENALOG) 0.5 % Apply 1 application topically 2 (two) times daily. To affected areas.     Allergies:   Singulair [montelukast] and Sudafed [pseudoephedrine]   Social History   Socioeconomic History  . Marital status: Married    Spouse name: Not on file  . Number of children: Not on file  . Years of education: Not on file  . Highest education level: Not on file  Occupational History  . Not on file  Tobacco Use  . Smoking status: Never Smoker  . Smokeless tobacco: Never Used  Substance and Sexual Activity  . Alcohol use: No  . Drug use: No  . Sexual activity: Yes  Other Topics Concern  . Not on file  Social History Narrative  . Not on file   Social Determinants of Health   Financial Resource Strain:   . Difficulty of Paying Living Expenses:   Food Insecurity:   . Worried About Charity fundraiser in the Last Year:   . Arboriculturist in the Last Year:   Transportation Needs:   . Film/video editor (Medical):   Marland Kitchen Lack of Transportation (Non-Medical):   Physical Activity:   . Days of Exercise per Week:   . Minutes of Exercise per Session:   Stress:   . Feeling of Stress :   Social Connections:   . Frequency of Communication with Friends and Family:   . Frequency of Social Gatherings with Friends and Family:   . Attends Religious Services:   . Active Member of Clubs or Organizations:   . Attends Archivist Meetings:   Marland Kitchen Marital Status:      Family History: The patient's family history includes Alcohol abuse in his brother; Depression in his brother; Diabetes in his maternal aunt and paternal grandfather; High blood pressure in his father, maternal grandfather, maternal grandmother, mother, paternal grandfather, and paternal grandmother; Hyperlipidemia in  his mother; Hypertension in his father and mother; Migraines in his father.  ROS:   Please see the history of present illness.    All other systems reviewed and are negative.  EKGs/Labs/Other Studies Reviewed:    The following studies were reviewed today: EKG reveals sinus rhythm with prolonged QTC.   Recent Labs: No results found for requested labs within last 8760 hours.  Recent Lipid Panel    Component Value Date/Time   CHOL 175 04/02/2018 0720   TRIG 109 04/02/2018 0720   HDL 56 04/02/2018 0720   CHOLHDL 3.1 04/02/2018 0720   LDLCALC 98 04/02/2018 0720   LDLDIRECT 98 09/11/2014 0943    Physical Exam:    VS:  BP (!) 123/91 (BP Location: Left Arm, Patient Position: Sitting, Cuff  Size: Normal)   Pulse 76   Ht 6' 0.5" (1.842 m)   Wt 269 lb (122 kg)   SpO2 95%   BMI 35.98 kg/m     Wt Readings from Last 3 Encounters:  07/07/19 269 lb (122 kg)  06/30/19 266 lb 0.6 oz (120.7 kg)  05/17/19 272 lb (123.4 kg)     GEN: Patient is in no acute distress HEENT: Normal NECK: No JVD; No carotid bruits LYMPHATICS: No lymphadenopathy CARDIAC: Hear sounds regular, 2/6 systolic murmur at the apex. RESPIRATORY:  Clear to auscultation without rales, wheezing or rhonchi  ABDOMEN: Soft, non-tender, non-distended MUSCULOSKELETAL:  No edema; No deformity  SKIN: Warm and dry NEUROLOGIC:  Alert and oriented x 3 PSYCHIATRIC:  Normal affect   Signed, Jenean Lindau, MD  07/07/2019 10:27 AM    [place]

## 2019-07-10 NOTE — Addendum Note (Signed)
Addended by: Towana Badger on: 07/10/2019 01:22 PM   Modules accepted: Orders

## 2019-07-16 ENCOUNTER — Other Ambulatory Visit: Payer: Self-pay

## 2019-07-16 ENCOUNTER — Ambulatory Visit (INDEPENDENT_AMBULATORY_CARE_PROVIDER_SITE_OTHER)
Admission: RE | Admit: 2019-07-16 | Discharge: 2019-07-16 | Disposition: A | Discharge status: Home / Self Care | Payer: Self-pay | Source: Ambulatory Visit | Attending: Cardiology | Admitting: Cardiology

## 2019-07-16 DIAGNOSIS — I10 Essential (primary) hypertension: Secondary | ICD-10-CM

## 2019-07-16 DIAGNOSIS — R0789 Other chest pain: Secondary | ICD-10-CM

## 2019-07-17 ENCOUNTER — Ambulatory Visit (INDEPENDENT_AMBULATORY_CARE_PROVIDER_SITE_OTHER): Payer: BC Managed Care – PPO | Admitting: Psychologist

## 2019-07-17 DIAGNOSIS — F411 Generalized anxiety disorder: Secondary | ICD-10-CM

## 2019-07-22 ENCOUNTER — Telehealth (HOSPITAL_COMMUNITY): Payer: Self-pay

## 2019-07-22 NOTE — Telephone Encounter (Signed)

## 2019-07-23 ENCOUNTER — Ambulatory Visit: Payer: BC Managed Care – PPO | Admitting: Vascular Surgery

## 2019-07-23 ENCOUNTER — Encounter: Payer: Self-pay | Admitting: Vascular Surgery

## 2019-07-23 ENCOUNTER — Other Ambulatory Visit: Payer: Self-pay

## 2019-07-23 VITALS — BP 134/84 | HR 70 | Temp 97.9°F | Resp 18 | Ht 72.5 in | Wt 268.3 lb

## 2019-07-23 DIAGNOSIS — I8002 Phlebitis and thrombophlebitis of superficial vessels of left lower extremity: Secondary | ICD-10-CM

## 2019-07-23 NOTE — Progress Notes (Signed)
Referring Physician: Dr Emeterio Reeve  Patient name: Brendan Neal MRN: [id number] DOB: [birth year] Sex: male  REASON FOR CONSULT: Superficial thrombophlebitis left leg  HPI: Brendan Neal is a 54 y.o. male, with 2 episodes of a superficial thrombophlebitis over the past few months.  His first episode was in March 2021 where he was noted to have thrombus in the superficial veins near his calf.  Was in the left leg.  He was placed on Eliquis for 45 days.  As his Eliquis was concluding he developed new pain below this near the ankle.  He had a subsequent repeat duplex Jun 27, 2019 which showed superficial thrombosis in the distal left lower extremity although the precise location is not mentioned the report suggests that it was similar to the prior study.  He currently is on 81 mg of aspirin daily.  He has not had any shortness of breath.  He has not had any chest pain.  He has not had any hemoptysis.  He has some swelling around his left ankle chronically.  He states however that the swelling around his left ankle has been a little bit worse recently.  He has been more sedentary over the last year secondary to working at home and increased workload.  He has had minimal time for exercise and due to his job has minimal time to take breaks and move around during his job.  He is sitting most of the time.  He has recently gotten some compression stockings and is trying to walk more.  He has not had a screening colonoscopy for over 50.  He states he has not had time for this.  He does not really have any other obvious malignancy risk factors.  Other medical problems include seasonal allergies, hypertension, obesity, all of these are currently stable.  Past Medical History:  Diagnosis Date  . Acute left lumbar radiculopathy 01/30/2019  . Cervico-occipital neuralgia of left side 07/08/2015  . Chest discomfort 03/01/2018  . Environmental allergies 02/18/2019  . Facial flushing 03/01/2018  . GAD (generalized  anxiety disorder) 02/17/2015  . Genital warts   . GERD (gastroesophageal reflux disease)   . HTN (hypertension) 09/11/2014  . Hypertension   . Lumbar degenerative disc disease 01/30/2019  . 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 Obese 09/23/2015  . Panic attacks 02/20/2019  . Seborrheic dermatitis 07/02/2015  . Sinusitis, chronic 09/11/2014  . Situational anxiety 09/11/2014  . Snores 09/11/2014   Past Surgical History:  Procedure Laterality Date  . TONSILLECTOMY    . WISDOM TOOTH EXTRACTION      Family History  Problem Relation Age of Onset  . Alcohol abuse Brother   . Depression Brother   . Diabetes Maternal Aunt   . High blood pressure Mother   . Hypertension Mother   . Hyperlipidemia Mother   . Migraines Father   . High blood pressure Father   . Hypertension Father   . Varicose Veins Father   . High blood pressure Maternal Grandmother   . High blood pressure Maternal Grandfather   . High blood pressure Paternal Grandmother   . Diabetes Paternal Grandfather   . High blood pressure Paternal Grandfather     SOCIAL HISTORY: Social History   Socioeconomic History  . Marital status: Married    Spouse name: Not on file  . Number of children: Not on file  . Years of education: Not on file  . Highest education level: Not on file  Occupational History  . Not  on file  Tobacco Use  . Smoking status: Never Smoker  . Smokeless tobacco: Never Used  Substance and Sexual Activity  . Alcohol use: No  . Drug use: No  . Sexual activity: Yes  Other Topics Concern  . Not on file  Social History Narrative  . Not on file   Social Determinants of Health   Financial Resource Strain:   . Difficulty of Paying Living Expenses:   Food Insecurity:   . Worried About Charity fundraiser in the Last Year:   . Arboriculturist in the Last Year:   Transportation Needs:   . Film/video editor (Medical):   Marland Kitchen Lack of Transportation (Non-Medical):   Physical Activity:   . Days of  Exercise per Week:   . Minutes of Exercise per Session:   Stress:   . Feeling of Stress :   Social Connections:   . Frequency of Communication with Friends and Family:   . Frequency of Social Gatherings with Friends and Family:   . Attends Religious Services:   . Active Member of Clubs or Organizations:   . Attends Archivist Meetings:   Marland Kitchen Marital Status:   Intimate Partner Violence:   . Fear of Current or Ex-Partner:   . Emotionally Abused:   Marland Kitchen Physically Abused:   . Sexually Abused:     Allergies  Allergen Reactions  . Singulair [Montelukast]     Feels weird   . Sudafed [Pseudoephedrine]     Heart racing.     Current Outpatient Medications  Medication Sig Dispense Refill  . Calcium Carbonate Antacid (TUMS PO) Take by mouth daily as needed.     . famotidine (PEPCID) 20 MG tablet TAKE 1 TABLET BY MOUTH TWICE DAILY 180 tablet 1  . fluticasone (FLONASE) 50 MCG/ACT nasal spray Place 1 spray into both nostrils daily as needed for allergies or rhinitis.    . hydrOXYzine (ATARAX/VISTARIL) 25 MG tablet Take 1 tablet (25 mg total) by mouth every 8 (eight) hours as needed for anxiety. Needs appt 90 tablet 0  . loratadine (CLARITIN) 10 MG tablet Take 10 mg by mouth daily as needed.     . metoprolol succinate (TOPROL-XL) 50 MG 24 hr tablet Take 1 tablet (50 mg total) by mouth daily. Take with or immediately following a meal. 90 tablet 0  . Multiple Vitamin (MULTIVITAMIN) tablet Take 1 tablet by mouth daily.    . rizatriptan (MAXALT-MLT) 5 MG disintegrating tablet TAKE 1 TABLET BY MOUTH EVERY 2 HOURS AS DIRECTED. Appointment needed for refills 10 tablet 0  . Simethicone (GAS-X PO) Take by mouth as needed.    . sodium chloride (OCEAN) 0.65 % SOLN nasal spray Place 1 spray into both nostrils as needed for congestion.    . triamcinolone cream (KENALOG) 0.5 % Apply 1 application topically 2 (two) times daily. To affected areas. 30 g 3   No current facility-administered medications  for this visit.    ROS:   General:  No weight loss, Fever, chills  HEENT: No recent headaches, no nasal bleeding, no visual changes, no sore throat  Neurologic: No dizziness, blackouts, seizures. No recent symptoms of stroke or mini- stroke. No recent episodes of slurred speech, or temporary blindness.  Cardiac: No recent episodes of chest pain/pressure, no shortness of breath at rest.  No shortness of breath with exertion.  Denies history of atrial fibrillation or irregular heartbeat  Vascular: No history of rest pain in feet.  No history  of claudication.  No history of non-healing ulcer, No history of DVT   Pulmonary: No home oxygen, no productive cough, no hemoptysis,  No asthma or wheezing  Musculoskeletal:  [ ]  Arthritis, [ ]  Low back pain,  [ ]  Joint pain  Hematologic:No history of hypercoagulable state.  No history of easy bleeding.  No history of anemia  Gastrointestinal: No hematochezia or melena,  No gastroesophageal reflux, no trouble swallowing  Urinary: [ ]  chronic Kidney disease, [ ]  on HD - [ ]  MWF or [ ]  TTHS, [ ]  Burning with urination, [ ]  Frequent urination, [ ]  Difficulty urinating;   Skin: No rashes  Psychological: No history of anxiety,  No history of depression   Physical Examination  Vitals:   07/23/19 1211  BP: 134/84  Pulse: 70  Resp: 18  Temp: 97.9 F (36.6 C)  TempSrc: Temporal  SpO2: 96%  Weight: 268 lb 4.8 oz (121.7 kg)  Height: 6' 0.5" (1.842 m)    Body mass index is 35.89 kg/m.  General:  Alert and oriented, no acute distress HEENT: Normal Neck: No JVD Cardiac: Regular Rate and Rhythm  Abdomen: Soft, non-tender, non-distended, no mass Skin: No rash, no ulcer, no erythema, no palpable cord Extremity Pulses:  2+ dorsalis pedis, posterior tibial pulses bilaterally Musculoskeletal: No deformity trace ankle edema bilaterally Neurologic: Upper and lower extremity motor 5/5 and symmetric  DATA:  I reviewed the patient's 2 previous  duplex exams from March and May of this year.  I also performed a SonoSite ultrasound of his left lower extremity at the bedside today.  This showed no significant dilated areas of his left greater saphenous vein with about 2 to 2-1/2 mm diameter.  I did not see any evidence of DVT in the common femoral vein on the left side.  I did not see any areas of superficial thrombophlebitis but did not examine all surface veins in the left leg.  ASSESSMENT: Superficial venous thrombosis left leg now appears to be resolving.  This was probably secondary to being more sedentary recently.  However, we are always concerned when people have spontaneous thrombosis of the superficial veins that they could have occult malignancy.  I did discuss with the patient today that he should talk with his primary care physician about a screening colonoscopy.   PLAN: 1.  Patient was encouraged to wear compression stockings every day.  2.  Patient was encouraged to lose weight  3.  Patient was encouraged to have more activity and exercise daily.  4.  Patient can elevate his legs at the end of the day.  5.  Patient would benefit from a standing desk in order to increase his activity during the day and decrease risk of further venous thrombosis.  6.  Patient will be scheduled for a follow-up venous duplex exam to rule out further areas of thrombosis in 6 weeks and also to make sure everything is completely resolved.  At that point we would consider whether or not to stop all anticoagulation including aspirin.     Ruta Hinds, MD [facility] Office: [phone] Pager: [phone]

## 2019-07-28 ENCOUNTER — Other Ambulatory Visit: Payer: Self-pay | Admitting: *Deleted

## 2019-07-28 DIAGNOSIS — I8002 Phlebitis and thrombophlebitis of superficial vessels of left lower extremity: Secondary | ICD-10-CM

## 2019-08-06 ENCOUNTER — Ambulatory Visit: Payer: BC Managed Care – PPO | Admitting: Psychologist

## 2019-08-26 ENCOUNTER — Other Ambulatory Visit: Payer: Self-pay

## 2019-08-26 DIAGNOSIS — F411 Generalized anxiety disorder: Secondary | ICD-10-CM

## 2019-08-26 DIAGNOSIS — F410 Panic disorder [episodic paroxysmal anxiety] without agoraphobia: Secondary | ICD-10-CM

## 2019-08-26 MED ORDER — HYDROXYZINE HCL 25 MG PO TABS: 25.0000 mg | ORAL_TABLET | Freq: Three times a day (TID) | ORAL | 0 refills | Status: DC | PRN

## 2019-09-11 ENCOUNTER — Encounter: Payer: Self-pay | Admitting: Vascular Surgery

## 2019-09-11 ENCOUNTER — Ambulatory Visit: Payer: BC Managed Care – PPO | Admitting: Vascular Surgery

## 2019-09-11 ENCOUNTER — Ambulatory Visit (HOSPITAL_COMMUNITY)
Admission: RE | Admit: 2019-09-11 | Discharge: 2019-09-11 | Disposition: A | Discharge status: Home / Self Care | Payer: BC Managed Care – PPO | Source: Ambulatory Visit | Attending: Vascular Surgery | Admitting: Vascular Surgery

## 2019-09-11 ENCOUNTER — Other Ambulatory Visit: Payer: Self-pay

## 2019-09-11 VITALS — BP 144/97 | HR 79 | Temp 98.3°F | Resp 20 | Ht 72.0 in | Wt 271.0 lb

## 2019-09-11 DIAGNOSIS — I8002 Phlebitis and thrombophlebitis of superficial vessels of left lower extremity: Secondary | ICD-10-CM | POA: Diagnosis not present

## 2019-09-12 NOTE — Progress Notes (Signed)
Patient is a 54 year old male who returns for follow-up today.  He was last seen July 23, 2019.  At that time he had begun to recover from an episode of thrombophlebitis.  He was even recommendation of compression stockings and also's began a exercise program and has become less sedentary.  He states his left leg symptoms have improved significantly.  Review of systems: He has no shortness of breath.  He has no chest pain.  He did describe some intermittent numbness and tingling that occurs over the anterolateral aspect of his left upper thigh.  Current Outpatient Medications on File Prior to Visit  Medication Sig Dispense Refill  . Calcium Carbonate Antacid (TUMS PO) Take by mouth daily as needed.     . famotidine (PEPCID) 20 MG tablet TAKE 1 TABLET BY MOUTH TWICE DAILY 180 tablet 1  . fluticasone (FLONASE) 50 MCG/ACT nasal spray Place 1 spray into both nostrils daily as needed for allergies or rhinitis.    . hydrOXYzine (ATARAX/VISTARIL) 25 MG tablet Take 1 tablet (25 mg total) by mouth every 8 (eight) hours as needed for anxiety. Needs appt 90 tablet 0  . loratadine (CLARITIN) 10 MG tablet Take 10 mg by mouth daily as needed.     . metoprolol succinate (TOPROL-XL) 50 MG 24 hr tablet Take 1 tablet (50 mg total) by mouth daily. Take with or immediately following a meal. 90 tablet 0  . Multiple Vitamin (MULTIVITAMIN) tablet Take 1 tablet by mouth daily.    . rizatriptan (MAXALT-MLT) 5 MG disintegrating tablet TAKE 1 TABLET BY MOUTH EVERY 2 HOURS AS DIRECTED. Appointment needed for refills 10 tablet 0  . Simethicone (GAS-X PO) Take by mouth as needed.    . sodium chloride (OCEAN) 0.65 % SOLN nasal spray Place 1 spray into both nostrils as needed for congestion.    . triamcinolone cream (KENALOG) 0.5 % Apply 1 application topically 2 (two) times daily. To affected areas. 30 g 3   No current facility-administered medications on file prior to visit.   Past Medical History:  Diagnosis Date  . Acute  left lumbar radiculopathy 01/30/2019  . Cervico-occipital neuralgia of left side 07/08/2015  . Chest discomfort 03/01/2018  . DVT (deep venous thrombosis) ([place])   . Environmental allergies 02/18/2019  . Facial flushing 03/01/2018  . GAD (generalized anxiety disorder) 02/17/2015  . Genital warts   . GERD (gastroesophageal reflux disease)   . HTN (hypertension) 09/11/2014  . Hypertension   . Lumbar degenerative disc disease 01/30/2019  . 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 Obese 09/23/2015  . Panic attacks 02/20/2019  . Seborrheic dermatitis 07/02/2015  . Sinusitis, chronic 09/11/2014  . Situational anxiety 09/11/2014  . 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  The patient complained of a lump in the upper thigh and this was scar unrelated to any vascular structures.  Assessment: Superficial thrombophlebitis resolving symptoms continue compression stockings and increasing exercise and activity  Numbness left thigh probably more related to lumbar spine disease rather than anything with his veins.  He will discuss further with his primary care physician if the symptoms persist.  Plan: See above follow-up as needed  Ruta Hinds, MD [facility] Office: [phone]

## 2019-09-15 ENCOUNTER — Other Ambulatory Visit: Payer: Self-pay | Admitting: Osteopathic Medicine

## 2019-09-22 ENCOUNTER — Ambulatory Visit (INDEPENDENT_AMBULATORY_CARE_PROVIDER_SITE_OTHER): Payer: BC Managed Care – PPO | Admitting: Sports Medicine

## 2019-09-22 ENCOUNTER — Ambulatory Visit (INDEPENDENT_AMBULATORY_CARE_PROVIDER_SITE_OTHER): Payer: BC Managed Care – PPO

## 2019-09-22 ENCOUNTER — Ambulatory Visit: Payer: BC Managed Care – PPO

## 2019-09-22 ENCOUNTER — Other Ambulatory Visit: Payer: Self-pay

## 2019-09-22 DIAGNOSIS — M25572 Pain in left ankle and joints of left foot: Secondary | ICD-10-CM

## 2019-09-22 MED ORDER — ACETAMINOPHEN ER 650 MG PO TBCR: 650.0000 mg | EXTENDED_RELEASE_TABLET | Freq: Three times a day (TID) | ORAL | 3 refills | Status: DC | PRN

## 2019-09-22 NOTE — Assessment & Plan Note (Addendum)
Brendan Neal has severe pain in his left ankle, he does have a history of DVT so we are going to go ahead and get a DVT ultrasound today. X-rays. History of peptic ulcer disease so we will use Tylenol, I would like him in an ASO, return to see me in 4 weeks, injection if no better. 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

## 2019-09-22 NOTE — Progress Notes (Signed)
    Procedures performed today:    None.  Independent interpretation of notes and tests performed by another provider:   None.  Brief History, Exam, Impression, and Recommendations:    Brendan Neal is a 54 yo pleasant male who comes in with complaints of left ankle pain that began this morning. The pain is on the medial portion of his ankle joint. He has a hx of DVTs. We are going to get a [place] today to r/o DVT. We believe this may be due to OA instead of a clot. We are going to put him in an ASO brace and have him use acetaminophen to reduce inflammation. He will also get an xray of the ankle to evaluate for OA. He will follow up in 4 weeks to reevaluate and determine if further intervention is needed.    Marcelino Duster, MS3   ___________________________________________ Gwen Her. Dianah Field, M.D., ABFM., CAQSM. Primary Care and [place] Instructor of [place] [employer] [facility] Medicine

## 2019-10-03 ENCOUNTER — Encounter: Payer: Self-pay | Admitting: Osteopathic Medicine

## 2019-10-03 ENCOUNTER — Emergency Department (HOSPITAL_COMMUNITY)
Admission: EM | Admit: 2019-10-03 | Discharge: 2019-10-03 | Disposition: A | Discharge status: Home / Self Care | Payer: BC Managed Care – PPO | Attending: Emergency Medicine | Admitting: Emergency Medicine

## 2019-10-03 ENCOUNTER — Ambulatory Visit: Payer: BC Managed Care – PPO | Admitting: Osteopathic Medicine

## 2019-10-03 ENCOUNTER — Other Ambulatory Visit: Payer: Self-pay

## 2019-10-03 VITALS — BP 158/116 | HR 70 | Wt 269.0 lb

## 2019-10-03 DIAGNOSIS — R519 Headache, unspecified: Secondary | ICD-10-CM | POA: Diagnosis present

## 2019-10-03 DIAGNOSIS — I10 Essential (primary) hypertension: Secondary | ICD-10-CM

## 2019-10-03 DIAGNOSIS — R0981 Nasal congestion: Secondary | ICD-10-CM

## 2019-10-03 DIAGNOSIS — F411 Generalized anxiety disorder: Secondary | ICD-10-CM | POA: Diagnosis not present

## 2019-10-03 DIAGNOSIS — Z5321 Procedure and treatment not carried out due to patient leaving prior to being seen by health care provider: Secondary | ICD-10-CM | POA: Diagnosis not present

## 2019-10-03 DIAGNOSIS — R202 Paresthesia of skin: Secondary | ICD-10-CM | POA: Diagnosis not present

## 2019-10-03 LAB — COMPREHENSIVE METABOLIC PANEL
ALT: 23 U/L (ref 0–44)
AST: 20 U/L (ref 15–41)
Albumin: 3.8 g/dL (ref 3.5–5.0)
Alkaline Phosphatase: 73 U/L (ref 38–126)
Anion gap: 11 (ref 5–15)
BUN: 13 mg/dL (ref 6–20)
CO2: 25 mmol/L (ref 22–32)
Calcium: 9.5 mg/dL (ref 8.9–10.3)
Chloride: 104 mmol/L (ref 98–111)
Creatinine, Ser: 1.08 mg/dL (ref 0.61–1.24)
GFR calc Af Amer: >60 mL/min (ref >60)
GFR calc non Af Amer: >60 mL/min (ref >60)
Glucose, Bld: 107 mg/dL — ABNORMAL HIGH (ref 70–99)
Potassium: 4 mmol/L (ref 3.5–5.1)
Sodium: 140 mmol/L (ref 135–145)
Total Bilirubin: 0.8 mg/dL (ref 0.3–1.2)
Total Protein: 6.7 g/dL (ref 6.5–8.1)

## 2019-10-03 LAB — CBC WITH DIFFERENTIAL/PLATELET
Abs Immature Granulocytes: 0.02 10*3/uL (ref 0.00–0.07)
Basophils Absolute: 0 10*3/uL (ref 0.0–0.1)
Basophils Relative: 1 %
Eosinophils Absolute: 0.2 10*3/uL (ref 0.0–0.5)
Eosinophils Relative: 2 %
HCT: 52.4 % — ABNORMAL HIGH (ref 39.0–52.0)
Hemoglobin: 17.3 g/dL — ABNORMAL HIGH (ref 13.0–17.0)
Immature Granulocytes: 0 %
Lymphocytes Relative: 32 %
Lymphs Abs: 2.5 10*3/uL (ref 0.7–4.0)
MCH: 30.2 pg (ref 26.0–34.0)
MCHC: 33 g/dL (ref 30.0–36.0)
MCV: 91.4 fL (ref 80.0–100.0)
Monocytes Absolute: 0.7 10*3/uL (ref 0.1–1.0)
Monocytes Relative: 10 %
Neutro Abs: 4.2 10*3/uL (ref 1.7–7.7)
Neutrophils Relative %: 55 %
Platelets: 209 10*3/uL (ref 150–400)
RBC: 5.73 MIL/uL (ref 4.22–5.81)
RDW: 12.3 % (ref 11.5–15.5)
WBC: 7.6 10*3/uL (ref 4.0–10.5)
nRBC: 0 % (ref 0.0–0.2)

## 2019-10-03 MED ORDER — ONDANSETRON 4 MG PO TBDP
4.0000 mg | ORAL_TABLET | Freq: Once | ORAL | Status: AC | PRN
  Administered 2019-10-03: 4 mg via ORAL
  Filled 2019-10-03: qty 1

## 2019-10-03 MED ORDER — OLMESARTAN MEDOXOMIL 5 MG PO TABS: 5.0000 mg | ORAL_TABLET | Freq: Every day | ORAL | 0 refills | Status: DC

## 2019-10-03 NOTE — ED Notes (Signed)
Pt left going to primary care doctor.

## 2019-10-03 NOTE — Progress Notes (Signed)
Brendan Neal is a 54 y.o. male who presents to  [place] [facility]  today, 10/04/19, seeking care for the following:  . HTN -Home blood pressures have been elevated past couple days, patient was up this morning around 3 AM with blood pressures as high as 180/110.  Significantly anxious about this.  Went to emergency department, basic labs in triage were performed but patient left without being evaluated by physician/APP given that there was a 16-hour wait. . At time of visit in our office, patient reports mild headache mostly resolved, no vision change, no chest pain, no shortness of breath, no lower extremity edema, he is eating and drinking normally. . On exam, CV: RRR, normal S1-S2.  CTA BL BP (!) 158/116 (BP Location: Right Arm, Patient Position: Sitting)   Pulse 70   Wt 269 lb (122 kg)   SpO2 98%   BMI 36.48 kg/m   . Only other complaint at this time is sinus congestion, persistent    BP Readings from Last 3 Encounters:  10/03/19 (!) 159/99  10/03/19 (!) 158/116  09/11/19 (!) 144/97     ASSESSMENT & PLAN with other pertinent findings:  The primary encounter diagnosis was Essential hypertension. Diagnoses of GAD (generalized anxiety disorder) and Sinus congestion were also pertinent to this visit.   See patient instructions below regarding antihypertensive regimen  Discussed consistent use and proper administration of nasal steroid spray, can use Claritin in addition   Patient Instructions  PLAN: Will add Olmesartan 5 mg  Goal BP at least down to 140/90 Ideally 130/80 or less May need to increase Olmesartan to 10 mg  Can go even higher if needed Call me next week w/ BP numbers      Results for orders placed or performed during the hospital encounter of 10/03/19 (from the past 24 hour(s))  CBC with Differential     Status: Abnormal   Collection Time: 10/03/19  5:51 AM  Result Value Ref Range   WBC 7.6 4.0 - 10.5  K/uL   RBC 5.73 4.22 - 5.81 MIL/uL   Hemoglobin 17.3 (H) 13.0 - 17.0 g/dL   HCT 52.4 (H) 39 - 52 %   MCV 91.4 80.0 - 100.0 fL   MCH 30.2 26.0 - 34.0 pg   MCHC 33.0 30.0 - 36.0 g/dL   RDW 12.3 11.5 - 15.5 %   Platelets 209 150 - 400 K/uL   nRBC 0.0 0.0 - 0.2 %   Neutrophils Relative % 55 %   Neutro Abs 4.2 1.7 - 7.7 K/uL   Lymphocytes Relative 32 %   Lymphs Abs 2.5 0.7 - 4.0 K/uL   Monocytes Relative 10 %   Monocytes Absolute 0.7 0 - 1 K/uL   Eosinophils Relative 2 %   Eosinophils Absolute 0.2 0 - 0 K/uL   Basophils Relative 1 %   Basophils Absolute 0.0 0 - 0 K/uL   Immature Granulocytes 0 %   Abs Immature Granulocytes 0.02 0.00 - 0.07 K/uL  Comprehensive metabolic panel     Status: Abnormal   Collection Time: 10/03/19  5:51 AM  Result Value Ref Range   Sodium 140 135 - 145 mmol/L   Potassium 4.0 3.5 - 5.1 mmol/L   Chloride 104 98 - 111 mmol/L   CO2 25 22 - 32 mmol/L   Glucose, Bld 107 (H) 70 - 99 mg/dL   BUN 13 6 - 20 mg/dL   Creatinine, Ser 1.08 0.61 - 1.24  mg/dL   Calcium 9.5 8.9 - 10.3 mg/dL   Total Protein 6.7 6.5 - 8.1 g/dL   Albumin 3.8 3.5 - 5.0 g/dL   AST 20 15 - 41 U/L   ALT 23 0 - 44 U/L   Alkaline Phosphatase 73 38 - 126 U/L   Total Bilirubin 0.8 0.3 - 1.2 mg/dL   GFR calc non Af Amer >60 >60 mL/min   GFR calc Af Amer >60 >60 mL/min   Anion gap 11 5 - 15     No orders of the defined types were placed in this encounter.   Meds ordered this encounter  Medications  . olmesartan (BENICAR) 5 MG tablet    Sig: Take 1 tablet (5 mg total) by mouth daily.    Dispense:  90 tablet    Refill:  0       Follow-up instructions: Return for RECHECK PENDING RESULTS / IF WORSE OR CHANGE.                                           No results found.     All questions at time of visit were answered - patient instructed to contact office with any additional concerns or updates.  ER/RTC precautions were reviewed with the  patient as applicable.   Please note: voice recognition software was used to produce this document, and typos may escape review. Please contact Dr. Sheppard Coil for any needed clarifications.

## 2019-10-03 NOTE — Patient Instructions (Signed)
PLAN: Will add Olmesartan 5 mg  Goal BP at least down to 140/90 Ideally 130/80 or less May need to increase Olmesartan to 10 mg  Can go even higher if needed Call me next week w/ BP numbers

## 2019-10-03 NOTE — ED Triage Notes (Signed)
Pt was at home tonight and was woke from a headache. Pt said pressures were higher than normal. 180/120'S. Pressures were high here too. Pt is having tingling in both hand and both sides of his face. Strengths are equal and strong. No slurred speech. Pt walked with no issues. HX of migraines

## 2019-10-06 ENCOUNTER — Encounter: Payer: Self-pay | Admitting: Osteopathic Medicine

## 2019-10-14 ENCOUNTER — Encounter: Payer: Self-pay | Admitting: Nurse Practitioner

## 2019-10-14 ENCOUNTER — Ambulatory Visit (INDEPENDENT_AMBULATORY_CARE_PROVIDER_SITE_OTHER): Payer: BC Managed Care – PPO | Admitting: Nurse Practitioner

## 2019-10-14 ENCOUNTER — Telehealth: Payer: Self-pay

## 2019-10-14 VITALS — BP 131/92 | HR 91 | Temp 97.8°F | Ht 72.0 in | Wt 252.6 lb

## 2019-10-14 DIAGNOSIS — I809 Phlebitis and thrombophlebitis of unspecified site: Secondary | ICD-10-CM | POA: Diagnosis not present

## 2019-10-14 DIAGNOSIS — M792 Neuralgia and neuritis, unspecified: Secondary | ICD-10-CM | POA: Insufficient documentation

## 2019-10-14 NOTE — Assessment & Plan Note (Signed)
Symptoms and presentation consistent with inflammation of the distal portion of the basilic vein and surrounding tissue of the right hand.  Due to location and presence of vena puncture mark it is assumed that this is a result of recent venipuncture in this area.  Ecchymosis in a healing state however, inflammation is still present along with mild nonpitting edema to the dorsal surface and distal portion of the third fourth and fifth fingers of the right hand.  No evidence of redness or warmth present today.  Capillary refill within normal limits. Recommend conservative treatment consisting of compression to the area, heat, ice, and gentle exercises at this time. Recommend 325mg  of Aspirin twice a day for 7 days followed by 325mg  Aspirin once a day for 7 days.  Patient may continue to use pain relieving rub to the area. Discussed monitoring for worsening inflammation, worsening pain, redness, or inability to move the wrist or fingers which would warrant further evaluation. If symptoms persist beyond 2 weeks, further evaluation with ultrasound would be warranted for further evaluation of the tissue.  Follow-up the week of 10/28/2019 if symptoms persist.

## 2019-10-14 NOTE — Progress Notes (Signed)
Acute Office Visit  Subjective:    Patient ID: Brendan Neal, male    DOB: [birth year], 54 y.o.   MRN: [id number]  Chief Complaint  Patient presents with  . Hand Pain    right hand, went to [facility] ER on 10/03/2019, pt had blood drawn while there and has been having hand pain since then, felt something electric when she inserted the needle, pain radiated within top of hand, intermittent decreased movement, has been wearing brace when pain is worse, pain will spike up to a 8-9 at its worse    HPI Brendan Neal is a 54 year old male presenting today with right-sided hand pain and swelling after blood draw during emergency room visit on 10/03/2019.  He reports that at the time of venipuncture he noted significant "electric" pain in his right wrist traveling distally down the hand into the third fourth and fifth fingers.  He rates the pain at the time of venipuncture 10 out of 10.   He states that since that time the pain has continued intermittently with movement and use of his hand.  He describes the pain as sharp, radiating, poking, and pins-and-needles.  He reports that this pain has caused significant increase in his anxiety as he is fearful of what movement will exacerbate the pain.  He states that sometimes the pain is not present and will rapidly escalate to an 8-9/10 on the pain scale.  He has been using Arnica gel to the hand which he reports does help.  He has also been using a hand brace to keep the hand immobilized in an effort to prevent exacerbation of pain.  He has also been taking Tylenol which he reports is somewhat helpful.  He does express concerns that this injury has caused an increase in his blood pressure readings.  He was recently started on an additional medication for hypertension and he feels the medication may not be working as well as it could be due to his increased pain and anxiety.  Past Medical History:  Diagnosis Date  . Acute left lumbar radiculopathy 01/30/2019    . Cervico-occipital neuralgia of left side 07/08/2015  . Chest discomfort 03/01/2018  . DVT (deep venous thrombosis) ([place])   . Environmental allergies 02/18/2019  . Facial flushing 03/01/2018  . GAD (generalized anxiety disorder) 02/17/2015  . Genital warts   . GERD (gastroesophageal reflux disease)   . HTN (hypertension) 09/11/2014  . Hypertension   . Lumbar degenerative disc disease 01/30/2019  . 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 Obese 09/23/2015  . Panic attacks 02/20/2019  . Seborrheic dermatitis 07/02/2015  . Sinusitis, chronic 09/11/2014  . Situational anxiety 09/11/2014  . Snores 09/11/2014    Past Surgical History:  Procedure Laterality Date  . TONSILLECTOMY    . WISDOM TOOTH EXTRACTION      Family History  Problem Relation Age of Onset  . Alcohol abuse Brother   . Depression Brother   . Diabetes Maternal Aunt   . High blood pressure Mother   . Hypertension Mother   . Hyperlipidemia Mother   . Migraines Father   . High blood pressure Father   . Hypertension Father   . Varicose Veins Father   . High blood pressure Maternal Grandmother   . High blood pressure Maternal Grandfather   . High blood pressure Paternal Grandmother   . Diabetes Paternal Grandfather   . High blood pressure Paternal Grandfather     Social History   Socioeconomic History  . Marital  status: Married    Spouse name: Not on file  . Number of children: Not on file  . Years of education: Not on file  . Highest education level: Not on file  Occupational History  . Not on file  Tobacco Use  . Smoking status: Never Smoker  . Smokeless tobacco: Never Used  Vaping Use  . Vaping Use: Never used  Substance and Sexual Activity  . Alcohol use: No  . Drug use: No  . Sexual activity: Yes  Other Topics Concern  . Not on file  Social History Narrative  . Not on file   Social Determinants of Health   Financial Resource Strain:   . Difficulty of Paying Living Expenses: Not on file  Food  Insecurity:   . Worried About Charity fundraiser in the Last Year: Not on file  . Ran Out of Food in the Last Year: Not on file  Transportation Needs:   . Lack of Transportation (Medical): Not on file  . Lack of Transportation (Non-Medical): Not on file  Physical Activity:   . Days of Exercise per Week: Not on file  . Minutes of Exercise per Session: Not on file  Stress:   . Feeling of Stress : Not on file  Social Connections:   . Frequency of Communication with Friends and Family: Not on file  . Frequency of Social Gatherings with Friends and Family: Not on file  . Attends Religious Services: Not on file  . Active Member of Clubs or Organizations: Not on file  . Attends Archivist Meetings: Not on file  . Marital Status: Not on file  Intimate Partner Violence:   . Fear of Current or Ex-Partner: Not on file  . Emotionally Abused: Not on file  . Physically Abused: Not on file  . Sexually Abused: Not on file    Outpatient Medications Prior to Visit  Medication Sig Dispense Refill  . acetaminophen (TYLENOL) 650 MG CR tablet Take 1 tablet (650 mg total) by mouth every 8 (eight) hours as needed for pain. 90 tablet 3  . Calcium Carbonate Antacid (TUMS PO) Take by mouth daily as needed.     . famotidine (PEPCID) 20 MG tablet TAKE 1 TABLET BY MOUTH TWICE DAILY 180 tablet 1  . fluticasone (FLONASE) 50 MCG/ACT nasal spray Place 1 spray into both nostrils daily as needed for allergies or rhinitis.    . hydrOXYzine (ATARAX/VISTARIL) 25 MG tablet Take 1 tablet (25 mg total) by mouth every 8 (eight) hours as needed for anxiety. Needs appt 90 tablet 0  . loratadine (CLARITIN) 10 MG tablet Take 10 mg by mouth daily as needed.     . metoprolol succinate (TOPROL-XL) 50 MG 24 hr tablet TAKE 1 TABLET(50 MG) BY MOUTH DAILY WITH OR IMMEDIATELY FOLLOWING A MEAL 90 tablet 0  . Multiple Vitamin (MULTIVITAMIN) tablet Take 1 tablet by mouth daily.    Marland Kitchen olmesartan (BENICAR) 5 MG tablet Take 1  tablet (5 mg total) by mouth daily. 90 tablet 0  . rizatriptan (MAXALT-MLT) 5 MG disintegrating tablet TAKE 1 TABLET BY MOUTH EVERY 2 HOURS AS DIRECTED. Appointment needed for refills 10 tablet 0  . Simethicone (GAS-X PO) Take by mouth as needed.    . sodium chloride (OCEAN) 0.65 % SOLN nasal spray Place 1 spray into both nostrils as needed for congestion.    . triamcinolone cream (KENALOG) 0.5 % Apply 1 application topically 2 (two) times daily. To affected areas. 30 g 3  No facility-administered medications prior to visit.    Allergies  Allergen Reactions  . Singulair [Montelukast]     Feels weird   . Sudafed [Pseudoephedrine]     Heart racing.       Objective:    Physical Exam Vitals and nursing note reviewed.  Constitutional:      Appearance: Normal appearance.  HENT:     Head: Normocephalic.  Eyes:     Extraocular Movements: Extraocular movements intact.     Conjunctiva/sclera: Conjunctivae normal.     Pupils: Pupils are equal, round, and reactive to light.  Cardiovascular:     Rate and Rhythm: Normal rate.     Pulses: Normal pulses.  Pulmonary:     Effort: Pulmonary effort is normal.  Musculoskeletal:        General: Swelling, tenderness and signs of injury present.     Cervical back: Normal range of motion.  Skin:    General: Skin is warm and dry.     Capillary Refill: Capillary refill takes less than 2 seconds.     Findings: Bruising, ecchymosis and signs of injury present.          Comments: Significant tenderness to light palpation noted over the area of ecchymosis. Mild swelling to the distal portion of the right hand along the third fourth and fifth fingers. Capillary refill less than 2 seconds. Range of motion to the wrist and last 3 fingers; including flexion, extension, lateral movement, extension decreased due to exacerbation of pain. With the exception of the ecchymosis at the level of the wrist, coloration to the skin is within normal limits.    Neurological:     General: No focal deficit present.     Mental Status: He is alert and oriented to person, place, and time.  Psychiatric:        Attention and Perception: Attention normal.        Mood and Affect: Mood is anxious.        Speech: Speech normal.        Behavior: Behavior normal. Behavior is cooperative.        Thought Content: Thought content normal.        Cognition and Memory: Cognition normal.        Judgment: Judgment normal.     BP (!) 131/92   Pulse 91   Temp 97.8 F (36.6 C) (Oral)   Ht 6' (1.829 m)   Wt 252 lb 9.6 oz (114.6 kg)   SpO2 96%   BMI 34.26 kg/m  Wt Readings from Last 3 Encounters:  10/14/19 252 lb 9.6 oz (114.6 kg)  10/03/19 269 lb (122 kg)  09/11/19 (!) 271 lb (122.9 kg)    Health Maintenance Due  Topic Date Due  . INFLUENZA VACCINE  09/21/2019    There are no preventive care reminders to display for this patient.   Lab Results  Component Value Date   TSH 3.19 04/02/2018   Lab Results  Component Value Date   WBC 7.6 10/03/2019   HGB 17.3 (H) 10/03/2019   HCT 52.4 (H) 10/03/2019   MCV 91.4 10/03/2019   PLT 209 10/03/2019   Lab Results  Component Value Date   NA 140 10/03/2019   K 4.0 10/03/2019   CO2 25 10/03/2019   GLUCOSE 107 (H) 10/03/2019   BUN 13 10/03/2019   CREATININE 1.08 10/03/2019   BILITOT 0.8 10/03/2019   ALKPHOS 73 10/03/2019   AST 20 10/03/2019   ALT 23 10/03/2019  PROT 6.7 10/03/2019   ALBUMIN 3.8 10/03/2019   CALCIUM 9.5 10/03/2019   ANIONGAP 11 10/03/2019   Lab Results  Component Value Date   CHOL 175 04/02/2018   Lab Results  Component Value Date   HDL 56 04/02/2018   Lab Results  Component Value Date   LDLCALC 98 04/02/2018   Lab Results  Component Value Date   TRIG 109 04/02/2018   Lab Results  Component Value Date   CHOLHDL 3.1 04/02/2018   No results found for: HGBA1C     Assessment & Plan:   Problem List Items Addressed This Visit      Cardiovascular and Mediastinum    Phlebitis    Symptoms and presentation consistent with inflammation of the distal portion of the basilic vein and surrounding tissue of the right hand.  Due to location and presence of vena puncture mark it is assumed that this is a result of recent venipuncture in this area.  Ecchymosis in a healing state however, inflammation is still present along with mild nonpitting edema to the dorsal surface and distal portion of the third fourth and fifth fingers of the right hand.  No evidence of redness or warmth present today.  Capillary refill within normal limits. Recommend conservative treatment consisting of compression to the area, heat, ice, and gentle exercises at this time. Recommend 325mg  of Aspirin twice a day for 7 days followed by 325mg  Aspirin once a day for 7 days.  Patient may continue to use pain relieving rub to the area. Discussed monitoring for worsening inflammation, worsening pain, redness, or inability to move the wrist or fingers which would warrant further evaluation. If symptoms persist beyond 2 weeks, further evaluation with ultrasound would be warranted for further evaluation of the tissue.  Follow-up the week of 10/28/2019 if symptoms persist.          Other   Neuropathic pain of hand, right - Primary    Symptoms and presentation consistent with neuropathic pain to the distal portion of the ulnar nerve in the right hand and fingers following venipuncture with suspected injury to the ulnar nerve.  Presence of phlebitis to the area from venipuncture likely contributing to the increased symptoms the patient is experiencing. Discussed treatment for phlebitis and conservative measures that may help alleviate pain at this time. Discussed with the patient that nerve pain may resolve with decreased inflammation however if nerve damage is present this may persist. Discussed with the patient the option of gabapentin at bedtime to help with the neuropathic pain however, he declined  at this time.  Discussed with him if he does change his mind about this he can notify me and we will send this in for him. Plan to utilize conservative measures for phlebitis for 2 weeks.  If symptoms persist patient to return for further evaluation.  At that time will consider ultrasound and nerve conduction studies.           Follow-up the week of 10/28/2019 if symptoms persist may need nerve conduction studies or ultrasound of the right wrist/hand for further evaluation.   Orma Render, NP

## 2019-10-14 NOTE — Assessment & Plan Note (Signed)
Symptoms and presentation consistent with neuropathic pain to the distal portion of the ulnar nerve in the right hand and fingers following venipuncture with suspected injury to the ulnar nerve.  Presence of phlebitis to the area from venipuncture likely contributing to the increased symptoms the patient is experiencing. Discussed treatment for phlebitis and conservative measures that may help alleviate pain at this time. Discussed with the patient that nerve pain may resolve with decreased inflammation however if nerve damage is present this may persist. Discussed with the patient the option of gabapentin at bedtime to help with the neuropathic pain however, he declined at this time.  Discussed with him if he does change his mind about this he can notify me and we will send this in for him. Plan to utilize conservative measures for phlebitis for 2 weeks.  If symptoms persist patient to return for further evaluation.  At that time will consider ultrasound and nerve conduction studies.

## 2019-10-14 NOTE — Patient Instructions (Addendum)
I would like you to start taking Aspirin 325mg  twice a day for 1 week. Then once a day for until September 10th. This will be the 4 week mark from your injury.   I would like you to keep compression on the hand for at least the next 7 days. You can continue this longer if the bruise is not fully resolved at that time.   I would like you to use gentle stretching exercises at least three times a day. If the exercises cause pain, then try them again at another time.   We have the option of trying a medication called Gabapentin to help with the nerve pain for short term. This medication can cause sleepiness so do not take this if you have to be driving or doing other activities that require you to be alert.   I would like you to alternate ice and heat to the area for the next 4-7 days. I recommend 20 minutes of each 4-6 times a day.   If the pain is still present the second week in September, we can move forward to ultrasound of the area and possible nerve conduction studies.   If at any time the swelling becomes worse, the pain becomes worse, or you are unable to use your hand, please seek evaluation.    Phlebitis Phlebitis is soreness and swelling (inflammation) of a vein. Follow these instructions at home: Managing pain, stiffness, and swelling  If told, apply heat to the affected area. Do this as often as told by your doctor. Use the heat source that your doctor tells you to use. This may include a moist heat pack or a heating pad. ? Place a towel between your skin and the heat source. ? Leave the heat on for 20-30 minutes. ? Take off the heat if your skin turns bright red. This is very important if you cannot feel pain, heat, or cold. You may be more likely to get burned.  Raise (elevate) the affected area above the level of your heart while you are sitting or lying down. Medicines  Take over-the-counter and prescription medicines only as told by your doctor.  If you were prescribed an  antibiotic medicine, take it as told by your doctor. Do not stop taking the antibiotic even if your condition gets better.  If you take medicines to thin your blood, carry a medical alert card or wear your medical alert jewelry. General instructions   If you have phlebitis in your legs: ? Do not stand or sit for a long time. ? Keep your legs moving. ? Get up and take short walks if you have to sit for a long time. ? Try to avoid bed rest that lasts for a long time. Regular sleep is not bed rest.  Wear compression stockings as told by your doctor. These stockings help: ? To reduce swelling in your legs. ? To prevent blood clots. ? To stop the condition from coming back.  Do not use any products that contain nicotine or tobacco, such as cigarettes and e-cigarettes. If you need help quitting, ask your doctor.  Keep all follow-up visits as told by your doctor. This is important. This may include any follow-up blood tests. Contact a doctor if:  You have strange bruises.  You have bleeding problems.  Your symptoms do not get better.  Your symptoms get worse.  You are taking medicine to treat swelling (anti-inflammatory medicine) and you get belly (abdominal) pain. Get help right away if:  You have sudden chest pain.  You suddenly have trouble breathing.  You have a fever and your symptoms get worse.  You cough up blood.  You feel dizzy or you pass out.  You have very bad pain and swelling in the affected arm or leg. These symptoms may be an emergency. Do not wait to see if the symptoms will go away. Get medical help right away. Call your local emergency services (911 in the [place] Do not drive yourself to the hospital. Summary  Phlebitis is soreness and swelling (inflammation) of a vein.  Raise (elevate) the affected area above the level of your heart while you are sitting or lying down.  If told, apply heat to the affected area. Do this as often as told by your doctor.  Use the heat source that your doctor tells you to use. This may include a moist heat pack or a heating pad.  Take over-the-counter and prescription medicines only as told by your doctor. This information is not intended to replace advice given to you by your health care provider. Make sure you discuss any questions you have with your health care provider. Document Revised: 03/19/2018 Document Reviewed: 03/14/2016 Elsevier Patient Education  2020 [place]. Neuropathic Pain Neuropathic pain is pain caused by damage to the nerves that are responsible for certain sensations in your body (sensory nerves). The pain can be caused by:  Damage to the sensory nerves that send signals to your spinal cord and brain (peripheral nervous system).  Damage to the sensory nerves in your brain or spinal cord (central nervous system). Neuropathic pain can make you more sensitive to pain. Even a minor sensation can feel very painful. This is usually a long-term condition that can be difficult to treat. The type of pain differs from person to person. It may:  Start suddenly (acute), or it may develop slowly and last for a long time (chronic).  Come and go as damaged nerves heal, or it may stay at the same level for years.  Cause emotional distress, loss of sleep, and a lower quality of life. What are the causes? The most common cause of this condition is diabetes. Many other diseases and conditions can also cause neuropathic pain. Causes of neuropathic pain can be classified as:  Toxic. This is caused by medicines and chemicals. The most common cause of toxic neuropathic pain is damage from cancer treatments (chemotherapy).  Metabolic. This can be caused by: ? Diabetes. This is the most common disease that damages the nerves. ? Lack of vitamin B from long-term alcohol abuse.  Traumatic. Any injury that cuts, crushes, or stretches a nerve can cause damage and pain. A common example is feeling pain after  losing an arm or leg (phantom limb pain).  Compression-related. If a sensory nerve gets trapped or compressed for a long period of time, the blood supply to the nerve can be cut off.  Vascular. Many blood vessel diseases can cause neuropathic pain by decreasing blood supply and oxygen to nerves.  Autoimmune. This type of pain results from diseases in which the body's defense system (immune system) mistakenly attacks sensory nerves. Examples of autoimmune diseases that can cause neuropathic pain include lupus and multiple sclerosis.  Infectious. Many types of viral infections can damage sensory nerves and cause pain. Shingles infection is a common cause of this type of pain.  Inherited. Neuropathic pain can be a symptom of many diseases that are passed down through families (genetic). What increases the risk?  You are more likely to develop this condition if:  You have diabetes.  You smoke.  You drink too much alcohol.  You are taking certain medicines, including medicines that kill cancer cells (chemotherapy) or that treat immune system disorders. What are the signs or symptoms? The main symptom is pain. Neuropathic pain is often described as:  Burning.  Shock-like.  Stinging.  Hot or cold.  Itching. How is this diagnosed? No single test can diagnose neuropathic pain. It is diagnosed based on:  Physical exam and your symptoms. Your health care provider will ask you about your pain. You may be asked to use a pain scale to describe how bad your pain is.  Tests. These may be done to see if you have a high sensitivity to pain and to help find the cause and location of any sensory nerve damage. They include: ? Nerve conduction studies to test how well nerve signals travel through your sensory nerves (electrodiagnostic testing). ? Stimulating your sensory nerves through electrodes on your skin and measuring the response in your spinal cord and brain (somatosensory evoked  potential).  Imaging studies, such as: ? X-rays. ? CT scan. ? MRI. How is this treated? Treatment for neuropathic pain may change over time. You may need to try different treatment options or a combination of treatments. Some options include:  Treating the underlying cause of the neuropathy, such as diabetes, kidney disease, or vitamin deficiencies.  Stopping medicines that can cause neuropathy, such as chemotherapy.  Medicine to relieve pain. Medicines may include: ? Prescription or over-the-counter pain medicine. ? Anti-seizure medicine. ? Antidepressant medicines. ? Pain-relieving patches that are applied to painful areas of skin. ? A medicine to numb the area (local anesthetic), which can be injected as a nerve block.  Transcutaneous nerve stimulation. This uses electrical currents to block painful nerve signals. The treatment is painless.  Alternative treatments, such as: ? Acupuncture. ? Meditation. ? Massage. ? Physical therapy. ? Pain management programs. ? Counseling. Follow these instructions at home: Medicines   Take over-the-counter and prescription medicines only as told by your health care provider.  Do not drive or use heavy machinery while taking prescription pain medicine.  If you are taking prescription pain medicine, take actions to prevent or treat constipation. Your health care provider may recommend that you: ? Drink enough fluid to keep your urine pale yellow. ? Eat foods that are high in fiber, such as fresh fruits and vegetables, whole grains, and beans. ? Limit foods that are high in fat and processed sugars, such as fried or sweet foods. ? Take an over-the-counter or prescription medicine for constipation. Lifestyle   Have a good support system at home.  Consider joining a chronic pain support group.  Do not use any products that contain nicotine or tobacco, such as cigarettes and e-cigarettes. If you need help quitting, ask your health care  provider.  Do not drink alcohol. General instructions  Learn as much as you can about your condition.  Work closely with all your health care providers to find the treatment plan that works best for you.  Ask your health care provider what activities are safe for you.  Keep all follow-up visits as told by your health care provider. This is important. Contact a health care provider if:  Your pain treatments are not working.  You are having side effects from your medicines.  You are struggling with tiredness (fatigue), mood changes, depression, or anxiety. Summary  Neuropathic pain is pain  caused by damage to the nerves that are responsible for certain sensations in your body (sensory nerves).  Neuropathic pain may come and go as damaged nerves heal, or it may stay at the same level for years.  Neuropathic pain is usually a long-term condition that can be difficult to treat. Consider joining a chronic pain support group. This information is not intended to replace advice given to you by your health care provider. Make sure you discuss any questions you have with your health care provider. Document Revised: 05/30/2018 Document Reviewed: 02/23/2017 Elsevier Patient Education  [place].

## 2019-10-14 NOTE — Telephone Encounter (Signed)
Pt had an OV with Emeterio Reeve Early, DNP, AGNP-C this morning regarding hand pain. He brought his BP log with him for review since starting olmesartan 5 mg on 10/03/2019. Copy of log placed in Dr. Redgie Grayer inbox for review.

## 2019-10-20 ENCOUNTER — Ambulatory Visit: Payer: BC Managed Care – PPO | Admitting: Sports Medicine

## 2019-10-28 ENCOUNTER — Encounter: Payer: Self-pay | Admitting: Osteopathic Medicine

## 2019-10-28 ENCOUNTER — Ambulatory Visit (INDEPENDENT_AMBULATORY_CARE_PROVIDER_SITE_OTHER): Payer: BC Managed Care – PPO | Admitting: Osteopathic Medicine

## 2019-10-28 VITALS — BP 135/97 | HR 88 | Wt 267.0 lb

## 2019-10-28 DIAGNOSIS — M792 Neuralgia and neuritis, unspecified: Secondary | ICD-10-CM | POA: Diagnosis not present

## 2019-10-28 DIAGNOSIS — Z713 Dietary counseling and surveillance: Secondary | ICD-10-CM | POA: Diagnosis not present

## 2019-10-28 NOTE — Progress Notes (Signed)
Brendan Neal is a 54 y.o. male who presents to  [place] at [facility]  today, 10/28/19, seeking care for the following:  . Recently seen for neuropathic pain in R hand following blood draw, which has since improved w/ aspirin and acetaminophen. This has improved nicely, still some minor swelling/numbness but this is much better.  . Has questions re: plant based diet for weight loss goals        ASSESSMENT & PLAN with other pertinent findings:  The primary encounter diagnosis was Neuropathic pain of hand, right. A diagnosis of Dietary counseling was also pertinent to this visit.   No results found for this or any previous visit (from the past 24 hour(s)).  Discussed that I do not have any reason to think plant based diet would cause thrombophilia. Advised against strict veganism d/t concern for other nutrient deficiencies.   Neuropathic pain improving/resolveing, can monitor    There are no Patient Instructions on file for this visit.  No orders of the defined types were placed in this encounter.   No orders of the defined types were placed in this encounter.      Follow-up instructions: Return in about 6 months (around 04/26/2020) for [employer].                                         BP (!) 135/97 (BP Location: Left Arm, Patient Position: Sitting)   Pulse 88   Wt 267 lb (121.1 kg)   SpO2 97%   BMI 36.21 kg/m   Current Meds  Medication Sig  . acetaminophen (TYLENOL) 650 MG CR tablet Take 1 tablet (650 mg total) by mouth every 8 (eight) hours as needed for pain.  Marland Kitchen aspirin EC 81 MG tablet Take 81 mg by mouth daily. Swallow whole.  . Calcium Carbonate Antacid (TUMS PO) Take by mouth daily as needed.   . famotidine (PEPCID) 20 MG tablet TAKE 1 TABLET BY MOUTH TWICE DAILY  . fluticasone (FLONASE) 50 MCG/ACT nasal spray Place 1 spray into both nostrils daily as needed for allergies or  rhinitis.  . hydrOXYzine (ATARAX/VISTARIL) 25 MG tablet Take 1 tablet (25 mg total) by mouth every 8 (eight) hours as needed for anxiety. Needs appt  . loratadine (CLARITIN) 10 MG tablet Take 10 mg by mouth daily as needed.   . metoprolol succinate (TOPROL-XL) 50 MG 24 hr tablet TAKE 1 TABLET(50 MG) BY MOUTH DAILY WITH OR IMMEDIATELY FOLLOWING A MEAL  . Multiple Vitamin (MULTIVITAMIN) tablet Take 1 tablet by mouth daily.  Marland Kitchen olmesartan (BENICAR) 5 MG tablet Take 1 tablet (5 mg total) by mouth daily.  . rizatriptan (MAXALT-MLT) 5 MG disintegrating tablet TAKE 1 TABLET BY MOUTH EVERY 2 HOURS AS DIRECTED. Appointment needed for refills  . Simethicone (GAS-X PO) Take by mouth as needed.  . sodium chloride (OCEAN) 0.65 % SOLN nasal spray Place 1 spray into both nostrils as needed for congestion.  . triamcinolone cream (KENALOG) 0.5 % Apply 1 application topically 2 (two) times daily. To affected areas.    No results found for this or any previous visit (from the past 72 hour(s)).  No results found.     All questions at time of visit were answered - patient instructed to contact office with any additional concerns or updates.  ER/RTC precautions were reviewed with the patient as applicable.  Please note: voice recognition software was used to produce this document, and typos may escape review. Please contact Dr. Sheppard Coil for any needed clarifications.   Total encounter time: 20 minutes.

## 2019-11-06 ENCOUNTER — Ambulatory Visit: Payer: BC Managed Care – PPO | Admitting: Cardiology

## 2019-12-16 ENCOUNTER — Other Ambulatory Visit: Payer: Self-pay | Admitting: Osteopathic Medicine

## 2019-12-23 ENCOUNTER — Other Ambulatory Visit: Payer: Self-pay | Admitting: *Deleted

## 2019-12-23 ENCOUNTER — Encounter: Payer: Self-pay | Admitting: Osteopathic Medicine

## 2019-12-23 MED ORDER — OLMESARTAN MEDOXOMIL 5 MG PO TABS: 5.0000 mg | ORAL_TABLET | Freq: Every day | ORAL | 0 refills | Status: DC

## 2020-01-27 ENCOUNTER — Ambulatory Visit: Payer: BC Managed Care – PPO | Attending: Internal Medicine

## 2020-01-27 DIAGNOSIS — Z23 Encounter for immunization: Secondary | ICD-10-CM

## 2020-01-27 NOTE — Progress Notes (Signed)
° °  Covid-19 Vaccination Clinic  Name:  Brendan Neal    MRN: [id number] DOB: [birth year]  01/27/2020  Brendan Neal was observed post Covid-19 immunization for 15 minutes without incident. He was provided with Vaccine Information Sheet and instruction to access the V-Safe system.   Brendan Neal was instructed to call 911 with any severe reactions post vaccine:  Difficulty breathing   Swelling of face and throat   A fast heartbeat   A bad rash all over body   Dizziness and weakness   Immunizations Administered    No immunizations on file.

## 2020-02-05 ENCOUNTER — Other Ambulatory Visit: Payer: Self-pay | Admitting: Osteopathic Medicine

## 2020-02-05 DIAGNOSIS — F411 Generalized anxiety disorder: Secondary | ICD-10-CM

## 2020-02-05 DIAGNOSIS — F410 Panic disorder [episodic paroxysmal anxiety] without agoraphobia: Secondary | ICD-10-CM

## 2020-03-15 ENCOUNTER — Other Ambulatory Visit: Payer: Self-pay | Admitting: Osteopathic Medicine

## 2020-06-01 ENCOUNTER — Encounter: Payer: Self-pay | Admitting: Osteopathic Medicine

## 2020-06-22 ENCOUNTER — Other Ambulatory Visit: Payer: Self-pay | Admitting: Osteopathic Medicine

## 2020-07-23 ENCOUNTER — Other Ambulatory Visit: Payer: Self-pay | Admitting: Osteopathic Medicine

## 2020-07-29 ENCOUNTER — Other Ambulatory Visit (HOSPITAL_BASED_OUTPATIENT_CLINIC_OR_DEPARTMENT_OTHER): Payer: Self-pay

## 2020-07-29 ENCOUNTER — Ambulatory Visit: Payer: Managed Care, Other (non HMO) | Attending: Internal Medicine

## 2020-07-29 ENCOUNTER — Other Ambulatory Visit: Payer: Self-pay

## 2020-07-29 DIAGNOSIS — Z23 Encounter for immunization: Secondary | ICD-10-CM

## 2020-07-29 MED ORDER — PFIZER-BIONT COVID-19 VAC-TRIS 30 MCG/0.3ML IM SUSP
INTRAMUSCULAR | 0 refills | Status: DC
  Filled 2020-07-29: qty 0.3, 1d supply, fill #0

## 2020-07-29 NOTE — Progress Notes (Signed)
   Covid-19 Vaccination Clinic  Name:  Brendan Neal    MRN: [id number] DOB: 1965/10/07  07/29/2020  Brendan Neal was observed post Covid-19 immunization for 15 minutes without incident. He was provided with Vaccine Information Sheet and instruction to access the V-Safe system.   Brendan Neal was instructed to call 911 with any severe reactions post vaccine: Difficulty breathing  Swelling of face and throat  A fast heartbeat  A bad rash all over body  Dizziness and weakness   Immunizations Administered     Name Date Dose VIS Date Route   Moderna Covid-19 Booster Vaccine 07/29/2020  1:59 PM 0.25 mL 12/10/2019 Intramuscular   Manufacturer: Moderna   Lot: [id number]   [place]: [id number]

## 2020-09-12 ENCOUNTER — Other Ambulatory Visit: Payer: Self-pay | Admitting: Osteopathic Medicine

## 2020-10-29 ENCOUNTER — Encounter: Payer: Self-pay | Admitting: Osteopathic Medicine

## 2020-10-29 NOTE — Telephone Encounter (Signed)
Patient has been scheduled with Dr Zigmund Daniel for next week to discuss BP & transfer care from Dr Loni Muse

## 2020-11-03 ENCOUNTER — Encounter: Payer: Self-pay | Admitting: Family Medicine

## 2020-11-03 ENCOUNTER — Other Ambulatory Visit: Payer: Self-pay

## 2020-11-03 ENCOUNTER — Ambulatory Visit: Payer: Managed Care, Other (non HMO) | Admitting: Family Medicine

## 2020-11-03 VITALS — BP 125/86 | HR 75 | Ht 72.0 in | Wt 274.0 lb

## 2020-11-03 DIAGNOSIS — F418 Other specified anxiety disorders: Secondary | ICD-10-CM

## 2020-11-03 DIAGNOSIS — D485 Neoplasm of uncertain behavior of skin: Secondary | ICD-10-CM | POA: Diagnosis not present

## 2020-11-03 DIAGNOSIS — E6609 Other obesity due to excess calories: Secondary | ICD-10-CM

## 2020-11-03 DIAGNOSIS — I10 Essential (primary) hypertension: Secondary | ICD-10-CM

## 2020-11-03 DIAGNOSIS — Z6833 Body mass index (BMI) 33.0-33.9, adult: Secondary | ICD-10-CM

## 2020-11-03 DIAGNOSIS — K219 Gastro-esophageal reflux disease without esophagitis: Secondary | ICD-10-CM

## 2020-11-03 NOTE — Progress Notes (Signed)
Brendan Neal - 55 y.o. male MRN [id number]  Date of birth: Apr 10, 1965  Subjective Chief Complaint  Patient presents with   Hypertension    HPI Brendan Neal is a 55 year old male here today for initial visit with me.  He was previously seeing Dr. Sheppard Coil.  He has a few concerns today that he would like to discuss.  Reports his blood pressure has been fluctuating over the past several weeks.  Admits to increased stress related to work as well as poor diet and exercise habits.  He is taking olmesartan as well as metoprolol for management of hypertension.  He has started cycling more however feels like his energy is depleted at the end of his ride.  He denies anginal symptoms associated with this.  He does have hydroxyzine as needed for anxiety.  He is trying to avoid taking a medication daily for his anxiety.  Reports GI symptoms of cramping and bloating.  He denies any worsening with any certain foods.  He does have nausea occasionally.  Bowels are moving normally.  He does have some skin lesions that he would like evaluated by dermatology.  ROS:  A comprehensive ROS was completed and negative except as noted per HPI  Allergies  Allergen Reactions   Singulair [Montelukast]     Feels weird    Sudafed [Pseudoephedrine]     Heart racing.     Past Medical History:  Diagnosis Date   Acute left lumbar radiculopathy 01/30/2019   Cervico-occipital neuralgia of left side 07/08/2015   Chest discomfort 03/01/2018   DVT (deep venous thrombosis) (HCC)    Environmental allergies 02/18/2019   Facial flushing 03/01/2018   GAD (generalized anxiety disorder) 02/17/2015   Genital warts    GERD (gastroesophageal reflux disease)    HTN (hypertension) 09/11/2014   Hypertension    Lumbar degenerative disc disease 01/30/2019   Nasal septal deviation 10/06/2016   Obese 09/23/2015   Panic attacks 02/20/2019   Seborrheic dermatitis 07/02/2015   Sinusitis, chronic 09/11/2014   Situational anxiety 09/11/2014    Snores 09/11/2014    Past Surgical History:  Procedure Laterality Date   TONSILLECTOMY     WISDOM TOOTH EXTRACTION      Social History   Socioeconomic History   Marital status: Married    Spouse name: Not on file   Number of children: Not on file   Years of education: Not on file   Highest education level: Not on file  Occupational History   Not on file  Tobacco Use   Smoking status: Never   Smokeless tobacco: Never  Vaping Use   Vaping Use: Never used  Substance and Sexual Activity   Alcohol use: No   Drug use: No   Sexual activity: Yes  Other Topics Concern   Not on file  Social History Narrative   Not on file   Social Determinants of Health   Financial Resource Strain: Not on file  Food Insecurity: Not on file  Transportation Needs: Not on file  Physical Activity: Not on file  Stress: Not on file  Social Connections: Not on file    Family History  Problem Relation Age of Onset   Alcohol abuse Brother    Depression Brother    Diabetes Maternal Aunt    High blood pressure Mother    Hypertension Mother    Hyperlipidemia Mother    Migraines Father    High blood pressure Father    Hypertension Father    Varicose Veins Father  High blood pressure Maternal Grandmother    High blood pressure Maternal Grandfather    High blood pressure Paternal Grandmother    Diabetes Paternal Grandfather    High blood pressure Paternal Grandfather     Health Maintenance  Topic Date Due   Pneumococcal Vaccine 23-41 Years old (1 - PCV) Never done   Hepatitis C Screening  Never done   Zoster Vaccines- Shingrix (1 of 2) Never done   COLONOSCOPY (Pts 45-40yr Insurance coverage will need to be confirmed)  Never done   INFLUENZA VACCINE  Never done   COVID-19 Vaccine (5 - Booster for Moderna series) 11/28/2020   TETANUS/TDAP  09/22/2025   HIV Screening  Completed   HPV VACCINES  Aged Out      ----------------------------------------------------------------------------------------------------------------------------------------------------------------------------------------------------------------- Physical Exam BP 125/86 (BP Location: Left Arm, Patient Position: Sitting, Cuff Size: Large)   Pulse 75   Ht 6' (1.829 m)   Wt 274 lb (124.3 kg)   SpO2 98%   BMI 37.16 kg/m   Physical Exam Constitutional:      Appearance: Normal appearance.  HENT:     Head: Normocephalic and atraumatic.  Eyes:     General: No scleral icterus. Cardiovascular:     Rate and Rhythm: Normal rate and regular rhythm.  Pulmonary:     Effort: Pulmonary effort is normal.     Breath sounds: Normal breath sounds.  Musculoskeletal:     Cervical back: Neck supple.  Neurological:     Mental Status: He is alert.  Psychiatric:        Mood and Affect: Mood normal.        Behavior: Behavior normal.    ------------------------------------------------------------------------------------------------------------------------------------------------------------------------------------------------------------------- Assessment and Plan  HTN (hypertension) BP is fairly well controlled in clinic today.  Stress and diet are likely big contributors to his fluctuating blood pressure.  Given information on DASH diet.  Discussed working on sChild psychotherapist  No follow-ups on file.   Situational anxiety He will continue vistaril as needed.    Obese We discussed diet and lifestyle change.  Easing into exercise to build tolerance.    GERD (gastroesophageal reflux disease) He has had increased bloating and cramping in addition to reflux.  I think this is mostly diet related and we discussed some changes.  There may be a component of IBS contributing as well.    No orders of the defined types were placed in this encounter.   No follow-ups on file.    This visit occurred during the SARS-CoV-2 public  health emergency.  Safety protocols were in place, including screening questions prior to the visit, additional usage of staff PPE, and extensive cleaning of exam room while observing appropriate contact time as indicated for disinfecting solutions.

## 2020-11-03 NOTE — Assessment & Plan Note (Signed)
We discussed diet and lifestyle change.  Easing into exercise to build tolerance.

## 2020-11-03 NOTE — Assessment & Plan Note (Signed)
He has had increased bloating and cramping in addition to reflux.  I think this is mostly diet related and we discussed some changes.  There may be a component of IBS contributing as well.

## 2020-11-03 NOTE — Assessment & Plan Note (Signed)
He will continue vistaril as needed.

## 2020-11-03 NOTE — Patient Instructions (Signed)

## 2020-11-03 NOTE — Assessment & Plan Note (Signed)
BP is fairly well controlled in clinic today.  Stress and diet are likely big contributors to his fluctuating blood pressure.  Given information on DASH diet.  Discussed working on Child psychotherapist.  No follow-ups on file.

## 2020-12-16 ENCOUNTER — Other Ambulatory Visit: Payer: Self-pay | Admitting: Osteopathic Medicine

## 2021-01-19 ENCOUNTER — Other Ambulatory Visit: Payer: Self-pay | Admitting: Family Medicine

## 2021-01-21 ENCOUNTER — Encounter: Payer: Self-pay | Admitting: Family Medicine

## 2021-01-21 NOTE — Telephone Encounter (Signed)
Non-urgent.  Wanting routine skin check.  He can check around to see if there is another office with sooner availability.

## 2021-01-26 ENCOUNTER — Ambulatory Visit: Payer: Managed Care, Other (non HMO) | Admitting: Family Medicine

## 2021-03-10 ENCOUNTER — Telehealth: Payer: Managed Care, Other (non HMO) | Admitting: Physician Assistant

## 2021-03-10 DIAGNOSIS — R6889 Other general symptoms and signs: Secondary | ICD-10-CM

## 2021-03-10 MED ORDER — OSELTAMIVIR PHOSPHATE 75 MG PO CAPS: 75.0000 mg | ORAL_CAPSULE | Freq: Two times a day (BID) | ORAL | 0 refills | Status: DC

## 2021-03-10 NOTE — Progress Notes (Signed)
I have spent 5 minutes in review of e-visit questionnaire, review and updating patient chart, medical decision making and response to patient.   Ghalia Reicks Cody Mort Smelser, PA-C    

## 2021-03-10 NOTE — Progress Notes (Signed)
E visit for Flu like symptoms   We are sorry that you are not feeling well.  Here is how we plan to help! Based on what you have shared with me it looks like you may have a respiratory virus that may be influenza.  Influenza or the flu is   an infection caused by a respiratory virus. The flu virus is highly contagious and persons who did not receive their yearly flu vaccination may catch the flu from close contact.  We have anti-viral medications to treat the viruses that cause this infection. They are not a cure and only shorten the course of the infection. These prescriptions are most effective when they are given within the first 2 days of flu symptoms. Antiviral medication are indicated if you have a high risk of complications from the flu. You should  also consider an antiviral medication if you are in close contact with someone who is at risk. These medications can help patients avoid complications from the flu  but have side effects that you should know. Possible side effects from Tamiflu or oseltamivir include nausea, vomiting, diarrhea, dizziness, headaches, eye redness, sleep problems or other respiratory symptoms. You should not take Tamiflu if you have an allergy to oseltamivir or any to the ingredients in Tamiflu.  Based upon your symptoms and potential risk factors I have prescribed Oseltamivir (Tamiflu).  It has been sent to your designated pharmacy.  You will take one 75 mg capsule orally twice a day for the next 5 days.   For nasal congestion, you may use an oral decongestants such as Mucinex D or if you have glaucoma or high blood pressure use plain Mucinex.  Saline nasal spray or nasal drops can help and can safely be used as often as needed for congestion.    If you do not have a history of heart disease, hypertension, diabetes or thyroid disease, prostate/bladder issues or glaucoma, you may also use Sudafed to treat nasal congestion.  It is highly recommended that you  consult with a pharmacist or your primary care physician to ensure this medication is safe for you to take.     If you have a cough, you may use cough suppressants such as Delsym and Robitussin.  If you have glaucoma or high blood pressure, you can also use Coricidin HBP.    If you have a sore or scratchy throat, use a saltwater gargle-  to  teaspoon of salt dissolved in a 4-ounce to 8-ounce glass of warm water.  Gargle the solution for approximately 15-30 seconds and then spit.  It is important not to swallow the solution.  You can also use throat lozenges/cough drops and Chloraseptic spray to help with throat pain or discomfort.  Warm or cold liquids can also be helpful in relieving throat pain.  For headache, pain or general discomfort, you can use Ibuprofen or Tylenol as directed.   Some authorities believe that zinc sprays or the use of Echinacea may shorten the course of your symptoms.   ANYONE WHO HAS FLU SYMPTOMS SHOULD: Stay home. The flu is highly contagious and going out or to work exposes others! Be sure to drink plenty of fluids. Water is fine as well as fruit juices, sodas and electrolyte beverages. You may want to stay away from caffeine or alcohol. If you are nauseated, try taking small sips of liquids. How do you know if you are getting enough fluid? Your urine should be a pale yellow or almost colorless. Get  rest. Taking a steamy shower or using a humidifier may help nasal congestion and ease sore throat pain. Using a saline nasal spray works much the same way. Cough drops, hard candies and sore throat lozenges may ease your cough. Line up a caregiver. Have someone check on you regularly.   GET HELP RIGHT AWAY IF: You cannot keep down liquids or your medications. You become short of breath Your fell like you are going to pass out or loose consciousness. Your symptoms persist after you have completed your treatment plan MAKE SURE YOU  Understand these instructions. Will  watch your condition. Will get help right away if you are not doing well or get worse.  Your e-visit answers were reviewed by a board certified advanced clinical practitioner to complete your personal care plan.  Depending on the condition, your plan could have included both over the counter or prescription medications.  If there is a problem please reply  once you have received a response from your provider.  Your safety is important to [place].  If you have drug allergies check your prescription carefully.    You can use MyChart to ask questions about todays visit, request a non-urgent call back, or ask for a work or school excuse for 24 hours related to this e-Visit. If it has been greater than 24 hours you will need to follow up with your provider, or enter a new e-Visit to address those concerns.  You will get an e-mail in the next two days asking about your experience.  I hope that your e-visit has been valuable and will speed your recovery. Thank you for using e-visits.

## 2021-03-13 ENCOUNTER — Encounter: Payer: Self-pay | Admitting: Nurse Practitioner

## 2021-03-15 ENCOUNTER — Other Ambulatory Visit: Payer: Self-pay | Admitting: Family Medicine

## 2021-03-15 ENCOUNTER — Telehealth: Payer: Self-pay

## 2021-03-15 MED ORDER — METOPROLOL SUCCINATE ER 50 MG PO TB24: ORAL_TABLET | ORAL | 0 refills | Status: DC

## 2021-03-15 NOTE — Telephone Encounter (Signed)
I have patient scheduled for 03/28/2021 @ 4pm and he wanted to make sure he was getting refills on his olmesartan too.

## 2021-03-15 NOTE — Telephone Encounter (Signed)
Please contact patient concerning rescheduling cancelled appt.   Sending 30 day BP medication refill. No additional refills without TOC appt.   Thanks

## 2021-03-16 MED ORDER — OLMESARTAN MEDOXOMIL 5 MG PO TABS: ORAL_TABLET | ORAL | 0 refills | Status: DC

## 2021-03-16 MED ORDER — DOXYCYCLINE HYCLATE 100 MG PO TABS: 100.0000 mg | ORAL_TABLET | Freq: Two times a day (BID) | ORAL | 0 refills | Status: DC

## 2021-03-16 NOTE — Addendum Note (Signed)
Addended by: Peggye Ley on: 03/16/2021 10:14 AM   Modules accepted: Orders

## 2021-03-16 NOTE — Addendum Note (Signed)
Addended by: Evelina Dun A on: 03/16/2021 09:45 AM   Modules accepted: Orders

## 2021-03-28 ENCOUNTER — Encounter: Payer: Self-pay | Admitting: Family Medicine

## 2021-03-28 ENCOUNTER — Other Ambulatory Visit: Payer: Self-pay

## 2021-03-28 ENCOUNTER — Ambulatory Visit: Payer: Managed Care, Other (non HMO) | Admitting: Family Medicine

## 2021-03-28 VITALS — BP 125/88 | HR 83 | Ht 72.0 in | Wt 272.0 lb

## 2021-03-28 DIAGNOSIS — D485 Neoplasm of uncertain behavior of skin: Secondary | ICD-10-CM

## 2021-03-28 DIAGNOSIS — Z1211 Encounter for screening for malignant neoplasm of colon: Secondary | ICD-10-CM | POA: Diagnosis not present

## 2021-03-28 DIAGNOSIS — I10 Essential (primary) hypertension: Secondary | ICD-10-CM

## 2021-03-28 MED ORDER — METOPROLOL SUCCINATE ER 50 MG PO TB24: ORAL_TABLET | ORAL | 3 refills | Status: DC

## 2021-03-28 MED ORDER — OLMESARTAN MEDOXOMIL 5 MG PO TABS: ORAL_TABLET | ORAL | 3 refills | Status: DC

## 2021-03-28 NOTE — Patient Instructions (Addendum)
Continue current medications.  See me again in 6 months.   We'll plan to update labs at this visit.

## 2021-03-31 ENCOUNTER — Encounter: Payer: Self-pay | Admitting: Family Medicine

## 2021-04-03 DIAGNOSIS — Z1331 Encounter for screening for depression: Secondary | ICD-10-CM | POA: Insufficient documentation

## 2021-04-03 DIAGNOSIS — Z1211 Encounter for screening for malignant neoplasm of colon: Secondary | ICD-10-CM | POA: Insufficient documentation

## 2021-04-03 DIAGNOSIS — D485 Neoplasm of uncertain behavior of skin: Secondary | ICD-10-CM | POA: Insufficient documentation

## 2021-04-03 NOTE — Assessment & Plan Note (Signed)
BP remains fairly well controlled.  Continue current medications.  Encouraged monitoring of BP at home.  Low sodium diet encouraged.

## 2021-04-03 NOTE — Progress Notes (Signed)
Brendan Neal - 56 y.o. male MRN [id number]  Date of birth: 1965-06-10  Subjective Chief Complaint  Patient presents with   Hypertension    HPI Brendan Neal is a 56 y.o. male here today for follow up of HTN.  Doing well with current medications of olmesartan and metoprolol.  Has not noted any side effects from these.  Denies chest pain, shortness of breath, palpitations, headache or vision changes.    He has concerns about skin lesion on the chest and would like referral to dermatology.    Also due for colon cancer screening.  Would like referral for colonoscopy.   ROS:  A comprehensive ROS was completed and negative except as noted per HPI  Allergies  Allergen Reactions   Singulair [Montelukast]     Feels weird    Sudafed [Pseudoephedrine]     Heart racing.     Past Medical History:  Diagnosis Date   Acute left lumbar radiculopathy 01/30/2019   Cervico-occipital neuralgia of left side 07/08/2015   Chest discomfort 03/01/2018   DVT (deep venous thrombosis) (HCC)    Environmental allergies 02/18/2019   Facial flushing 03/01/2018   GAD (generalized anxiety disorder) 02/17/2015   Genital warts    GERD (gastroesophageal reflux disease)    HTN (hypertension) 09/11/2014   Hypertension    Lumbar degenerative disc disease 01/30/2019   Nasal septal deviation 10/06/2016   Obese 09/23/2015   Panic attacks 02/20/2019   Seborrheic dermatitis 07/02/2015   Sinusitis, chronic 09/11/2014   Situational anxiety 09/11/2014   Snores 09/11/2014    Past Surgical History:  Procedure Laterality Date   TONSILLECTOMY     WISDOM TOOTH EXTRACTION      Social History   Socioeconomic History   Marital status: Married    Spouse name: Not on file   Number of children: Not on file   Years of education: Not on file   Highest education level: Not on file  Occupational History   Not on file  Tobacco Use   Smoking status: Never   Smokeless tobacco: Never  Vaping Use   Vaping Use: Never used   Substance and Sexual Activity   Alcohol use: No   Drug use: No   Sexual activity: Yes  Other Topics Concern   Not on file  Social History Narrative   Not on file   Social Determinants of Health   Financial Resource Strain: Not on file  Food Insecurity: Not on file  Transportation Needs: Not on file  Physical Activity: Not on file  Stress: Not on file  Social Connections: Not on file    Family History  Problem Relation Age of Onset   Alcohol abuse Brother    Depression Brother    Diabetes Maternal Aunt    High blood pressure Mother    Hypertension Mother    Hyperlipidemia Mother    Migraines Father    High blood pressure Father    Hypertension Father    Varicose Veins Father    High blood pressure Maternal Grandmother    High blood pressure Maternal Grandfather    High blood pressure Paternal Grandmother    Diabetes Paternal Grandfather    High blood pressure Paternal Grandfather     Health Maintenance  Topic Date Due   Hepatitis C Screening  Never done   Zoster Vaccines- Shingrix (1 of 2) Never done   COLONOSCOPY (Pts 45-40yrs Insurance coverage will need to be confirmed)  Never done   COVID-19 Vaccine (3 - Moderna  risk series) 08/26/2020   INFLUENZA VACCINE  Never done   TETANUS/TDAP  09/22/2025   HIV Screening  Completed   HPV VACCINES  Aged Out     ----------------------------------------------------------------------------------------------------------------------------------------------------------------------------------------------------------------- Physical Exam BP 125/88 (BP Location: Right Arm, Patient Position: Sitting, Cuff Size: Large)    Pulse 83    Ht 6' (1.829 m)    Wt 272 lb (123.4 kg)    SpO2 96%    BMI 36.89 kg/m   Physical Exam Constitutional:      Appearance: Normal appearance.  Eyes:     General: No scleral icterus. Cardiovascular:     Rate and Rhythm: Normal rate and regular rhythm.  Pulmonary:     Effort: Pulmonary effort is  normal.     Breath sounds: Normal breath sounds.  Musculoskeletal:     Cervical back: Neck supple.  Neurological:     General: No focal deficit present.     Mental Status: He is alert.  Psychiatric:        Mood and Affect: Mood normal.        Behavior: Behavior normal.    ------------------------------------------------------------------------------------------------------------------------------------------------------------------------------------------------------------------- Assessment and Plan  HTN (hypertension) BP remains fairly well controlled.  Continue current medications.  Encouraged monitoring of BP at home.  Low sodium diet encouraged.    Colon cancer screening Gi referral placed.   Neoplasm of uncertain behavior of skin Dermatology referral placed.    Meds ordered this encounter  Medications   metoprolol succinate (TOPROL-XL) 50 MG 24 hr tablet    Sig: TAKE 1 TABLET(50 MG) BY MOUTH DAILY WITH OR IMMEDIATELY FOLLOWING A MEAL    Dispense:  90 tablet    Refill:  3   olmesartan (BENICAR) 5 MG tablet    Sig: TAKE 1 TABLET(5 MG) BY MOUTH DAILY    Dispense:  90 tablet    Refill:  3    Return in about 6 months (around 09/25/2021) for HTN/Labs.    This visit occurred during the SARS-CoV-2 public health emergency.  Safety protocols were in place, including screening questions prior to the visit, additional usage of staff PPE, and extensive cleaning of exam room while observing appropriate contact time as indicated for disinfecting solutions.

## 2021-04-03 NOTE — Assessment & Plan Note (Signed)
Dermatology referral placed

## 2021-04-03 NOTE — Assessment & Plan Note (Signed)
Gi referral placed  

## 2021-04-10 ENCOUNTER — Other Ambulatory Visit: Payer: Self-pay | Admitting: Family Medicine

## 2021-05-26 ENCOUNTER — Other Ambulatory Visit: Payer: Self-pay | Admitting: Family Medicine

## 2021-06-26 ENCOUNTER — Encounter: Payer: Self-pay | Admitting: Family Medicine

## 2021-06-26 ENCOUNTER — Encounter (INDEPENDENT_AMBULATORY_CARE_PROVIDER_SITE_OTHER): Payer: Managed Care, Other (non HMO) | Admitting: Family Medicine

## 2021-06-26 DIAGNOSIS — Z1211 Encounter for screening for malignant neoplasm of colon: Secondary | ICD-10-CM

## 2021-06-26 DIAGNOSIS — F40243 Fear of flying: Secondary | ICD-10-CM | POA: Diagnosis not present

## 2021-06-27 MED ORDER — ALPRAZOLAM 0.5 MG PO TABS: ORAL_TABLET | ORAL | 0 refills | Status: DC

## 2021-06-27 NOTE — Progress Notes (Signed)
Please see the MyChart message reply(ies) for my assessment and plan.    This patient gave consent for this Medical Advice Message and is aware that it may result in a bill to their insurance company, as well as the possibility of receiving a bill for a co-payment or deductible. They are an established patient, but are not seeking medical advice exclusively about a problem treated during an in person or video visit in the last seven days. I did not recommend an in person or video visit within seven days of my reply.    I spent a total of 7 minutes cumulative time within 7 days through MyChart messaging.  Oz Gammel, DO   

## 2021-07-11 ENCOUNTER — Encounter: Payer: Self-pay | Admitting: Gastroenterology

## 2021-07-30 IMAGING — US US EXTREM LOW VENOUS*L*
1 series · 14 of 24 positions shown · non-contrast
Comparison: None.

CLINICAL DATA: Swelling and redness.  Pain.

EXAM:
LEFT LOWER EXTREMITY VENOUS DOPPLER ULTRASOUND
TECHNIQUE: Gray-scale sonography with compression, as well as color and duplex
ultrasound, were performed to evaluate the deep venous system(s)
from the level of the common femoral vein through the popliteal and
proximal calf veins.

[Series 1: us extrem low venous*left* · 0.09mm/px · 14 of 49 slices shown]
[im 1/49]
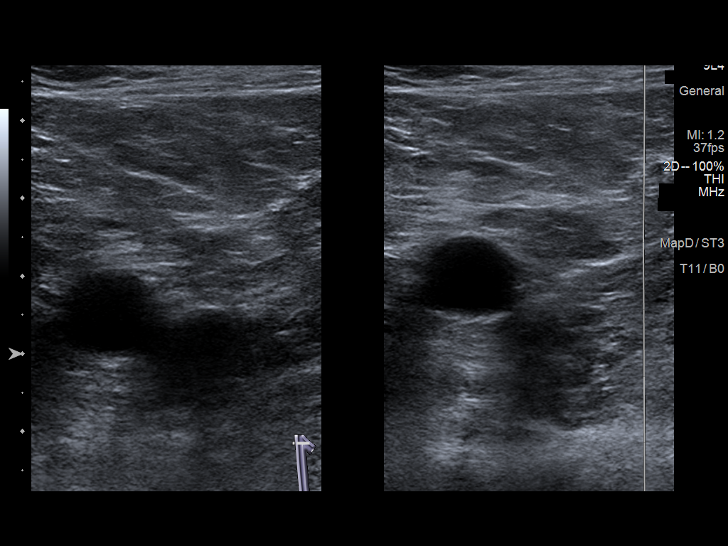
[im 5/49]
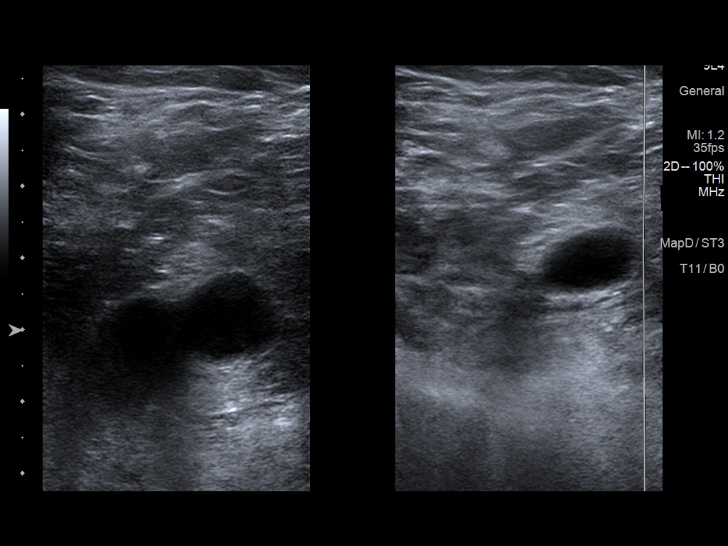
[im 9/49]
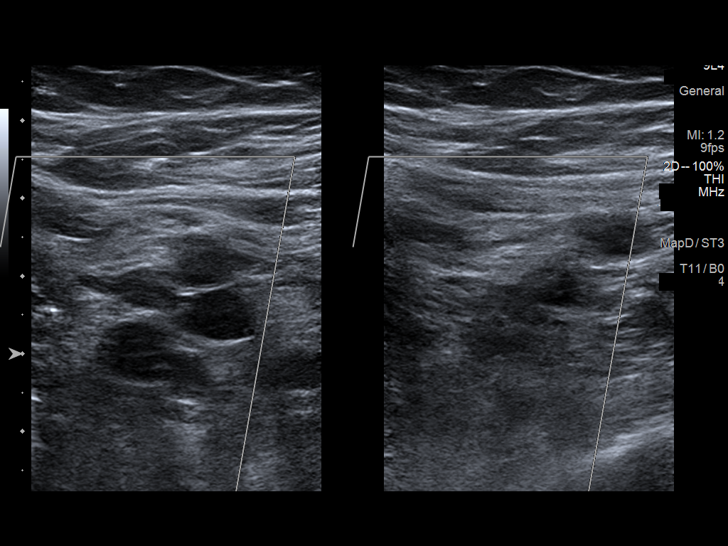
[im 13/49]
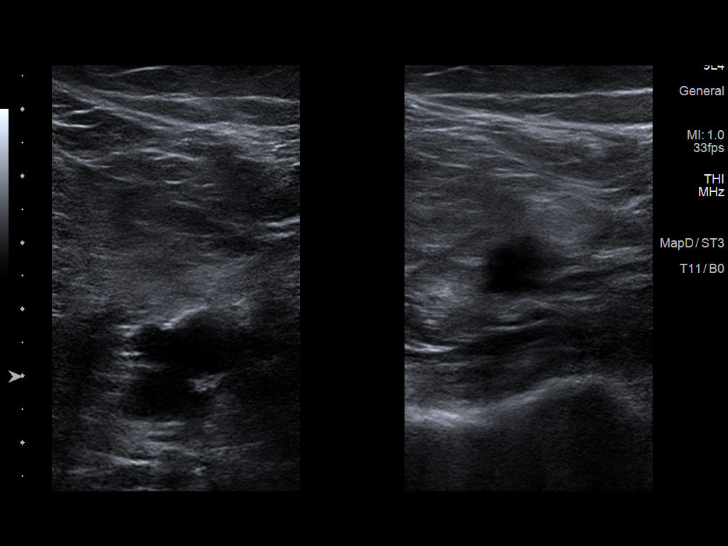
[im 15/49]
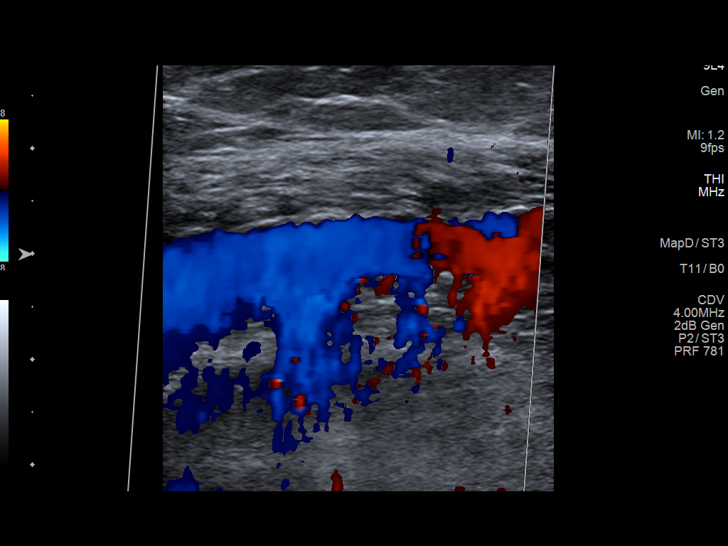
[im 19/49]
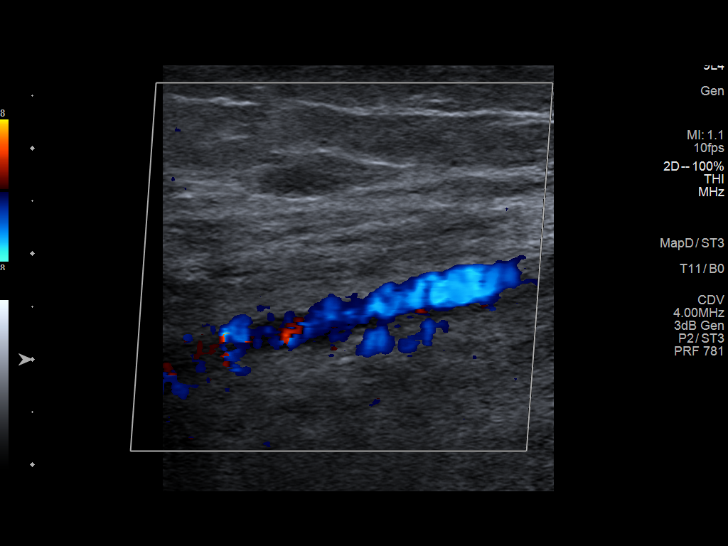
[im 23/49]
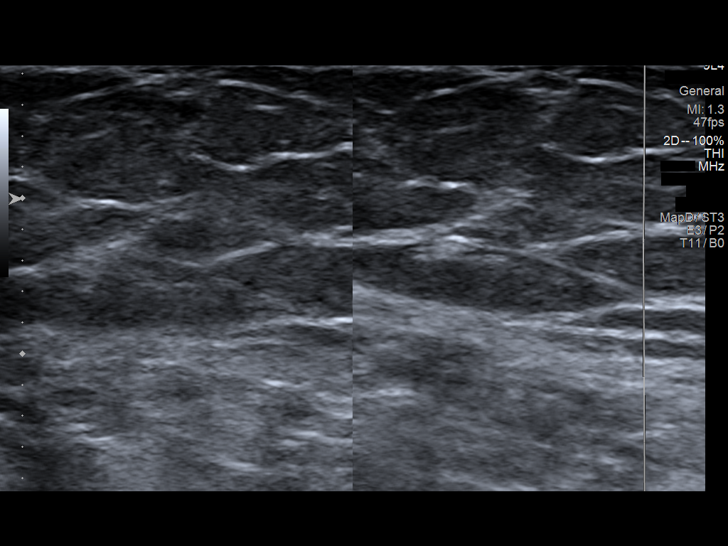
[im 26/49]
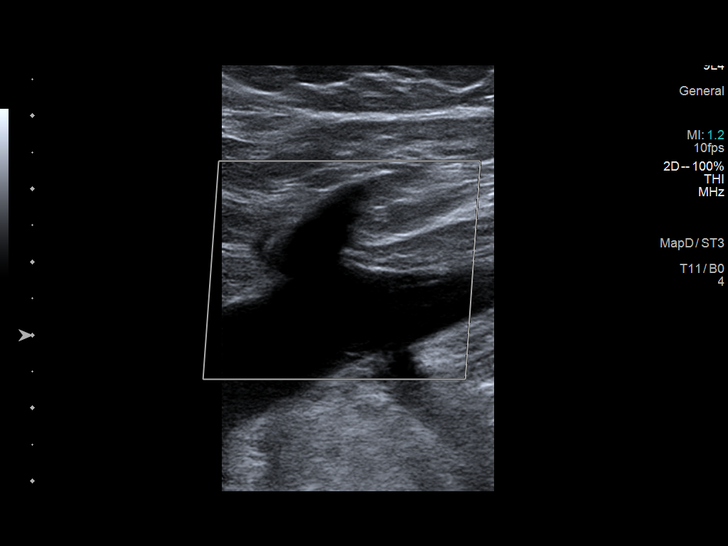
[im 30/49]
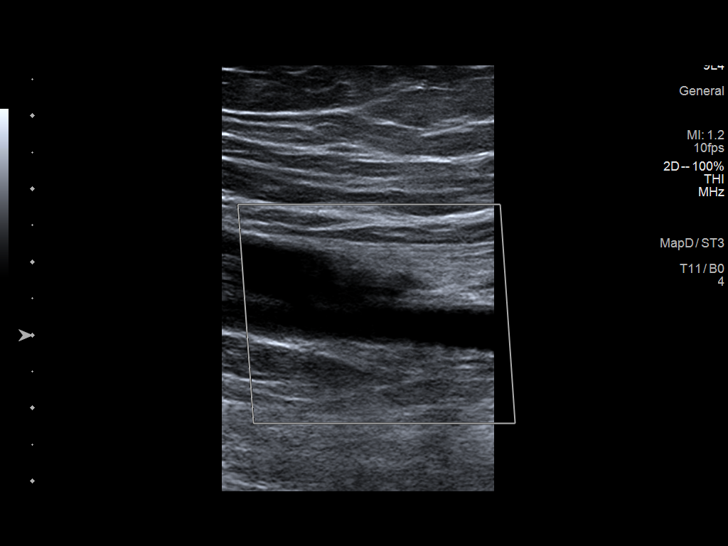
[im 34/49]
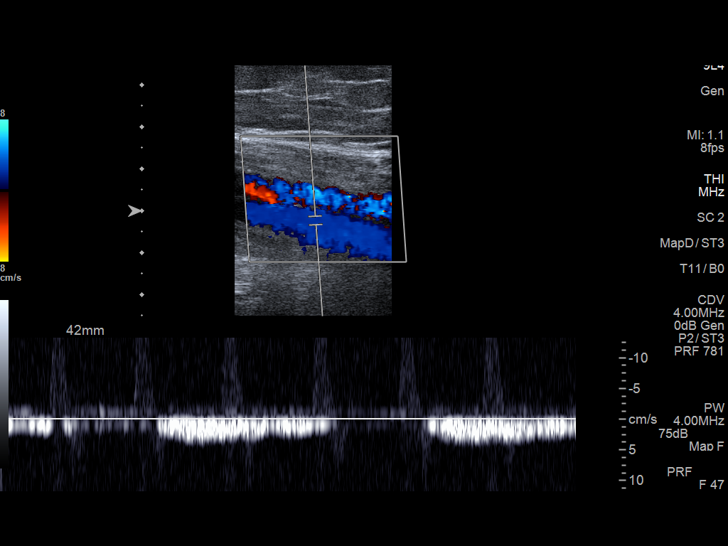
[im 38/49]
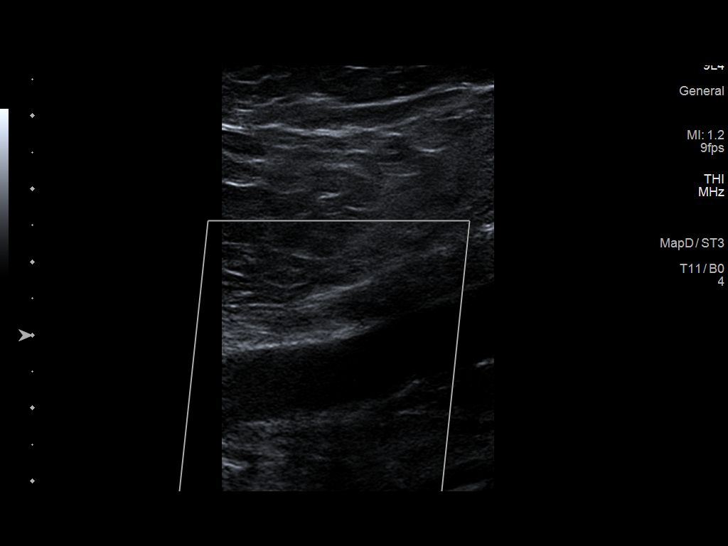
[im 40/49]
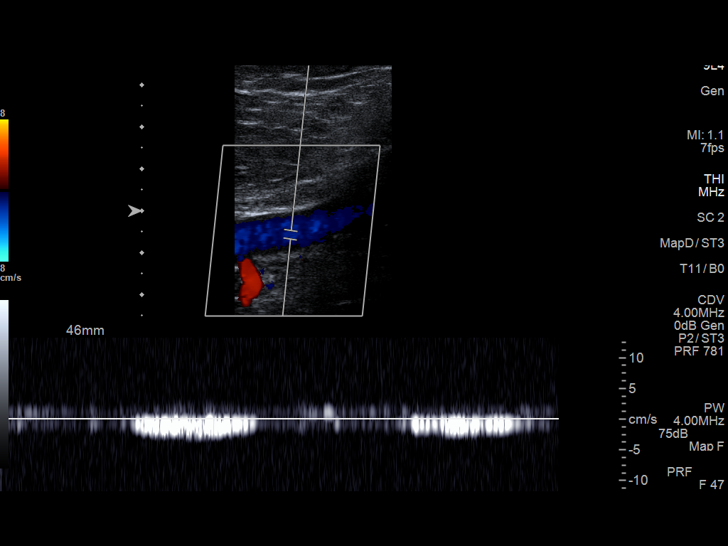
[im 44/49]
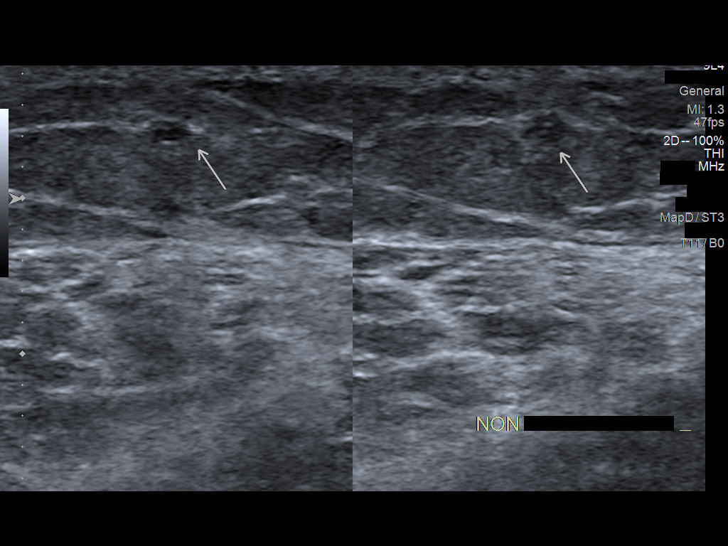
[im 49/49]
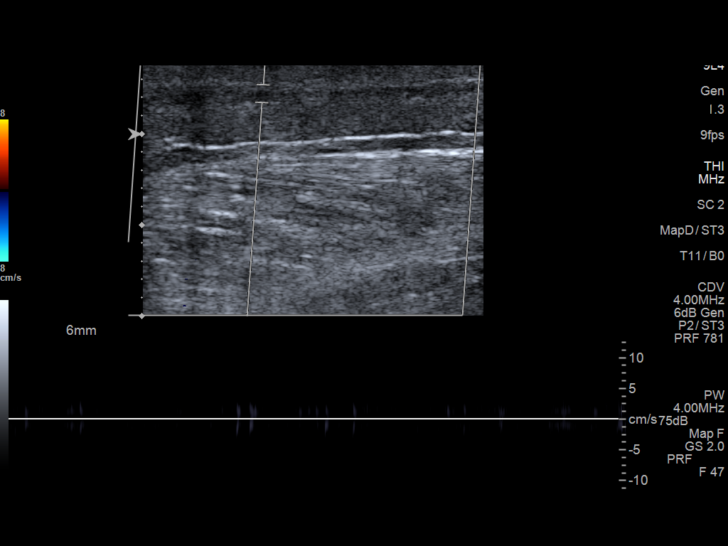

[14 of 24 positions shown; findings below may reference images not displayed]

FINDINGS: VENOUS

Normal compressibility of the common femoral, popliteal veins, as
well as the visualized calf veins. Visualized portions of profunda
femoral vein and great saphenous vein unremarkable. No filling
defects to suggest DVT on grayscale or color Doppler imaging.
Doppler waveforms show normal direction of venous flow, normal
respiratory phasicity and response to augmentation.

Limited views of the contralateral common femoral vein are
unremarkable.

Superficial thrombus in superficial veins about the calf. This is in
the region of patient's clinical symptoms.

OTHER

None.

Limitations: none
IMPRESSION: 1. No femoropopliteal DVT nor evidence of DVT within the visualized
calf veins.

2. Superficial thrombus noted in superficial veins of the calf in
the region clinical concern.

## 2021-08-18 ENCOUNTER — Encounter: Payer: Self-pay | Admitting: Family Medicine

## 2021-08-21 IMAGING — DX DG CERVICAL SPINE COMPLETE 4+V
6 series · 6 of 6 positions shown · non-contrast
Comparison: None.

CLINICAL DATA: Left neck pain radiating to the left shoulder this
morning. No reported injury.

EXAM:
CERVICAL SPINE - COMPLETE 4+ VIEW

[c-spine lat]
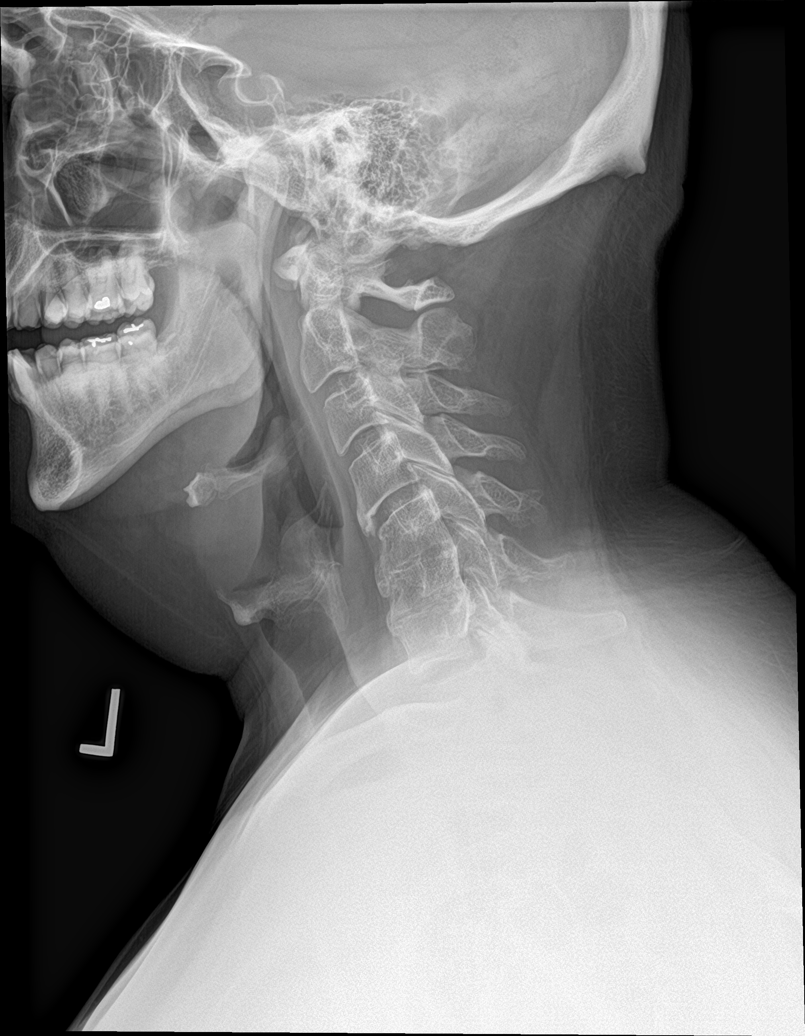

[c-spine obl (1 of 2)]
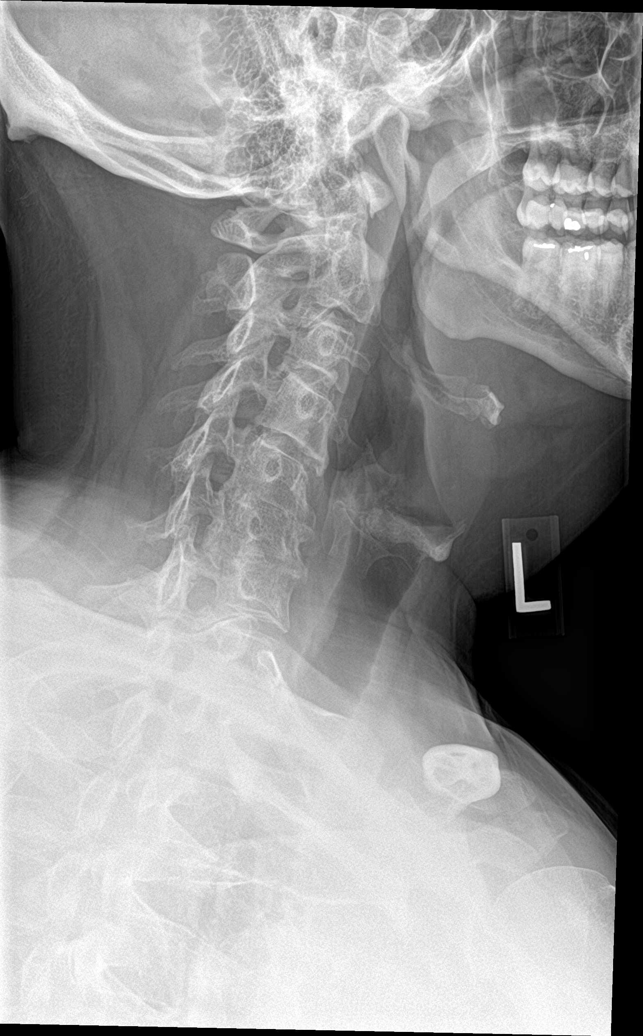

[c-spine obl (2 of 2)]
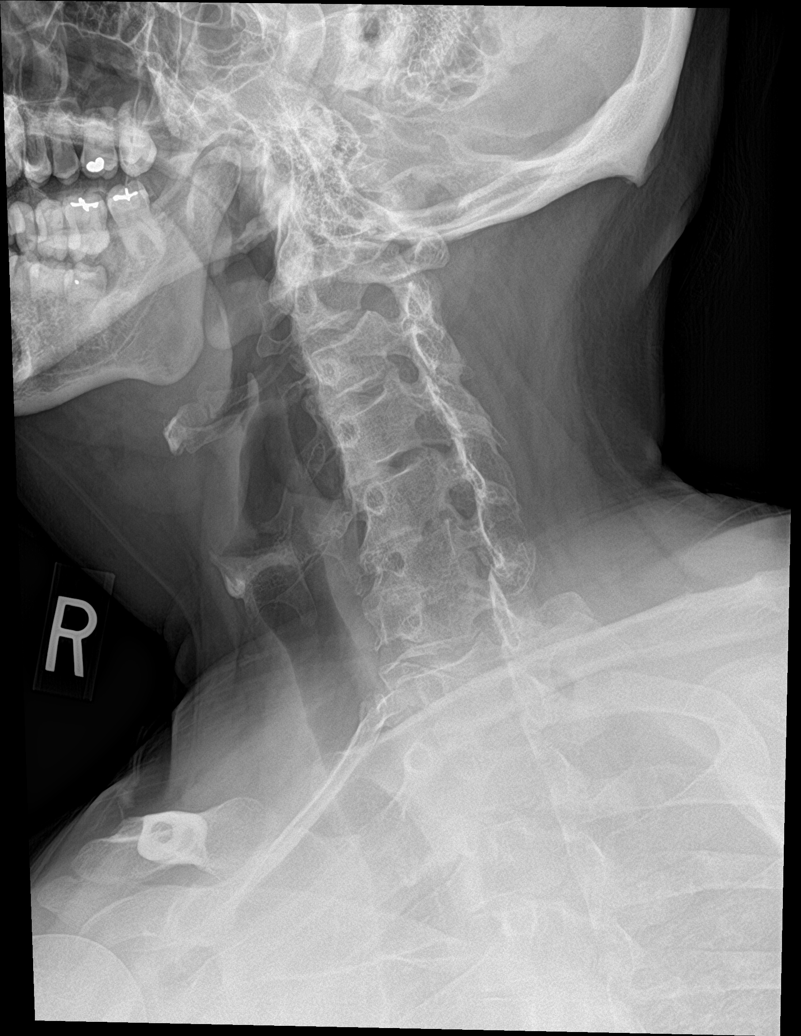

[c-spine ap]
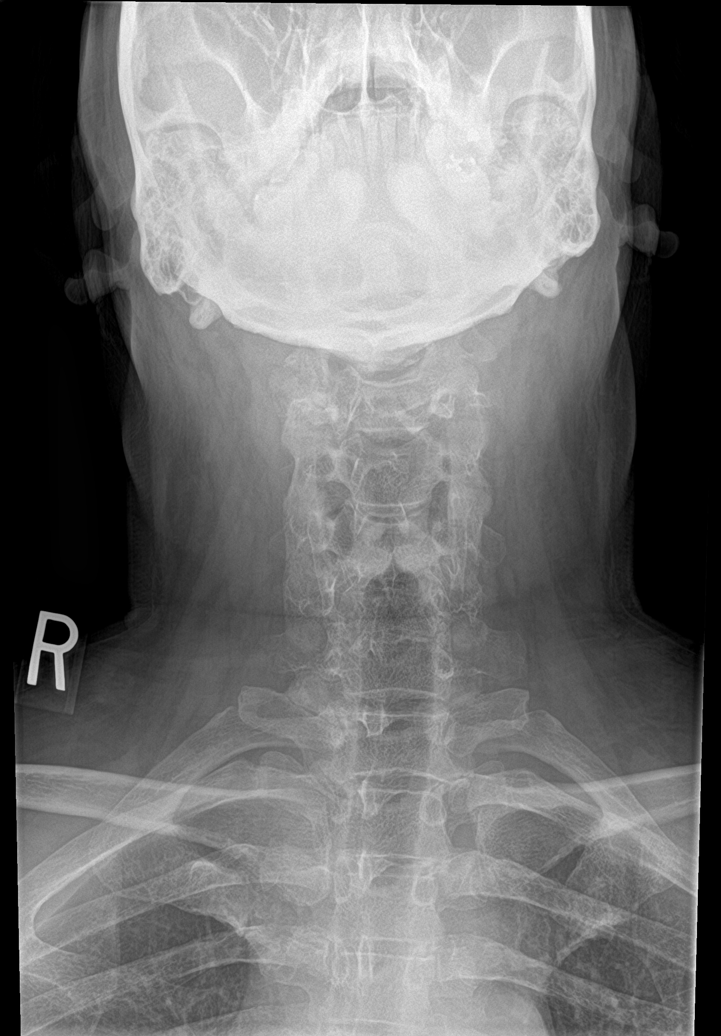

[c-spine open mouth]
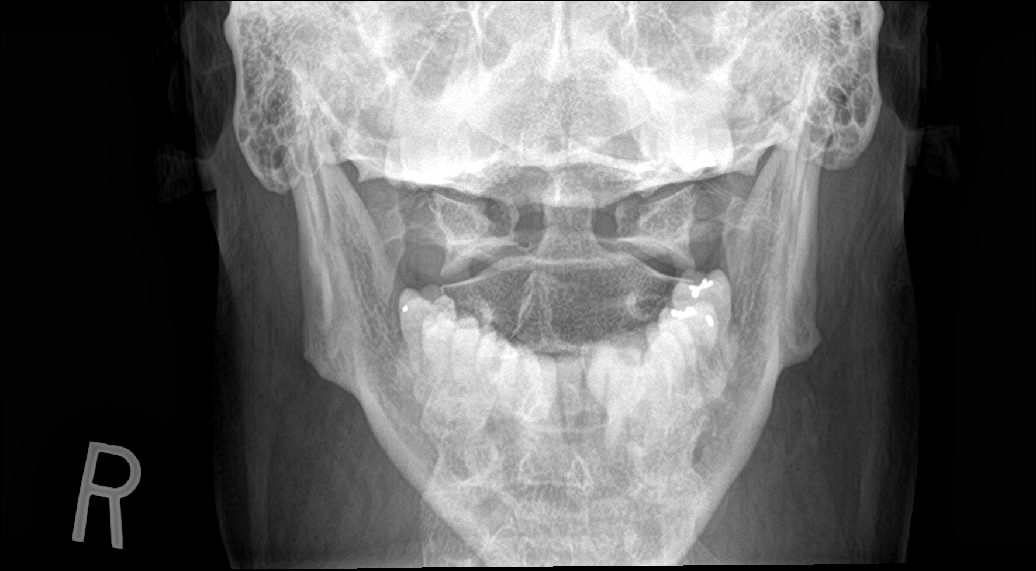

[c-spine swimmers]
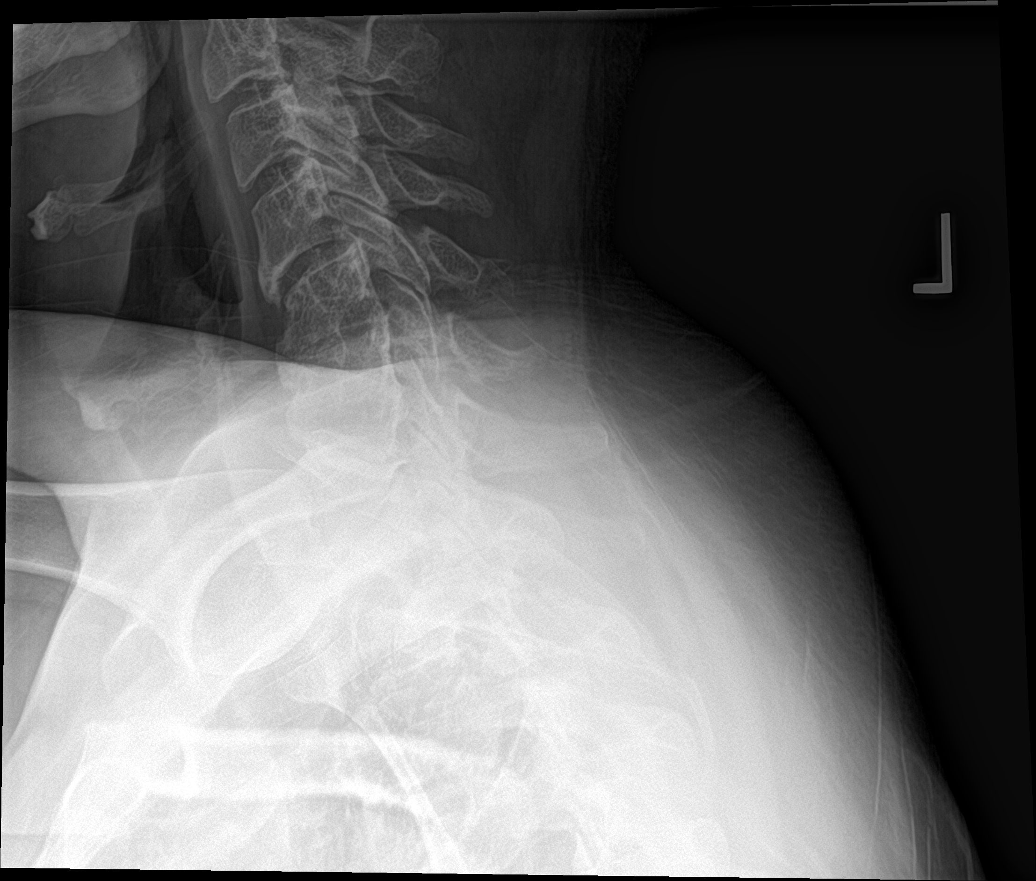

[6 of 6 positions shown; findings below may reference images not displayed]

FINDINGS: On the lateral view the cervical spine is visualized to the level of
C7-T1. Straightening of the cervical spine. Pre-vertebral soft
tissues are within normal limits. No fracture is detected in the
cervical spine. Dens is well positioned between the lateral masses
of C1. Moderate to marked degenerative disc disease in the mid to
lower cervical spine, most prominent at C5-6 and C6-7 with ankylosis
at these disc levels. No cervical spine subluxation. Mild bilateral
facet arthropathy. Mild degenerative foraminal stenosis bilaterally
at C6-7. No aggressive appearing focal osseous lesions.
IMPRESSION: 1. Straightening of the cervical spine, usually due to positioning
and/or muscle spasm.
2. Moderate to marked degenerative disc disease in the mid to lower
cervical spine, most prominent at C5-6 and C6-7 with ankylosis at
these disc level.
3. Mild degenerative foraminal stenosis bilaterally at C6-7.

## 2021-09-16 ENCOUNTER — Encounter: Payer: Managed Care, Other (non HMO) | Admitting: Gastroenterology

## 2021-10-10 ENCOUNTER — Ambulatory Visit: Payer: Managed Care, Other (non HMO) | Admitting: Family Medicine

## 2021-10-29 ENCOUNTER — Other Ambulatory Visit: Payer: Self-pay | Admitting: Family Medicine

## 2021-11-04 NOTE — Telephone Encounter (Signed)
Pls contact pt to schedule appt for HTN. Past due since August. Thanks

## 2021-11-07 NOTE — Telephone Encounter (Signed)
Called the patient to get them scheduled for a appointment for HTN. He stated that he was taking care of his parent in [place] and having a difficult time getting here to get to an appointment. He is now in [place] and will call back within 72 hours with a solution. - tvt

## 2021-11-16 ENCOUNTER — Ambulatory Visit
Admission: EM | Admit: 2021-11-16 | Discharge: 2021-11-16 | Disposition: A | Discharge status: Home / Self Care | Payer: Managed Care, Other (non HMO) | Attending: Physician Assistant | Admitting: Physician Assistant

## 2021-11-16 ENCOUNTER — Ambulatory Visit: Payer: Managed Care, Other (non HMO)

## 2021-11-16 DIAGNOSIS — J069 Acute upper respiratory infection, unspecified: Secondary | ICD-10-CM

## 2021-11-16 DIAGNOSIS — R0781 Pleurodynia: Secondary | ICD-10-CM | POA: Diagnosis not present

## 2021-11-16 DIAGNOSIS — R051 Acute cough: Secondary | ICD-10-CM

## 2021-11-16 DIAGNOSIS — S29011A Strain of muscle and tendon of front wall of thorax, initial encounter: Secondary | ICD-10-CM | POA: Diagnosis not present

## 2021-11-16 MED ORDER — PREDNISONE 20 MG PO TABS: 40.0000 mg | ORAL_TABLET | Freq: Every day | ORAL | 0 refills | Status: AC

## 2021-11-16 MED ORDER — BACLOFEN 10 MG PO TABS: 10.0000 mg | ORAL_TABLET | Freq: Every evening | ORAL | 0 refills | Status: DC | PRN

## 2021-11-16 MED ORDER — PROMETHAZINE-DM 6.25-15 MG/5ML PO SYRP: 5.0000 mL | ORAL_SOLUTION | Freq: Three times a day (TID) | ORAL | 0 refills | Status: DC | PRN

## 2021-11-16 NOTE — ED Triage Notes (Signed)
Pt complains of nasal congestion, headache, ear fullness and sore throat since Sunday. Last night pt coughed and felt sharp pain in abdomin states it hurts to move or do anything. Denies fever and has taken prescribed allergy medication

## 2021-11-16 NOTE — Discharge Instructions (Addendum)
Your x-ray did not show any evidence of rib fracture which is great news.  It did show that your lung is not completely inflated likely because of the pain.  It is important that you purposely take deep breaths in order to help with this.  I have called in prednisone to help with your symptoms.  Do not take NSAIDs including aspirin, ibuprofen/Advil, naproxen/Aleve with this medication.  If you have side effects with the prednisone you can take a little bit of Benadryl.  Be careful combining this with your Claritin.  You can use Tylenol/acetaminophen.  Take baclofen at night.  This will make you sleepy so do not drive or drink alcohol while taking it.  Use Promethazine DM for cough.  This will also make you sleepy so do not drive or drink alcohol with taking it.  If your symptoms not improving quickly please follow-up with your primary care.  If you have any worsening symptoms you need to be seen immediately.

## 2021-11-16 NOTE — ED Provider Notes (Signed)
[facility]    CSN: [id number] Arrival date & time: 11/16/21  0816      History   Chief Complaint Chief Complaint  Patient presents with   Abdominal Pain   Nasal Congestion   Cough   Sore Throat    HPI Trigger Frasier is a 56 y.o. male.   Patient presents today with a 5-day history of URI symptoms.  Reports nasal congestion, cough, sore throat.  Reports that last night he had a coughing episode that caused him to injure his rib cage/abdomen.  Since that time he has had significant pain particularly with movements or coughing/sneezing.  Pain is rated 9/10 on a 0-10 pain scale, described as sharp, no aggravating relieving factors identified.  He has tried aspirin over-the-counter without improvement of symptoms.  He did take COVID test twice when symptoms began and was negative.  He has taken Claritin which has provided minimal relief of symptoms.  Denies any recent antibiotic or steroids.  He does report that he has been having some GI upset for the past several weeks but attributes this to stress as he is now having to care for his elderly father who recently had to have a pacemaker.  He denies any chest pain or shortness of breath.    Past Medical History:  Diagnosis Date   Acute left lumbar radiculopathy 01/30/2019   Cervico-occipital neuralgia of left side 07/08/2015   Chest discomfort 03/01/2018   DVT (deep venous thrombosis) (HCC)    Environmental allergies 02/18/2019   Facial flushing 03/01/2018   GAD (generalized anxiety disorder) 02/17/2015   Genital warts    GERD (gastroesophageal reflux disease)    HTN (hypertension) 09/11/2014   Hypertension    Lumbar degenerative disc disease 01/30/2019   Nasal septal deviation 10/06/2016   Obese 09/23/2015   Panic attacks 02/20/2019   Seborrheic dermatitis 07/02/2015   Sinusitis, chronic 09/11/2014   Situational anxiety 09/11/2014   Snores 09/11/2014    Patient Active Problem List   Diagnosis Date Noted   Colon cancer  screening 04/03/2021   Neoplasm of uncertain behavior of skin 04/03/2021   Phlebitis 10/14/2019   Neuropathic pain of hand, right 10/14/2019   Left ankle pain 09/22/2019   Panic attacks 02/20/2019   Environmental allergies 02/18/2019   Lumbar degenerative disc disease 01/30/2019   Acute left lumbar radiculopathy 01/30/2019   Facial flushing 03/01/2018   Chest discomfort 03/01/2018   Nasal septal deviation 10/06/2016   Obese 09/23/2015   Cervico-occipital neuralgia of left side 07/08/2015   Seborrheic dermatitis 07/02/2015   GAD (generalized anxiety disorder) 02/17/2015   HTN (hypertension) 09/11/2014   Snores 09/11/2014   Situational anxiety 09/11/2014   Sinusitis, chronic 09/11/2014   GERD (gastroesophageal reflux disease) 09/11/2014    Past Surgical History:  Procedure Laterality Date   TONSILLECTOMY     WISDOM TOOTH EXTRACTION         Home Medications    Prior to Admission medications   Medication Sig Start Date End Date Taking? Authorizing Provider  baclofen (LIORESAL) 10 MG tablet Take 1 tablet (10 mg total) by mouth at bedtime as needed for muscle spasms. 11/16/21  Yes Lewi Drost K, PA-C  predniSONE (DELTASONE) 20 MG tablet Take 2 tablets (40 mg total) by mouth daily for 4 days. 11/16/21 11/20/21 Yes Takeya Marquis K, PA-C  promethazine-dextromethorphan (PROMETHAZINE-DM) 6.25-15 MG/5ML syrup Take 5 mLs by mouth 3 (three) times daily as needed for cough. 11/16/21  Yes Gwendolyn Nishi, Derry Skill, PA-C  ALPRAZolam Duanne Moron)  0.5 MG tablet Take 1 hour prior to flight.  May repeat if needing during flight. 06/27/21   Luetta Nutting, DO  aspirin EC 81 MG tablet Take 81 mg by mouth daily. Swallow whole.    [provider]  Calcium Carbonate Antacid (TUMS PO) Take by mouth daily as needed.     [provider]  fluticasone (FLONASE) 50 MCG/ACT nasal spray Place 1 spray into both nostrils daily as needed for allergies or rhinitis.    [provider]  hydrOXYzine  (ATARAX/VISTARIL) 25 MG tablet TAKE 1 TABLET(25 MG) BY MOUTH EVERY 8 HOURS AS NEEDED FOR ANXIETY 02/05/20   Emeterio Reeve, DO  loratadine (CLARITIN) 10 MG tablet Take 10 mg by mouth daily as needed.     [provider]  metoprolol succinate (TOPROL-XL) 50 MG 24 hr tablet TAKE 1 TABLET(50 MG) BY MOUTH DAILY WITH OR IMMEDIATELY FOLLOWING A MEAL 03/28/21   Luetta Nutting, DO  Multiple Vitamin (MULTIVITAMIN) tablet Take 1 tablet by mouth daily.    [provider]  olmesartan (BENICAR) 5 MG tablet TAKE 1 TABLET(5 MG) BY MOUTH DAILY 03/28/21   Luetta Nutting, DO  rizatriptan (MAXALT-MLT) 5 MG disintegrating tablet DISSOLVE 1 TABLET ON THE TONGUE AT ONSET OF HEADACHE. MAY REPEAT IN 2 HOURS AS NEEDED OR AS DIRECTED 11/04/21   Luetta Nutting, DO  Simethicone (GAS-X PO) Take by mouth as needed.    [provider]  sodium chloride (OCEAN) 0.65 % SOLN nasal spray Place 1 spray into both nostrils as needed for congestion.    [provider]    Family History Family History  Problem Relation Age of Onset   Alcohol abuse Brother    Depression Brother    Diabetes Maternal Aunt    High blood pressure Mother    Hypertension Mother    Hyperlipidemia Mother    Migraines Father    High blood pressure Father    Hypertension Father    Varicose Veins Father    High blood pressure Maternal Grandmother    High blood pressure Maternal Grandfather    High blood pressure Paternal Grandmother    Diabetes Paternal Grandfather    High blood pressure Paternal Grandfather     Social History Social History   Tobacco Use   Smoking status: Never   Smokeless tobacco: Never  Vaping Use   Vaping Use: Never used  Substance Use Topics   Alcohol use: No   Drug use: No     Allergies   Singulair [montelukast] and Sudafed [pseudoephedrine]   Review of Systems Review of Systems  Constitutional:  Positive for activity change. Negative for appetite change, fatigue and fever.   HENT:  Positive for congestion, postnasal drip and sinus pressure. Negative for sneezing and sore throat.   Respiratory:  Positive for cough. Negative for shortness of breath.   Cardiovascular:  Negative for chest pain.  Gastrointestinal:  Positive for abdominal pain. Negative for diarrhea, nausea and vomiting.  Neurological:  Negative for dizziness, light-headedness and headaches.     Physical Exam Triage Vital Signs ED Triage Vitals  Enc Vitals Group     BP 11/16/21 0841 (!) 131/97     Pulse Rate 11/16/21 0841 (!) 104     Resp 11/16/21 0841 20     Temp 11/16/21 0841 98.7 F (37.1 C)     Temp Source 11/16/21 0841 Oral     SpO2 11/16/21 0841 97 %     Weight --  Height --      Head Circumference --      Peak Flow --      Pain Score 11/16/21 0842 8     Pain Loc --      Pain Edu? --      Excl. in [place]? --    No data found.  Updated Vital Signs BP (!) 131/97 (BP Location: Left Arm)   Pulse (!) 104   Temp 98.7 F (37.1 C) (Oral)   Resp 20   SpO2 97%   Visual Acuity Right Eye Distance:   Left Eye Distance:   Bilateral Distance:    Right Eye Near:   Left Eye Near:    Bilateral Near:     Physical Exam Vitals reviewed.  Constitutional:      General: He is awake.     Appearance: Normal appearance. He is well-developed. He is not ill-appearing.     Comments: Very pleasant male appears stated age no acute distress sitting comfortably in exam room  HENT:     Head: Normocephalic and atraumatic.     Right Ear: Ear canal and external ear normal. A middle ear effusion is present. Tympanic membrane is not erythematous or bulging.     Left Ear: Ear canal and external ear normal. A middle ear effusion is present. Tympanic membrane is not erythematous or bulging.     Nose: Nose normal.     Mouth/Throat:     Pharynx: Uvula midline. No oropharyngeal exudate or posterior oropharyngeal erythema.  Cardiovascular:     Rate and Rhythm: Normal rate and regular rhythm.     Heart  sounds: Normal heart sounds, S1 normal and S2 normal. No murmur heard. Pulmonary:     Effort: Pulmonary effort is normal. No accessory muscle usage or respiratory distress.     Breath sounds: Normal breath sounds. No stridor. No wheezing, rhonchi or rales.     Comments: Clear to auscultation bilaterally Chest:     Chest wall: Tenderness present. No deformity or swelling.  Abdominal:     General: Bowel sounds are normal.     Palpations: Abdomen is soft.     Tenderness: There is no abdominal tenderness. There is no right CVA tenderness, left CVA tenderness, guarding or rebound.    Neurological:     Mental Status: He is alert.  Psychiatric:        Behavior: Behavior is cooperative.      UC Treatments / Results  Labs (all labs ordered are listed, but only abnormal results are displayed) Labs Reviewed - No data to display  EKG   Radiology DG Ribs Unilateral W/Chest Left  Result Date: 11/16/2021 CLINICAL DATA:  Pain over ribcage. EXAM: LEFT RIBS AND CHEST - 3+ VIEW COMPARISON:  Chest radiograph 04/29/2017 FINDINGS: Chest radiograph demonstrates small linear densities in left lower lung could that could represent atelectasis. Heart size is within normal limits and stable. Negative for a pneumothorax. Trachea is midline. Marker was placed in the left lower chest. No evidence for displaced rib fracture. IMPRESSION: 1. No evidence for displaced rib fracture. 2. Small linear densities in left lower lung could represent atelectasis. Electronically Signed   By: Markus Daft M.D.   On: 11/16/2021 09:17    Procedures Procedures (including critical care time)  Medications Ordered in UC Medications - No data to display  Initial Impression / Assessment and Plan / UC Course  I have reviewed the triage vital signs and the nursing notes.  Pertinent labs &  imaging results that were available during my care of the patient were reviewed by me and considered in my medical decision making (see chart  for details).     Strongly suspect viral etiology given clinical presentation.  Patient did ask about an antibiotic particularly given color of his sputum but discussed that this is not a reliable indication of infection and since he has only had symptoms for 5 days I do not feel there is any reason to start an antibiotic.  X-ray was obtained given tenderness over rib cage which showed no evidence of fracture but did show some atelectasis.  Discussed that he should focus on deep breathing exercises particularly ones rib pain has improved.  We will treat with a muscle relaxer and he was instructed not to drive or drink alcohol with taking this medication.  He was also started on a prednisone burst of 40 mg for 4 days with instruction not to take NSAIDs with this medication.  Discussed that if he has any significant side effects he can take a small dose of Benadryl to help manage this.  He was prescribed Promethazine DM for cough.  Discussed that this can be sedating and he is not to drive drink alcohol while taking it.  Recommended that he rest and drink plenty of fluid.  He was provided a work excuse note.  Discussed that if symptoms or not improving he should follow-up for reevaluation.  If he has any worsening symptoms he needs to be seen immediately.  Strict return precautions given.  Work excuse note provided.  Final Clinical Impressions(s) / UC Diagnoses   Final diagnoses:  Upper respiratory tract infection, unspecified type  Acute cough  Muscle strain of chest wall, initial encounter     Discharge Instructions      Your x-ray did not show any evidence of rib fracture which is great news.  It did show that your lung is not completely inflated likely because of the pain.  It is important that you purposely take deep breaths in order to help with this.  I have called in prednisone to help with your symptoms.  Do not take NSAIDs including aspirin, ibuprofen/Advil, naproxen/Aleve with this  medication.  If you have side effects with the prednisone you can take a little bit of Benadryl.  Be careful combining this with your Claritin.  You can use Tylenol/acetaminophen.  Take baclofen at night.  This will make you sleepy so do not drive or drink alcohol while taking it.  Use Promethazine DM for cough.  This will also make you sleepy so do not drive or drink alcohol with taking it.  If your symptoms not improving quickly please follow-up with your primary care.  If you have any worsening symptoms you need to be seen immediately.     ED Prescriptions     Medication Sig Dispense Auth. Provider   promethazine-dextromethorphan (PROMETHAZINE-DM) 6.25-15 MG/5ML syrup Take 5 mLs by mouth 3 (three) times daily as needed for cough. 118 mL Riley Hallum K, PA-C   predniSONE (DELTASONE) 20 MG tablet Take 2 tablets (40 mg total) by mouth daily for 4 days. 8 tablet Deandrew Hoecker K, PA-C   baclofen (LIORESAL) 10 MG tablet Take 1 tablet (10 mg total) by mouth at bedtime as needed for muscle spasms. 10 each Maren Wiesen, Derry Skill, PA-C      PDMP not reviewed this encounter.   Terrilee Croak, PA-C 11/16/21 [id number]

## 2021-11-17 ENCOUNTER — Telehealth: Payer: Self-pay

## 2021-11-17 NOTE — Telephone Encounter (Signed)
Called pt to check on status since UC visit. States hes feeling a little better today. Advised to call back if he has any questions or concerns.

## 2021-11-23 ENCOUNTER — Ambulatory Visit: Payer: Managed Care, Other (non HMO) | Admitting: Sports Medicine

## 2021-11-23 ENCOUNTER — Encounter: Payer: Self-pay | Admitting: Sports Medicine

## 2021-11-23 DIAGNOSIS — M940 Chondrocostal junction syndrome [Tietze]: Secondary | ICD-10-CM | POA: Diagnosis not present

## 2021-11-23 NOTE — Assessment & Plan Note (Signed)
This is a pleasant 56 year old male, he recently had an upper respiratory infection, was seen in urgent care, had a strong cough and ended up with some pain left lower costal margin. Popping sensations. X-rays were negative, on exam he still has mild tenderness left costal margin, I think what he is feeling is either a costochondral injury or one of his floating ribs. I think all that is needed here is going to be a rib belt, he will wear this for 2 weeks, return to see me in 4 weeks as needed.

## 2021-11-23 NOTE — Progress Notes (Signed)
    Procedures performed today:    Thorax was strapped with compressive dressing  Independent interpretation of notes and tests performed by another provider:   None.  Brief History, Exam, Impression, and Recommendations:    Slipping rib syndrome This is a pleasant 56 year old male, he recently had an upper respiratory infection, was seen in urgent care, had a strong cough and ended up with some pain left lower costal margin. Popping sensations. X-rays were negative, on exam he still has mild tenderness left costal margin, I think what he is feeling is either a costochondral injury or one of his floating ribs. I think all that is needed here is going to be a rib belt, he will wear this for 2 weeks, return to see me in 4 weeks as needed.    ____________________________________________ Gwen Her. Dianah Field, M.D., ABFM., CAQSM., AME. Primary Care and Sports Medicine [facility]  Adjunct Professor of [employer]  Risk manager

## 2021-12-01 ENCOUNTER — Ambulatory Visit: Payer: Managed Care, Other (non HMO)

## 2021-12-01 ENCOUNTER — Ambulatory Visit
Admission: EM | Admit: 2021-12-01 | Discharge: 2021-12-01 | Disposition: A | Discharge status: Home / Self Care | Payer: Managed Care, Other (non HMO) | Attending: Family Medicine | Admitting: Family Medicine

## 2021-12-01 ENCOUNTER — Encounter: Payer: Self-pay | Admitting: Emergency Medicine

## 2021-12-01 DIAGNOSIS — M5432 Sciatica, left side: Secondary | ICD-10-CM

## 2021-12-01 MED ORDER — METHOCARBAMOL 500 MG PO TABS: 500.0000 mg | ORAL_TABLET | Freq: Three times a day (TID) | ORAL | 0 refills | Status: DC | PRN

## 2021-12-01 MED ORDER — PREDNISONE 10 MG (21) PO TBPK: ORAL_TABLET | Freq: Every day | ORAL | 0 refills | Status: DC

## 2021-12-01 NOTE — Discharge Instructions (Addendum)
Advised/informed patient of lower back x-ray results with hard copy provided to patient.  Advised patient to take medication as directed with food to completion.  Advised patient may take Robaxin daily for accompanying muscle spasms of lower back.  Encouraged patient to increase daily water intake to 64 ounces per day while taking these medications.  Advised patient if symptoms worsen and/or unresolved please follow-up with PCP or or here for further evaluation.

## 2021-12-01 NOTE — ED Triage Notes (Signed)
Pt c/o low back pain on left side that radiates down leg. started suddenly yesterday. States he is unable to bend and move in certain directions due to pain. Denies known injury. Took tylenol with no relief.

## 2021-12-01 NOTE — ED Provider Notes (Signed)
Brendan Neal CARE    CSN: [id number] Arrival date & time: 12/01/21  2094      History   Chief Complaint Chief Complaint  Patient presents with   Back Pain    HPI Brendan Neal is a 56 y.o. male.   HPI 56 year old male presents with low back pain on left side that radiates down the leg started suddenly yesterday.  Patient reports that it is hard to sit and currently rates left-sided low back pain as 8/10.  Patient states he is unable to bend and move in certain directions due to pain.  Denies any known injury.  Reports taking OTC Tylenol with little to no relief.  PMH significant for morbid obesity, acute left lumbar radiculopathy, previous history of DVT, and HTN.  Past Medical History:  Diagnosis Date   Acute left lumbar radiculopathy 01/30/2019   Cervico-occipital neuralgia of left side 07/08/2015   Chest discomfort 03/01/2018   DVT (deep venous thrombosis) (HCC)    Environmental allergies 02/18/2019   Facial flushing 03/01/2018   GAD (generalized anxiety disorder) 02/17/2015   Genital warts    GERD (gastroesophageal reflux disease)    HTN (hypertension) 09/11/2014   Hypertension    Lumbar degenerative disc disease 01/30/2019   Nasal septal deviation 10/06/2016   Obese 09/23/2015   Panic attacks 02/20/2019   Seborrheic dermatitis 07/02/2015   Sinusitis, chronic 09/11/2014   Situational anxiety 09/11/2014   Snores 09/11/2014    Patient Active Problem List   Diagnosis Date Noted   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   Colon cancer screening 04/03/2021   Neoplasm of uncertain behavior of skin 04/03/2021   Phlebitis 10/14/2019   Neuropathic pain of hand, right 10/14/2019   Left ankle pain 09/22/2019   Panic attacks 02/20/2019   Environmental allergies 02/18/2019   Lumbar degenerative disc disease 01/30/2019   Acute left lumbar radiculopathy 01/30/2019   Facial flushing 03/01/2018   Chest discomfort 03/01/2018   Nasal septal deviation 10/06/2016   Obese 09/23/2015    Cervico-occipital neuralgia of left side 07/08/2015   Seborrheic dermatitis 07/02/2015   GAD (generalized anxiety disorder) 02/17/2015   HTN (hypertension) 09/11/2014   Snores 09/11/2014   Situational anxiety 09/11/2014   Sinusitis, chronic 09/11/2014   GERD (gastroesophageal reflux disease) 09/11/2014    Past Surgical History:  Procedure Laterality Date   TONSILLECTOMY     WISDOM TOOTH EXTRACTION         Home Medications    Prior to Admission medications   Medication Sig Start Date End Date Taking? Authorizing Provider  methocarbamol (ROBAXIN) 500 MG tablet Take 1 tablet (500 mg total) by mouth 3 (three) times daily as needed. 12/01/21  Yes Eliezer Lofts, FNP  predniSONE (STERAPRED UNI-PAK 21 TAB) 10 MG (21) TBPK tablet Take by mouth daily. Take 6 tabs by mouth daily  for 2 days, then 5 tabs for 2 days, then 4 tabs for 2 days, then 3 tabs for 2 days, 2 tabs for 2 days, then 1 tab by mouth daily for 2 days 12/01/21  Yes Eliezer Lofts, FNP  ALPRAZolam Duanne Moron) 0.5 MG tablet Take 1 hour prior to flight.  May repeat if needing during flight. 06/27/21   Luetta Nutting, DO  aspirin EC 81 MG tablet Take 81 mg by mouth daily. Swallow whole.    [provider]  Calcium Carbonate Antacid (TUMS PO) Take by mouth daily as needed.     [provider]  fluticasone (FLONASE) 50 MCG/ACT nasal spray Place 1 spray into  both nostrils daily as needed for allergies or rhinitis.    [provider]  hydrOXYzine (ATARAX/VISTARIL) 25 MG tablet TAKE 1 TABLET(25 MG) BY MOUTH EVERY 8 HOURS AS NEEDED FOR ANXIETY 02/05/20   Emeterio Reeve, DO  loratadine (CLARITIN) 10 MG tablet Take 10 mg by mouth daily as needed.     [provider]  metoprolol succinate (TOPROL-XL) 50 MG 24 hr tablet TAKE 1 TABLET(50 MG) BY MOUTH DAILY WITH OR IMMEDIATELY FOLLOWING A MEAL 03/28/21   Luetta Nutting, DO  Multiple Vitamin (MULTIVITAMIN) tablet Take 1 tablet by mouth daily.    [provider]  olmesartan (BENICAR) 5 MG tablet TAKE 1 TABLET(5 MG) BY MOUTH DAILY 03/28/21   Luetta Nutting, DO  promethazine-dextromethorphan (PROMETHAZINE-DM) 6.25-15 MG/5ML syrup Take 5 mLs by mouth 3 (three) times daily as needed for cough. 11/16/21   Raspet, Junie Panning K, PA-C  rizatriptan (MAXALT-MLT) 5 MG disintegrating tablet DISSOLVE 1 TABLET ON THE TONGUE AT ONSET OF HEADACHE. MAY REPEAT IN 2 HOURS AS NEEDED OR AS DIRECTED 11/04/21   Luetta Nutting, DO  Simethicone (GAS-X PO) Take by mouth as needed.    [provider]  sodium chloride (OCEAN) 0.65 % SOLN nasal spray Place 1 spray into both nostrils as needed for congestion.    [provider]    Family History Family History  Problem Relation Age of Onset   Alcohol abuse Brother    Depression Brother    Diabetes Maternal Aunt    High blood pressure Mother    Hypertension Mother    Hyperlipidemia Mother    Migraines Father    High blood pressure Father    Hypertension Father    Varicose Veins Father    High blood pressure Maternal Grandmother    High blood pressure Maternal Grandfather    High blood pressure Paternal Grandmother    Diabetes Paternal Grandfather    High blood pressure Paternal Grandfather     Social History Social History   Tobacco Use   Smoking status: Never   Smokeless tobacco: Never  Vaping Use   Vaping Use: Never used  Substance Use Topics   Alcohol use: No   Drug use: No     Allergies   Singulair [montelukast] and Sudafed [pseudoephedrine]   Review of Systems Review of Systems  Musculoskeletal:  Positive for back pain.  All other systems reviewed and are negative.    Physical Exam Triage Vital Signs ED Triage Vitals  Enc Vitals Group     BP 12/01/21 0855 123/85     Pulse Rate 12/01/21 0855 72     Resp 12/01/21 0855 16     Temp 12/01/21 0855 98.4 F (36.9 C)     Temp Source 12/01/21 0855 Oral     SpO2 12/01/21 0855 98 %     Weight --      Height --      Head  Circumference --      Peak Flow --      Pain Score 12/01/21 0857 3     Pain Loc --      Pain Edu? --      Excl. in [place]? --    No data found.  Updated Vital Signs BP 123/85 (BP Location: Right Arm)   Pulse 72   Temp 98.4 F (36.9 C) (Oral)   Resp 16   SpO2 98%    Physical Exam Vitals and nursing note reviewed.  Constitutional:      General: He  is not in acute distress.    Appearance: Normal appearance. He is obese. He is not ill-appearing.  HENT:     Head: Normocephalic and atraumatic.     Mouth/Throat:     Mouth: Mucous membranes are moist.     Pharynx: Oropharynx is clear.  Eyes:     Extraocular Movements: Extraocular movements intact.     Conjunctiva/sclera: Conjunctivae normal.     Pupils: Pupils are equal, round, and reactive to light.  Cardiovascular:     Rate and Rhythm: Normal rate and regular rhythm.     Pulses: Normal pulses.     Heart sounds: Normal heart sounds.  Pulmonary:     Effort: Pulmonary effort is normal.     Breath sounds: Normal breath sounds. No wheezing, rhonchi or rales.  Musculoskeletal:        General: Normal range of motion.     Cervical back: Normal range of motion and neck supple.     Comments: Lumbar sacral spine.  Patient reports TTP over left paraspinous/inferior spinal erectors, no deformity noted  Skin:    General: Skin is warm and dry.  Neurological:     General: No focal deficit present.     Mental Status: He is alert and oriented to person, place, and time.      UC Treatments / Results  Labs (all labs ordered are listed, but only abnormal results are displayed) Labs Reviewed - No data to display  EKG   Radiology DG Lumbar Spine Complete  Result Date: 12/01/2021 CLINICAL DATA:  Left-sided sciatica. EXAM: LUMBAR SPINE - COMPLETE 4+ VIEW COMPARISON:  None Available. FINDINGS: There is no evidence of lumbar spine fracture. Alignment is normal. Intervertebral disc spaces are maintained. IMPRESSION: Negative. Electronically  Signed   By: Misty Stanley M.D.   On: 12/01/2021 10:28    Procedures Procedures (including critical care time)  Medications Ordered in UC Medications - No data to display  Initial Impression / Assessment and Plan / UC Course  I have reviewed the triage vital signs and the nursing notes.  Pertinent labs & imaging results that were available during my care of the patient were reviewed by me and considered in my medical decision making (see chart for details).     MDM: 1.  Sciatica of left side-Rx Sterapred Unipak, Robaxin. Advised/informed patient of lower back x-ray results with hard copy provided to patient.  Advised patient to take medication as directed with food to completion.  Advised patient may take Robaxin daily for accompanying muscle spasms of lower back.  Encouraged patient to increase daily water intake to 64 ounces per day while taking these medications.  Advised patient if symptoms worsen and/or unresolved please follow-up with PCP or or here for further evaluation.  Work note provided to patient prior to discharge.  Patient discharged home, hemodynamically stable. Final Clinical Impressions(s) / UC Diagnoses   Final diagnoses:  Sciatica of left side     Discharge Instructions      Advised/informed patient of lower back x-ray results with hard copy provided to patient.  Advised patient to take medication as directed with food to completion.  Advised patient may take Robaxin daily for accompanying muscle spasms of lower back.  Encouraged patient to increase daily water intake to 64 ounces per day while taking these medications.  Advised patient if symptoms worsen and/or unresolved please follow-up with PCP or or here for further evaluation.     ED Prescriptions     Medication Sig  Dispense Auth. Provider   predniSONE (STERAPRED UNI-PAK 21 TAB) 10 MG (21) TBPK tablet Take by mouth daily. Take 6 tabs by mouth daily  for 2 days, then 5 tabs for 2 days, then 4 tabs for 2  days, then 3 tabs for 2 days, 2 tabs for 2 days, then 1 tab by mouth daily for 2 days 42 tablet Eliezer Lofts, FNP   methocarbamol (ROBAXIN) 500 MG tablet Take 1 tablet (500 mg total) by mouth 3 (three) times daily as needed. 30 tablet Eliezer Lofts, FNP      I have reviewed the PDMP during this encounter.   Eliezer Lofts, [place] 12/01/21 1047

## 2021-12-01 NOTE — ED Notes (Signed)
Ice pak provided to pt after returning from Imaging.

## 2021-12-20 ENCOUNTER — Ambulatory Visit: Payer: Managed Care, Other (non HMO) | Admitting: Sports Medicine

## 2021-12-20 DIAGNOSIS — K219 Gastro-esophageal reflux disease without esophagitis: Secondary | ICD-10-CM

## 2021-12-20 DIAGNOSIS — M5135 Other intervertebral disc degeneration, thoracolumbar region: Secondary | ICD-10-CM | POA: Diagnosis not present

## 2021-12-20 MED ORDER — TRIAZOLAM 0.25 MG PO TABS: ORAL_TABLET | ORAL | 0 refills | Status: DC

## 2021-12-20 NOTE — Progress Notes (Signed)
    Procedures performed today:    None.  Independent interpretation of notes and tests performed by another provider:   None.  Brief History, Exam, Impression, and Recommendations:    DDD (degenerative disc disease), thoracolumbar Dahir has had years of axial low back pain, thoracic and lumbar spine worse with sitting, flexion, Valsalva. Nothing overtly radicular. He has had multiple visits with me, urgent care, he had multiple courses of steroids, he had x-rays that were unrevealing. Continues to have severe axial thoracic and lumbar back pain, thoracic back pain potentially wraps around under the costal margin. Considering greater than 6 weeks of conservative treatment with failure we will proceed with MRI of the thoracic and lumbar spine. He also has several tender trigger points across his shoulder and hip girdle, low back. This is also consistent with fibromyalgia, we will treat it as such if we are unable to find a pain generator in his thoracolumbar spine. He does have severe claustrophobia so adding triazolam for preprocedural anxiolysis. Return to see me to go over MRI results.  GERD (gastroesophageal reflux disease) Rayson continues to have upper abdominal pain, he does have history of acid reflux, I would like him to discuss this in further details with his PCP, I will go ahead and add an abdominal x-ray today.    ____________________________________________ Gwen Her. Dianah Field, M.D., ABFM., CAQSM., AME. Primary Care and Sports Medicine [facility]  Adjunct Professor of [place] [employer] [employer] [facility] [employer]  Risk manager

## 2021-12-20 NOTE — Assessment & Plan Note (Signed)
Nickalous has had years of axial low back pain, thoracic and lumbar spine worse with sitting, flexion, Valsalva. Nothing overtly radicular. He has had multiple visits with me, urgent care, he had multiple courses of steroids, he had x-rays that were unrevealing. Continues to have severe axial thoracic and lumbar back pain, thoracic back pain potentially wraps around under the costal margin. Considering greater than 6 weeks of conservative treatment with failure we will proceed with MRI of the thoracic and lumbar spine. He also has several tender trigger points across his shoulder and hip girdle, low back. This is also consistent with fibromyalgia, we will treat it as such if we are unable to find a pain generator in his thoracolumbar spine. He does have severe claustrophobia so adding triazolam for preprocedural anxiolysis. Return to see me to go over MRI results.

## 2021-12-20 NOTE — Assessment & Plan Note (Signed)
Brendan Neal continues to have upper abdominal pain, he does have history of acid reflux, I would like him to discuss this in further details with his PCP, I will go ahead and add an abdominal x-ray today.

## 2021-12-26 ENCOUNTER — Other Ambulatory Visit: Payer: Self-pay | Admitting: Sports Medicine

## 2021-12-26 DIAGNOSIS — M5135 Other intervertebral disc degeneration, thoracolumbar region: Secondary | ICD-10-CM

## 2021-12-26 MED ORDER — TRIAZOLAM 0.25 MG PO TABS: ORAL_TABLET | ORAL | 0 refills | Status: DC

## 2021-12-27 ENCOUNTER — Encounter: Payer: Self-pay | Admitting: Sports Medicine

## 2021-12-29 ENCOUNTER — Other Ambulatory Visit: Payer: Self-pay | Admitting: Sports Medicine

## 2021-12-29 ENCOUNTER — Telehealth: Payer: Self-pay

## 2021-12-29 DIAGNOSIS — M5135 Other intervertebral disc degeneration, thoracolumbar region: Secondary | ICD-10-CM

## 2021-12-29 MED ORDER — TRIAZOLAM 0.25 MG PO TABS: ORAL_TABLET | ORAL | 0 refills | Status: DC

## 2021-12-29 NOTE — Telephone Encounter (Signed)
Patient states the MRI is broke up into two sessions and he is going Saturday and Sunday and was wondering can you send in two more tablets for the second session.

## 2021-12-31 ENCOUNTER — Other Ambulatory Visit: Payer: Managed Care, Other (non HMO)

## 2021-12-31 ENCOUNTER — Ambulatory Visit: Payer: Managed Care, Other (non HMO)

## 2022-01-01 ENCOUNTER — Other Ambulatory Visit: Payer: Managed Care, Other (non HMO)

## 2022-01-09 ENCOUNTER — Encounter: Payer: Self-pay | Admitting: Family Medicine

## 2022-01-09 ENCOUNTER — Ambulatory Visit: Payer: Managed Care, Other (non HMO) | Admitting: Family Medicine

## 2022-01-09 VITALS — BP 123/86 | HR 97 | Ht 72.0 in | Wt 289.0 lb

## 2022-01-09 DIAGNOSIS — R052 Subacute cough: Secondary | ICD-10-CM

## 2022-01-09 DIAGNOSIS — M5135 Other intervertebral disc degeneration, thoracolumbar region: Secondary | ICD-10-CM | POA: Diagnosis not present

## 2022-01-09 DIAGNOSIS — M940 Chondrocostal junction syndrome [Tietze]: Secondary | ICD-10-CM | POA: Diagnosis not present

## 2022-01-09 NOTE — Patient Instructions (Signed)
For ribs:  Try heating pad to the ribs.   You may continue arnica.   Topical voltaren may help as well.   For cough:  Continue to work on deep breathing exercises  Humidifier in home can be helpful.   Use of nasal saline before sleep and throughout the day may help some as well.

## 2022-01-11 DIAGNOSIS — R059 Cough, unspecified: Secondary | ICD-10-CM | POA: Insufficient documentation

## 2022-01-11 NOTE — Assessment & Plan Note (Signed)
Unable to have MRI completed due to claustrophobia.  We did discuss other options for MRI however he would have to travel to [place] for this.  He will let us know if he is interested in ordering this.

## 2022-01-11 NOTE — Assessment & Plan Note (Signed)
Discussed that postviral cough can last for several weeks after initial illness resolves.  Recommend using humidifier at home as well as nasal saline rinses throughout the day.

## 2022-01-11 NOTE — Progress Notes (Signed)
Brendan Neal - 56 y.o. male MRN [id number]  Date of birth: Sep 30, 1965  Subjective No chief complaint on file.   HPI Brendan Neal is a 57 year old male here today for follow-up visit.  He has been seeing Dr. Dianah Field for degenerative disc disease in his back.  Sent for MRI of the lumbar and thoracic spine however was unable to have this completed due to severe claustrophobia.  He did not want to use sedating medications because he felt like this would cause him to possibly freak out more because he would be unable to express how he is actually feeling.  The MRI tech did mention having IV sedation however he definitely does not want to have this done.  He is possibly interested in a stand-up type MRI.    He also reports continued rib pain.  This started after an upper respiratory infection with strong cough.  Noted some popping sensation.  He has been wearing a rib belt but continues to have some pain.  Denies shortness of breath.  He has been using Arnica as well which does provide some relief.  Still with mild cough.  Chest x-ray at the end of September noted mild atelectasis of the left lower lung.  He has been working on some deep breathing exercises.  ROS:  A comprehensive ROS was completed and negative except as noted per HPI    Allergies  Allergen Reactions   Singulair [Montelukast]     Feels weird    Sudafed [Pseudoephedrine]     Heart racing.     Past Medical History:  Diagnosis Date   Acute left lumbar radiculopathy 01/30/2019   Cervico-occipital neuralgia of left side 07/08/2015   Chest discomfort 03/01/2018   DVT (deep venous thrombosis) (HCC)    Environmental allergies 02/18/2019   Facial flushing 03/01/2018   GAD (generalized anxiety disorder) 02/17/2015   Genital warts    GERD (gastroesophageal reflux disease)    HTN (hypertension) 09/11/2014   Hypertension    Lumbar degenerative disc disease 01/30/2019   Nasal septal deviation 10/06/2016   Obese 09/23/2015   Panic attacks  02/20/2019   Seborrheic dermatitis 07/02/2015   Sinusitis, chronic 09/11/2014   Situational anxiety 09/11/2014   Snores 09/11/2014    Past Surgical History:  Procedure Laterality Date   TONSILLECTOMY     WISDOM TOOTH EXTRACTION      Social History   Socioeconomic History   Marital status: Married    Spouse name: Not on file   Number of children: Not on file   Years of education: Not on file   Highest education level: Not on file  Occupational History   Not on file  Tobacco Use   Smoking status: Never   Smokeless tobacco: Never  Vaping Use   Vaping Use: Never used  Substance and Sexual Activity   Alcohol use: No   Drug use: No   Sexual activity: Yes  Other Topics Concern   Not on file  Social History Narrative   Not on file   Social Determinants of Health   Financial Resource Strain: Not on file  Food Insecurity: Not on file  Transportation Needs: Not on file  Physical Activity: Not on file  Stress: Not on file  Social Connections: Not on file    Family History  Problem Relation Age of Onset   Alcohol abuse Brother    Depression Brother    Diabetes Maternal Aunt    High blood pressure Mother    Hypertension Mother  Hyperlipidemia Mother    Migraines Father    High blood pressure Father    Hypertension Father    Varicose Veins Father    High blood pressure Maternal Grandmother    High blood pressure Maternal Grandfather    High blood pressure Paternal Grandmother    Diabetes Paternal Grandfather    High blood pressure Paternal Grandfather     Health Maintenance  Topic Date Due   Zoster Vaccines- Shingrix (1 of 2) 04/11/2022 (Originally 05/20/1986)   COLONOSCOPY (Pts 45-79yr Insurance coverage will need to be confirmed)  01/10/2023 (Originally 05/19/2012)   Hepatitis C Screening  01/10/2023 (Originally 05/19/1985)   COVID-19 Vaccine (4 - 2023-24 season) 02/28/2022   INFLUENZA VACCINE  Completed   HIV Screening  Completed   HPV VACCINES  Aged Out      ----------------------------------------------------------------------------------------------------------------------------------------------------------------------------------------------------------------- Physical Exam BP 123/86 (BP Location: Left Arm, Patient Position: Sitting, Cuff Size: Large)   Pulse 97   Ht 6' (1.829 m)   Wt 289 lb (131.1 kg)   SpO2 96%   BMI 39.20 kg/m   Physical Exam Constitutional:      Appearance: Normal appearance.  HENT:     Head: Normocephalic and atraumatic.  Eyes:     General: No scleral icterus. Cardiovascular:     Rate and Rhythm: Normal rate and regular rhythm.     Pulses: Normal pulses.     Heart sounds: Normal heart sounds.  Musculoskeletal:     Cervical back: Neck supple.  Neurological:     General: No focal deficit present.     Mental Status: He is alert.  Psychiatric:        Mood and Affect: Mood normal.        Behavior: Behavior normal.     ------------------------------------------------------------------------------------------------------------------------------------------------------------------------------------------------------------------- Assessment and Plan  Slipping rib syndrome He still continues to have some rib pain.  He may continue Arnica.  Recommend trial of Voltaren gel as well.  Heat may be helpful for this area as well.  DDD (degenerative disc disease), thoracolumbar Unable to have MRI completed due to claustrophobia.  We did discuss other options for MRI however [place]  He will [place] if he is interested in ordering this.  Cough Discussed that postviral cough can last for several weeks after initial illness resolves.  Recommend using humidifier at home as well as nasal saline rinses throughout the day.   No orders of the defined types were placed in this encounter.   No follow-ups on file.    This visit occurred during the SARS-CoV-2 public health  emergency.  Safety protocols were in place, including screening questions prior to the visit, additional usage of staff PPE, and extensive cleaning of exam room while observing appropriate contact time as indicated for disinfecting solutions.

## 2022-01-11 NOTE — Assessment & Plan Note (Signed)
He still continues to have some rib pain.  He may continue Arnica.  Recommend trial of Voltaren gel as well.  Heat may be helpful for this area as well.

## 2022-03-31 ENCOUNTER — Other Ambulatory Visit: Payer: Self-pay

## 2022-03-31 DIAGNOSIS — I10 Essential (primary) hypertension: Secondary | ICD-10-CM

## 2022-03-31 MED ORDER — METOPROLOL SUCCINATE ER 50 MG PO TB24: ORAL_TABLET | ORAL | 3 refills | Status: DC

## 2022-03-31 MED ORDER — OLMESARTAN MEDOXOMIL 5 MG PO TABS: ORAL_TABLET | ORAL | 3 refills | Status: DC

## 2022-05-23 ENCOUNTER — Ambulatory Visit (INDEPENDENT_AMBULATORY_CARE_PROVIDER_SITE_OTHER): Payer: Managed Care, Other (non HMO)

## 2022-05-23 ENCOUNTER — Encounter: Payer: Self-pay | Admitting: Family Medicine

## 2022-05-23 ENCOUNTER — Ambulatory Visit (INDEPENDENT_AMBULATORY_CARE_PROVIDER_SITE_OTHER): Payer: Managed Care, Other (non HMO) | Admitting: Family Medicine

## 2022-05-23 VITALS — BP 134/89 | HR 69 | Temp 98.0°F | Resp 18 | Ht 72.0 in | Wt 287.3 lb

## 2022-05-23 DIAGNOSIS — M79672 Pain in left foot: Secondary | ICD-10-CM

## 2022-05-23 DIAGNOSIS — M7989 Other specified soft tissue disorders: Secondary | ICD-10-CM | POA: Diagnosis not present

## 2022-05-23 NOTE — Progress Notes (Signed)
Hi Brendan Neal, great news!! No blood clot. If you are able to get you labs drawn this week that would be wonderful. Thank you.

## 2022-05-23 NOTE — Progress Notes (Signed)
Acute Office Visit  Subjective:     Patient ID: Brendan Neal, male    DOB: 24-Nov-1965, 57 y.o.   MRN: [id number]  Chief Complaint  Patient presents with   Foot Swelling    Patient states that his left foot(Ankle) has been swelling for a little over two weeks, with some numbness on top of his foot. He also states that his right foot is starting to swell as well    HPI Patient is in today for left foot pain and swelling.  He says it started about 3 weeks ago with swelling in his left foot.  He does have a history of an old soccer injury in that leg but has never swollen like this before he does get some occasional mild swelling with weather change etc.  But he has been much more sedentary has been sitting for long periods but he has been try to wear his compression stockings.  He says last week it started become little nervous because the top of the foot and she started to feel numb.  He does feel like the swelling is just a little bit better today.  He did cut back on his salt.  He has not really had any major medication changes he did start taking his Claritin daily.  Also noticed some slight swelling in his right foot.  ROS      Objective:    BP 134/89   Pulse 69   Temp 98 F (36.7 C) (Oral)   Resp 18   Ht 6' (1.829 m)   Wt 287 lb 4.8 oz (130.3 kg)   SpO2 97%   BMI 38.96 kg/m    Physical Exam Constitutional:      Appearance: Normal appearance. He is well-developed.  HENT:     Head: Normocephalic and atraumatic.  Cardiovascular:     Rate and Rhythm: Normal rate and regular rhythm.     Heart sounds: Normal heart sounds.  Pulmonary:     Effort: Pulmonary effort is normal.  Skin:    General: Skin is warm and dry.     Comments: Pitting edema just below the left knee and becomes 1+ edema above the ankle and 2+ edema on the foot.  No discoloration.  No rash.  No significant pain with dorsiflexion.  Some discomfort with calf squeeze.  Some trace edema on the right foot.   Neurological:     Mental Status: He is alert and oriented to person, place, and time.  Psychiatric:        Behavior: Behavior normal.     No results found for any visits on 05/23/22.      Assessment & Plan:   Problem List Items Addressed This Visit   None Visit Diagnoses     Left foot pain    -  Primary   Relevant Orders   US Venous Img Lower Unilateral Left (Completed)   CBC with Differential/Platelet   COMPLETE METABOLIC PANEL WITH GFR   TSH   Urinalysis, Routine w reflex microscopic   Left leg swelling       Relevant Orders   US Venous Img Lower Unilateral Left (Completed)   CBC with Differential/Platelet   COMPLETE METABOLIC PANEL WITH GFR   TSH   Urinalysis, Routine w reflex microscopic      Left lower leg swelling-we will start with getting a Doppler just to rule out DVT has a prior history of DVT back when he had COVID.  That was the only  time that he had that he does have a prior is an injury on the ankle as well but has never had swelling like this before.  We discussed also checking renal function, liver function, thyroid and checking for proteinuria.  No other signs of heart failure at this time.  Everything comes back normal and reassuring we could consider just a trial of a diuretic for couple days to try to provide some relief.  He is planning on traveling out of state at the end of the week for conference and so will be driving for hours.  Did discuss hydrating well, getting out moving frequently and wearing compression stockings with travel.  He will try to get the blood work done in the next few days.  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  No orders of the defined types were placed in this encounter.   No follow-ups on file.  Beatrice Lecher, MD

## 2022-08-14 ENCOUNTER — Ambulatory Visit
Admission: EM | Admit: 2022-08-14 | Discharge: 2022-08-14 | Disposition: A | Discharge status: Home / Self Care | Payer: Managed Care, Other (non HMO) | Attending: Family Medicine | Admitting: Family Medicine

## 2022-08-14 ENCOUNTER — Ambulatory Visit (INDEPENDENT_AMBULATORY_CARE_PROVIDER_SITE_OTHER): Payer: Managed Care, Other (non HMO)

## 2022-08-14 DIAGNOSIS — M62830 Muscle spasm of back: Secondary | ICD-10-CM | POA: Diagnosis not present

## 2022-08-14 DIAGNOSIS — M545 Low back pain, unspecified: Secondary | ICD-10-CM

## 2022-08-14 DIAGNOSIS — M5440 Lumbago with sciatica, unspecified side: Secondary | ICD-10-CM

## 2022-08-14 DIAGNOSIS — G8929 Other chronic pain: Secondary | ICD-10-CM

## 2022-08-14 DIAGNOSIS — S39012A Strain of muscle, fascia and tendon of lower back, initial encounter: Secondary | ICD-10-CM

## 2022-08-14 MED ORDER — KETOROLAC TROMETHAMINE 60 MG/2ML IM SOLN
60.0000 mg | Freq: Once | INTRAMUSCULAR | Status: AC
  Administered 2022-08-14: 60 mg via INTRAMUSCULAR

## 2022-08-14 MED ORDER — DEXAMETHASONE SODIUM PHOSPHATE 10 MG/ML IJ SOLN
10.0000 mg | Freq: Once | INTRAMUSCULAR | Status: AC
  Administered 2022-08-14: 10 mg via INTRAMUSCULAR

## 2022-08-14 MED ORDER — METHOCARBAMOL 500 MG PO TABS: 500.0000 mg | ORAL_TABLET | Freq: Every day | ORAL | 0 refills | Status: DC | PRN

## 2022-08-14 MED ORDER — CELECOXIB 200 MG PO CAPS: 200.0000 mg | ORAL_CAPSULE | Freq: Every day | ORAL | 0 refills | Status: AC

## 2022-08-14 NOTE — ED Provider Notes (Addendum)
[facility]    CSN: [id number] Arrival date & time: 08/14/22  4098      History   Chief Complaint Chief Complaint  Patient presents with   Back Pain    HPI Pat Brendan Neal is a 57 y.o. male.   HPI 57 year old male presents with back pain lower left-sided back pain.  Patient reports was attempting to go for a walk yesterday morning and made a twisting motion putting on shoes which caused severe pain.  Patient is accompanied by his husband this morning.  PMH significant for acute left lumbar radiculopathy, morbid obesity, and HTN.  Past Medical History:  Diagnosis Date   Acute left lumbar radiculopathy 01/30/2019   Cervico-occipital neuralgia of left side 07/08/2015   Chest discomfort 03/01/2018   DVT (deep venous thrombosis) (HCC)    Environmental allergies 02/18/2019   Facial flushing 03/01/2018   GAD (generalized anxiety disorder) 02/17/2015   Genital warts    GERD (gastroesophageal reflux disease)    HTN (hypertension) 09/11/2014   Hypertension    Lumbar degenerative disc disease 01/30/2019   Nasal septal deviation 10/06/2016   Obese 09/23/2015   Panic attacks 02/20/2019   Seborrheic dermatitis 07/02/2015   Sinusitis, chronic 09/11/2014   Situational anxiety 09/11/2014   Snores 09/11/2014    Patient Active Problem List   Diagnosis Date Noted   Cough 01/11/2022   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   Colon cancer screening 04/03/2021   Neoplasm of uncertain behavior of skin 04/03/2021   Left ankle pain 09/22/2019   Panic attacks 02/20/2019   Environmental allergies 02/18/2019   DDD (degenerative disc disease), thoracolumbar 01/30/2019   Nasal septal deviation 10/06/2016   Obese 09/23/2015   GAD (generalized anxiety disorder) 02/17/2015   HTN (hypertension) 09/11/2014   Snores 09/11/2014   Situational anxiety 09/11/2014   Sinusitis, chronic 09/11/2014   GERD (gastroesophageal reflux disease) 09/11/2014    Past Surgical History:  Procedure Laterality Date    TONSILLECTOMY     WISDOM TOOTH EXTRACTION         Home Medications    Prior to Admission medications   Medication Sig Start Date End Date Taking? Authorizing Provider  celecoxib (CELEBREX) 200 MG capsule Take 1 capsule (200 mg total) by mouth daily for 15 days. 08/14/22 08/29/22 Yes Trevor Iha, FNP  methocarbamol (ROBAXIN) 500 MG tablet Take 1 tablet (500 mg total) by mouth daily as needed for muscle spasms. 08/14/22  Yes Trevor Iha, FNP  ALPRAZolam Prudy Feeler) 0.5 MG tablet Take 1 hour prior to flight.  May repeat if needing during flight. 06/27/21   Everrett Coombe, DO  aspirin EC 81 MG tablet Take 81 mg by mouth daily. Swallow whole.    [provider]  Calcium Carbonate Antacid (TUMS PO) Take by mouth daily as needed.     [provider]  fluticasone (FLONASE) 50 MCG/ACT nasal spray Place 1 spray into both nostrils daily as needed for allergies or rhinitis.    [provider]  Homeopathic Products (ARNICARE) GEL  12/26/21   [provider]  hydrOXYzine (ATARAX/VISTARIL) 25 MG tablet TAKE 1 TABLET(25 MG) BY MOUTH EVERY 8 HOURS AS NEEDED FOR ANXIETY 02/05/20   Sunnie Nielsen, DO  loratadine (CLARITIN) 10 MG tablet Take 10 mg by mouth daily as needed.     [provider]  metoprolol succinate (TOPROL-XL) 50 MG 24 hr tablet TAKE 1 TABLET(50 MG) BY MOUTH DAILY WITH OR IMMEDIATELY FOLLOWING A MEAL 03/31/22   Everrett Coombe, DO  Multiple Vitamin (  MULTIVITAMIN) tablet Take 1 tablet by mouth daily.    [provider]  olmesartan (BENICAR) 5 MG tablet TAKE 1 TABLET(5 MG) BY MOUTH DAILY 03/31/22   Everrett Coombe, DO  rizatriptan (MAXALT-MLT) 5 MG disintegrating tablet DISSOLVE 1 TABLET ON THE TONGUE AT ONSET OF HEADACHE. MAY REPEAT IN 2 HOURS AS NEEDED OR AS DIRECTED 11/04/21   Everrett Coombe, DO  Simethicone (GAS-X PO) Take by mouth as needed.    [provider]  sodium chloride (OCEAN) 0.65 % SOLN nasal spray Place 1 spray into both  nostrils as needed for congestion.    [provider]  triazolam (HALCION) 0.25 MG tablet 1-2 tabs PO 2 hours before procedure or imaging.  Do not drive with this medication. 12/29/21   Monica Becton, MD    Family History Family History  Problem Relation Age of Onset   Alcohol abuse Brother    Depression Brother    Diabetes Maternal Aunt    High blood pressure Mother    Hypertension Mother    Hyperlipidemia Mother    Migraines Father    High blood pressure Father    Hypertension Father    Varicose Veins Father    High blood pressure Maternal Grandmother    High blood pressure Maternal Grandfather    High blood pressure Paternal Grandmother    Diabetes Paternal Grandfather    High blood pressure Paternal Grandfather     Social History Social History   Tobacco Use   Smoking status: Never   Smokeless tobacco: Never  Vaping Use   Vaping Use: Never used  Substance Use Topics   Alcohol use: No   Drug use: No     Allergies   Singulair [montelukast] and Sudafed [pseudoephedrine]   Review of Systems Review of Systems  Musculoskeletal:  Positive for back pain.  All other systems reviewed and are negative.    Physical Exam Triage Vital Signs ED Triage Vitals  Enc Vitals Group     BP 08/14/22 0820 126/87     Pulse Rate 08/14/22 0820 86     Resp 08/14/22 0820 17     Temp 08/14/22 0820 97.8 F (36.6 C)     Temp Source 08/14/22 0820 Oral     SpO2 08/14/22 0820 96 %     Weight --      Height --      Head Circumference --      Peak Flow --      Pain Score 08/14/22 0822 2     Pain Loc --      Pain Edu? --      Excl. in GC? --    No data found.  Updated Vital Signs BP 126/87 (BP Location: Right Arm)   Pulse 86   Temp 97.8 F (36.6 C) (Oral)   Resp 17   SpO2 96%      Physical Exam Vitals and nursing note reviewed.  Constitutional:      Appearance: Normal appearance.  HENT:     Head: Normocephalic and atraumatic.     Mouth/Throat:      Mouth: Mucous membranes are moist.     Pharynx: Oropharynx is clear.  Eyes:     Extraocular Movements: Extraocular movements intact.     Conjunctiva/sclera: Conjunctivae normal.     Pupils: Pupils are equal, round, and reactive to light.  Cardiovascular:     Rate and Rhythm: Normal rate and regular rhythm.     Pulses: Normal pulses.  Heart sounds: Normal heart sounds.  Pulmonary:     Effort: Pulmonary effort is normal.     Breath sounds: Normal breath sounds. No wheezing or rhonchi.  Musculoskeletal:        General: Normal range of motion.     Cervical back: Normal range of motion and neck supple.     Comments: Patient reporting pain to left lower lumbar sacral region  Skin:    General: Skin is warm and dry.  Neurological:     General: No focal deficit present.     Mental Status: He is alert and oriented to person, place, and time.  Psychiatric:        Mood and Affect: Mood normal.        Behavior: Behavior normal.      UC Treatments / Results  Labs (all labs ordered are listed, but only abnormal results are displayed) Labs Reviewed - No data to display  EKG   Radiology DG Lumbar Spine Complete  Result Date: 08/14/2022 CLINICAL DATA:  Lower left-sided back pain EXAM: LUMBAR SPINE - COMPLETE 4+ VIEW COMPARISON:  12/01/2021 FINDINGS: There is no evidence of lumbar spine fracture. Alignment is normal. Intervertebral disc spaces are maintained. Minimal facet arthropathy of the lower lumbar spine. IMPRESSION: Minimal facet arthropathy of the lower lumbar spine. No acute findings. Electronically Signed   By: Duanne Guess D.O.   On: 08/14/2022 09:15    Procedures Procedures (including critical care time)  Medications Ordered in UC Medications  dexamethasone (DECADRON) injection 10 mg (10 mg Intramuscular Given 08/14/22 0938)  ketorolac (TORADOL) injection 60 mg (60 mg Intramuscular Given 08/14/22 0938)    Initial Impression / Assessment and Plan / UC Course  I have  reviewed the triage vital signs and the nursing notes.  Pertinent labs & imaging results that were available during my care of the patient were reviewed by me and considered in my medical decision making (see chart for details).     MDM: 1.  Chronic low back pain with sciatica sciatica laterality unspecified, unspecified back pain-IM Decadron 10 mg/IM Toradol 60 mg given once in clinic and prior to discharge; 2.  Strain of lumbar region, initial encounter-Rx'd Celebrex 200 mg daily x 10 days; 3.  Muscle spasm of back-Rx'd Robaxin daily, as needed. Instructed patient take medication as directed with food to completion.  Advised may use Robaxin daily or as needed for accompanying muscle spasms of lumbar spine.  Encouraged to increase daily water intake to 64 ounces per day while taking these medications.  Advised if symptoms worsen and/or unresolved please follow-up with PCP for physical therapy of lower back region.  Work note provided to patient per request prior to discharge.  Patient requested work note putting him back to work Friday, 08/18/2022.  Patient reports sitting at desk chair for long hours at work makes his back pain worse.  Patient discharged home, hemodynamically stable.   Final Clinical Impressions(s) / UC Diagnoses   Final diagnoses:  Chronic low back pain with sciatica, sciatica laterality unspecified, unspecified back pain laterality  Strain of lumbar region, initial encounter  Muscle spasm of back     Discharge Instructions      Instructed patient take medication as directed with food to completion.  Advised may use Robaxin daily or as needed for accompanying muscle spasms of lumbar spine.  Encouraged to increase daily water intake to 64 ounces per day while taking these medications.  Advised if symptoms worsen and/or unresolved please follow-up  with PCP for physical therapy of lower back region.     ED Prescriptions     Medication Sig Dispense Auth. Provider    celecoxib (CELEBREX) 200 MG capsule Take 1 capsule (200 mg total) by mouth daily for 15 days. 15 capsule Trevor Iha, FNP   methocarbamol (ROBAXIN) 500 MG tablet Take 1 tablet (500 mg total) by mouth daily as needed for muscle spasms. 20 tablet Trevor Iha, FNP      PDMP not reviewed this encounter.   Trevor Iha, FNP 08/14/22 0947    Trevor Iha, FNP 08/14/22 [phone]

## 2022-08-14 NOTE — ED Triage Notes (Signed)
Pt c/o back pain since yesterday morning when he felt a sharp pain in back when putting on shoes yesterday. Hx of sciatica. Heat and arnica prn.

## 2022-08-14 NOTE — Discharge Instructions (Addendum)
Instructed patient take medication as directed with food to completion.  Advised may use Robaxin daily or as needed for accompanying muscle spasms of lumbar spine.  Encouraged to increase daily water intake to 64 ounces per day while taking these medications.  Advised if symptoms worsen and/or unresolved please follow-up with PCP for physical therapy of lower back region.

## 2022-08-15 ENCOUNTER — Encounter: Payer: Self-pay | Admitting: Family Medicine

## 2022-08-15 DIAGNOSIS — G8929 Other chronic pain: Secondary | ICD-10-CM

## 2022-08-30 ENCOUNTER — Ambulatory Visit: Payer: Managed Care, Other (non HMO) | Admitting: Physical Therapy

## 2022-09-16 ENCOUNTER — Other Ambulatory Visit: Payer: Self-pay

## 2022-09-16 ENCOUNTER — Ambulatory Visit
Admission: EM | Admit: 2022-09-16 | Discharge: 2022-09-16 | Disposition: A | Discharge status: Home / Self Care | Payer: Managed Care, Other (non HMO) | Attending: Family Medicine | Admitting: Family Medicine

## 2022-09-16 DIAGNOSIS — S39012A Strain of muscle, fascia and tendon of lower back, initial encounter: Secondary | ICD-10-CM

## 2022-09-16 DIAGNOSIS — M5440 Lumbago with sciatica, unspecified side: Secondary | ICD-10-CM

## 2022-09-16 DIAGNOSIS — G8929 Other chronic pain: Secondary | ICD-10-CM | POA: Diagnosis not present

## 2022-09-16 MED ORDER — DEXAMETHASONE SODIUM PHOSPHATE 10 MG/ML IJ SOLN
10.0000 mg | Freq: Once | INTRAMUSCULAR | Status: AC
  Administered 2022-09-16: 10 mg via INTRAMUSCULAR

## 2022-09-16 MED ORDER — KETOROLAC TROMETHAMINE 60 MG/2ML IM SOLN
60.0000 mg | Freq: Once | INTRAMUSCULAR | Status: AC
  Administered 2022-09-16: 60 mg via INTRAMUSCULAR

## 2022-09-16 MED ORDER — DEXAMETHASONE SODIUM PHOSPHATE 10 MG/ML IJ SOLN: 10.0000 mg | Freq: Once | INTRAMUSCULAR | Status: DC

## 2022-09-16 MED ORDER — CELECOXIB 200 MG PO CAPS: 200.0000 mg | ORAL_CAPSULE | Freq: Every day | ORAL | 0 refills | Status: AC

## 2022-09-16 NOTE — ED Triage Notes (Signed)
Pt presents to uc with co of back pain pain worse on left side radiating down left leg with numbness. Pt reports recently here 6/24 and he received a shot and pain meds which helped but now the pain is back.

## 2022-09-16 NOTE — ED Provider Notes (Signed)
[facility]    CSN: [id number] Arrival date & time: 09/16/22  0851      History   Chief Complaint Chief Complaint  Patient presents with   Back Pain    HPI Brendan Neal is a 57 y.o. male.   HPI 57 year old male presents with left-sided back pain.  PMH significant for morbid obesity, acute left lumbar radiculopathy, and HTN.  Patient is accompanied by his husband today.  Patient is quite familiar to me as I have seen him 2 times previous in the past for back pain.  Patient seated on exam table in upright position with lumbar support and legs stretched out over her lower leg tray of bed.  Patient reports previous IM injections of 08/14/2022 helped relieve the acute pain.  Additionally, reports p.o. Celebrex helped with left-sided lower back pain as well.  Past Medical History:  Diagnosis Date   Acute left lumbar radiculopathy 01/30/2019   Cervico-occipital neuralgia of left side 07/08/2015   Chest discomfort 03/01/2018   DVT (deep venous thrombosis) (HCC)    Environmental allergies 02/18/2019   Facial flushing 03/01/2018   GAD (generalized anxiety disorder) 02/17/2015   Genital warts    GERD (gastroesophageal reflux disease)    HTN (hypertension) 09/11/2014   Hypertension    Lumbar degenerative disc disease 01/30/2019   Nasal septal deviation 10/06/2016   Obese 09/23/2015   Panic attacks 02/20/2019   Seborrheic dermatitis 07/02/2015   Sinusitis, chronic 09/11/2014   Situational anxiety 09/11/2014   Snores 09/11/2014    Patient Active Problem List   Diagnosis Date Noted   Cough 01/11/2022   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   Colon cancer screening 04/03/2021   Neoplasm of uncertain behavior of skin 04/03/2021   Left ankle pain 09/22/2019   Panic attacks 02/20/2019   Environmental allergies 02/18/2019   DDD (degenerative disc disease), thoracolumbar 01/30/2019   Nasal septal deviation 10/06/2016   Obese 09/23/2015   GAD (generalized anxiety disorder) 02/17/2015    HTN (hypertension) 09/11/2014   Snores 09/11/2014   Situational anxiety 09/11/2014   Sinusitis, chronic 09/11/2014   GERD (gastroesophageal reflux disease) 09/11/2014    Past Surgical History:  Procedure Laterality Date   TONSILLECTOMY     WISDOM TOOTH EXTRACTION         Home Medications    Prior to Admission medications   Medication Sig Start Date End Date Taking? Authorizing Provider  celecoxib (CELEBREX) 200 MG capsule Take 1 capsule (200 mg total) by mouth daily for 15 days. 09/16/22 10/01/22 Yes Trevor Iha, FNP  ALPRAZolam Prudy Feeler) 0.5 MG tablet Take 1 hour prior to flight.  May repeat if needing during flight. 06/27/21   Everrett Coombe, DO  aspirin EC 81 MG tablet Take 81 mg by mouth daily. Swallow whole.    [provider]  Calcium Carbonate Antacid (TUMS PO) Take by mouth daily as needed.     [provider]  fluticasone (FLONASE) 50 MCG/ACT nasal spray Place 1 spray into both nostrils daily as needed for allergies or rhinitis.    [provider]  Homeopathic Products (ARNICARE) GEL  12/26/21   [provider]  hydrOXYzine (ATARAX/VISTARIL) 25 MG tablet TAKE 1 TABLET(25 MG) BY MOUTH EVERY 8 HOURS AS NEEDED FOR ANXIETY 02/05/20   Sunnie Nielsen, DO  loratadine (CLARITIN) 10 MG tablet Take 10 mg by mouth daily as needed.     [provider]  methocarbamol (ROBAXIN) 500 MG tablet Take 1 tablet (500 mg total) by mouth daily  as needed for muscle spasms. 08/14/22   Trevor Iha, FNP  metoprolol succinate (TOPROL-XL) 50 MG 24 hr tablet TAKE 1 TABLET(50 MG) BY MOUTH DAILY WITH OR IMMEDIATELY FOLLOWING A MEAL 03/31/22   Everrett Coombe, DO  Multiple Vitamin (MULTIVITAMIN) tablet Take 1 tablet by mouth daily.    [provider]  olmesartan (BENICAR) 5 MG tablet TAKE 1 TABLET(5 MG) BY MOUTH DAILY 03/31/22   Everrett Coombe, DO  rizatriptan (MAXALT-MLT) 5 MG disintegrating tablet DISSOLVE 1 TABLET ON THE TONGUE AT ONSET OF HEADACHE. MAY  REPEAT IN 2 HOURS AS NEEDED OR AS DIRECTED 11/04/21   Everrett Coombe, DO  Simethicone (GAS-X PO) Take by mouth as needed.    [provider]  sodium chloride (OCEAN) 0.65 % SOLN nasal spray Place 1 spray into both nostrils as needed for congestion.    [provider]  triazolam (HALCION) 0.25 MG tablet 1-2 tabs PO 2 hours before procedure or imaging.  Do not drive with this medication. 12/29/21   Monica Becton, MD    Family History Family History  Problem Relation Age of Onset   Alcohol abuse Brother    Depression Brother    Diabetes Maternal Aunt    High blood pressure Mother    Hypertension Mother    Hyperlipidemia Mother    Migraines Father    High blood pressure Father    Hypertension Father    Varicose Veins Father    High blood pressure Maternal Grandmother    High blood pressure Maternal Grandfather    High blood pressure Paternal Grandmother    Diabetes Paternal Grandfather    High blood pressure Paternal Grandfather     Social History Social History   Tobacco Use   Smoking status: Never   Smokeless tobacco: Never  Vaping Use   Vaping status: Never Used  Substance Use Topics   Alcohol use: No   Drug use: No     Allergies   Singulair [montelukast] and Sudafed [pseudoephedrine]   Review of Systems Review of Systems  Musculoskeletal:  Positive for back pain.  All other systems reviewed and are negative.    Physical Exam Triage Vital Signs ED Triage Vitals  Encounter Vitals Group     BP 09/16/22 0908 (!) 139/99     Systolic BP Percentile --      Diastolic BP Percentile --      Pulse Rate 09/16/22 0908 80     Resp 09/16/22 0908 16     Temp 09/16/22 0908 98.2 F (36.8 C)     Temp src --      SpO2 09/16/22 0908 98 %     Weight --      Height --      Head Circumference --      Peak Flow --      Pain Score 09/16/22 0907 7     Pain Loc --      Pain Education --      Exclude from Growth Chart --    No data  found.  Updated Vital Signs BP (!) 139/99   Pulse 80   Temp 98.2 F (36.8 C)   Resp 16   SpO2 94%      Physical Exam Vitals and nursing note reviewed.  Constitutional:      Appearance: Normal appearance. He is obese.  HENT:     Head: Normocephalic and atraumatic.     Mouth/Throat:     Mouth: Mucous membranes are moist.  Pharynx: Oropharynx is clear.  Eyes:     Extraocular Movements: Extraocular movements intact.     Conjunctiva/sclera: Conjunctivae normal.     Pupils: Pupils are equal, round, and reactive to light.  Cardiovascular:     Rate and Rhythm: Normal rate and regular rhythm.     Pulses: Normal pulses.     Heart sounds: Normal heart sounds.  Pulmonary:     Effort: Pulmonary effort is normal.     Breath sounds: Normal breath sounds. No wheezing, rhonchi or rales.  Musculoskeletal:        General: Normal range of motion.     Cervical back: Normal range of motion and neck supple.     Comments: Left-sided lumbar spine: TTP per patient, exam limited today due to pain  Skin:    General: Skin is warm and dry.  Neurological:     General: No focal deficit present.     Mental Status: He is alert and oriented to person, place, and time. Mental status is at baseline.  Psychiatric:        Mood and Affect: Mood normal.        Behavior: Behavior normal.        Thought Content: Thought content normal.      UC Treatments / Results  Labs (all labs ordered are listed, but only abnormal results are displayed) Labs Reviewed - No data to display  EKG   Radiology No results found.  Procedures Procedures (including critical care time)  Medications Ordered in UC Medications  ketorolac (TORADOL) injection 60 mg (60 mg Intramuscular Given 09/16/22 1018)  dexamethasone (DECADRON) injection 10 mg (10 mg Intramuscular Given 09/16/22 1019)    Initial Impression / Assessment and Plan / UC Course  I have reviewed the triage vital signs and the nursing notes.  Pertinent  labs & imaging results that were available during my care of the patient were reviewed by me and considered in my medical decision making (see chart for details).     MDM: 1.  Chronic low back pain with sciatica, sciatica laterality unspecified, on specified back pain laterally 2.  Strain of lumbar region, initial encounter-IM Toradol 60 mg and IM Decadron 10 mg given once in clinic and prior to discharge today. Patient to take medication as directed with food to completion.  Advised patient to avoid offending activities of affected area of lower back for the next 7 to 10 days.  Advised/encouraged patient to follow-up with PCP for further evaluation of ongoing lower back pain with possible physical therapy evaluation.  Encouraged increase daily water intake to 64 ounces per day while taking this medication.  Advised if symptoms worsen and/or unresolved please follow-up with PCP or here for further evaluation.  Final Clinical Impressions(s) / UC Diagnoses   Final diagnoses:  Chronic low back pain with sciatica, sciatica laterality unspecified, unspecified back pain laterality  Strain of lumbar region, initial encounter     Discharge Instructions      Patient to take medication as directed with food to completion.  Advised patient to avoid offending activities of affected area of lower back for the next 7 to 10 days.  Advised/encouraged patient to follow-up with PCP for further evaluation of ongoing lower back pain with possible physical therapy evaluation.  Encouraged increase daily water intake to 64 ounces per day while taking this medication.  Advised if symptoms worsen and/or unresolved please follow-up with PCP or here for further evaluation.     ED Prescriptions  Medication Sig Dispense Auth. Provider   celecoxib (CELEBREX) 200 MG capsule Take 1 capsule (200 mg total) by mouth daily for 15 days. 15 capsule Trevor Iha, FNP      PDMP not reviewed this encounter.   Trevor Iha, FNP 09/16/22 1134

## 2022-09-16 NOTE — Discharge Instructions (Addendum)
Patient to take medication as directed with food to completion.  Advised patient to avoid offending activities of affected area of lower back for the next 7 to 10 days.  Advised/encouraged patient to follow-up with PCP for further evaluation of ongoing lower back pain with possible physical therapy evaluation.  Encouraged increase daily water intake to 64 ounces per day while taking this medication.  Advised if symptoms worsen and/or unresolved please follow-up with PCP or here for further evaluation.

## 2022-09-18 ENCOUNTER — Encounter: Payer: Self-pay | Admitting: Family Medicine

## 2022-09-19 NOTE — Progress Notes (Signed)
Brendan Becton, MD  [name] We were just going to do our open MRI downstairs with high-dose oral triazolam sedation, no anesthesia consultation needed for that.

## 2022-09-19 NOTE — Telephone Encounter (Signed)
Patient scheduled.

## 2022-09-21 ENCOUNTER — Telehealth (INDEPENDENT_AMBULATORY_CARE_PROVIDER_SITE_OTHER): Payer: Managed Care, Other (non HMO) | Admitting: Family Medicine

## 2022-09-21 DIAGNOSIS — Z6841 Body Mass Index (BMI) 40.0 and over, adult: Secondary | ICD-10-CM

## 2022-09-21 DIAGNOSIS — D485 Neoplasm of uncertain behavior of skin: Secondary | ICD-10-CM

## 2022-09-21 DIAGNOSIS — M5135 Other intervertebral disc degeneration, thoracolumbar region: Secondary | ICD-10-CM

## 2022-09-21 NOTE — Progress Notes (Signed)
Lower back pain has improved some.  Requesting wt loss mgmt referral. Dermatology referral for skin cancer. Open MRI?

## 2022-09-25 ENCOUNTER — Encounter: Payer: Self-pay | Admitting: Family Medicine

## 2022-09-25 NOTE — Assessment & Plan Note (Signed)
Continues to have low back pain.  Although slightly better he remains concerned.  He was unable to tolerate regular MRI.  He will check to see if this is available neurology open MRI.  We did discuss that there are is open MRI available through [facility] in [place].

## 2022-09-25 NOTE — Assessment & Plan Note (Signed)
He has a few concerns regarding skin on his face.  Referral placed to dermatology.

## 2022-09-25 NOTE — Progress Notes (Signed)
Brendan Neal - 57 y.o. male MRN [id number]  Date of birth: May 10, 1965   This visit type was conducted due to national recommendations for restrictions regarding the COVID-19 Pandemic (e.g. social distancing).  This format is felt to be most appropriate for this patient at this time.  All issues noted in this document were discussed and addressed.  No physical exam was performed (except for noted visual exam findings with Video Visits).  I discussed the limitations of evaluation and management by telemedicine and the availability of in person appointments. The patient expressed understanding and agreed to proceed.  I connected withNAME@ on 09/25/22 at  1:10 PM EDT by a video enabled telemedicine application and verified that I am speaking with the correct person using two identifiers.  Present at visit: Everrett Coombe, DO Judy Pimple   Patient Location: Home [address] [place] [address]   Provider location:   [facility]  Chief Complaint  Patient presents with   Back Pain    HPI  Brendan Neal is a 57 y.o. male who presents via audio/video conferencing for a telehealth visit today.    He has had continued back pain.  Seen by Dr. Benjamin Stain and MRI recommended previously however he could not complete this due to claustrophobia.  We discussed open MRI which may be available in [place] in the [place] area.  Offered to send order to [facility] in [place] with open MRI however he declined previously.  He was looking into the possibility of [facility] however has not contacted anyone to see if this is available.  No new or worsening symptoms from baseline.  He is concerned about weight gain.  He would need referral to healthy weight and wellness as well as options for weight management.   Requesting referral to dermatology.  Has a couple skin lesions he is concerned about.    ROS:  A comprehensive ROS was completed and negative except as noted per HPI  Past Medical History:   Diagnosis Date   Acute left lumbar radiculopathy 01/30/2019   Cervico-occipital neuralgia of left side 07/08/2015   Chest discomfort 03/01/2018   DVT (deep venous thrombosis) (HCC)    Environmental allergies 02/18/2019   Facial flushing 03/01/2018   GAD (generalized anxiety disorder) 02/17/2015   Genital warts    GERD (gastroesophageal reflux disease)    HTN (hypertension) 09/11/2014   Hypertension    Lumbar degenerative disc disease 01/30/2019   Nasal septal deviation 10/06/2016   Obese 09/23/2015   Panic attacks 02/20/2019   Seborrheic dermatitis 07/02/2015   Sinusitis, chronic 09/11/2014   Situational anxiety 09/11/2014   Snores 09/11/2014    Past Surgical History:  Procedure Laterality Date   TONSILLECTOMY     WISDOM TOOTH EXTRACTION      Family History  Problem Relation Age of Onset   Alcohol abuse Brother    Depression Brother    Diabetes Maternal Aunt    High blood pressure Mother    Hypertension Mother    Hyperlipidemia Mother    Migraines Father    High blood pressure Father    Hypertension Father    Varicose Veins Father    High blood pressure Maternal Grandmother    High blood pressure Maternal Grandfather    High blood pressure Paternal Grandmother    Diabetes Paternal Grandfather    High blood pressure Paternal Grandfather     Social History   Socioeconomic History   Marital status: Married    Spouse name: Not on file  Number of children: Not on file   Years of education: Not on file   Highest education level: Not on file  Occupational History   Not on file  Tobacco Use   Smoking status: Never   Smokeless tobacco: Never  Vaping Use   Vaping status: Never Used  Substance and Sexual Activity   Alcohol use: No   Drug use: No   Sexual activity: Yes  Other Topics Concern   Not on file  Social History Narrative   Not on file   Social Determinants of Health   Financial Resource Strain: Not on file  Food Insecurity: Not on file  Transportation  Needs: Not on file  Physical Activity: Not on file  Stress: Not on file  Social Connections: Not on file  Intimate Partner Violence: Not on file     Current Outpatient Medications:    ALPRAZolam (XANAX) 0.5 MG tablet, Take 1 hour prior to flight.  May repeat if needing during flight., Disp: 5 tablet, Rfl: 0   aspirin EC 81 MG tablet, Take 81 mg by mouth daily. Swallow whole., Disp: , Rfl:    Calcium Carbonate Antacid (TUMS PO), Take by mouth daily as needed. , Disp: , Rfl:    celecoxib (CELEBREX) 200 MG capsule, Take 1 capsule (200 mg total) by mouth daily for 15 days., Disp: 15 capsule, Rfl: 0   fluticasone (FLONASE) 50 MCG/ACT nasal spray, Place 1 spray into both nostrils daily as needed for allergies or rhinitis., Disp: , Rfl:    Homeopathic Products (ARNICARE) GEL, , Disp: , Rfl:    hydrOXYzine (ATARAX/VISTARIL) 25 MG tablet, TAKE 1 TABLET(25 MG) BY MOUTH EVERY 8 HOURS AS NEEDED FOR ANXIETY, Disp: 90 tablet, Rfl: 1   loratadine (CLARITIN) 10 MG tablet, Take 10 mg by mouth daily as needed. , Disp: , Rfl:    metoprolol succinate (TOPROL-XL) 50 MG 24 hr tablet, TAKE 1 TABLET(50 MG) BY MOUTH DAILY WITH OR IMMEDIATELY FOLLOWING A MEAL, Disp: 90 tablet, Rfl: 3   Multiple Vitamin (MULTIVITAMIN) tablet, Take 1 tablet by mouth daily., Disp: , Rfl:    olmesartan (BENICAR) 5 MG tablet, TAKE 1 TABLET(5 MG) BY MOUTH DAILY, Disp: 90 tablet, Rfl: 3   rizatriptan (MAXALT-MLT) 5 MG disintegrating tablet, DISSOLVE 1 TABLET ON THE TONGUE AT ONSET OF HEADACHE. MAY REPEAT IN 2 HOURS AS NEEDED OR AS DIRECTED, Disp: 10 tablet, Rfl: 0   Simethicone (GAS-X PO), Take by mouth as needed., Disp: , Rfl:    sodium chloride (OCEAN) 0.65 % SOLN nasal spray, Place 1 spray into both nostrils as needed for congestion., Disp: , Rfl:    triazolam (HALCION) 0.25 MG tablet, 1-2 tabs PO 2 hours before procedure or imaging.  Do not drive with this medication., Disp: 2 tablet, Rfl: 0   methocarbamol (ROBAXIN) 500 MG tablet, Take  1 tablet (500 mg total) by mouth daily as needed for muscle spasms. (Patient not taking: Reported on 09/21/2022), Disp: 20 tablet, Rfl: 0  EXAM:  VITALS per patient if applicable: BP 135/77   Pulse 69   Ht 6' (1.829 m)   Wt 295 lb (133.8 kg)   BMI 40.01 kg/m   GENERAL: alert, oriented, appears well and in no acute distress  HEENT: atraumatic, conjunttiva clear, no obvious abnormalities on inspection of external nose and ears  NECK: normal movements of the head and neck  LUNGS: on inspection no signs of respiratory distress, breathing rate appears normal, no obvious gross SOB, gasping or wheezing  CV: no obvious cyanosis  MS: moves all visible extremities without noticeable abnormality  PSYCH/NEURO: pleasant and cooperative, no obvious depression or anxiety, speech and thought processing grossly intact  ASSESSMENT AND PLAN:  Discussed the following assessment and plan:  DDD (degenerative disc disease), thoracolumbar Continues to have low back pain.  Although slightly better he remains concerned.  He was unable to tolerate regular MRI.  He will check to see if this is available neurology open MRI.  We did discuss that there are is open MRI available through [facility] in [place].  Morbid obesity (HCC) Referral placed to healthy weight and wellness.  Neoplasm of uncertain behavior of skin He has a few concerns regarding skin on his face.  Referral placed to dermatology.     I discussed the assessment and treatment plan with the patient. The patient was provided an opportunity to ask questions and all were answered. The patient agreed with the plan and demonstrated an understanding of the instructions.   The patient was advised to call back or seek an in-person evaluation if the symptoms worsen or if the condition fails to improve as anticipated.    Everrett Coombe, DO

## 2022-09-25 NOTE — Assessment & Plan Note (Addendum)
Referral placed to healthy weight and wellness

## 2022-09-26 NOTE — Therapy (Signed)
OUTPATIENT PHYSICAL THERAPY THORACOLUMBAR EVALUATION   Patient Name: Brendan Neal MRN: [id number] DOB:10/10/1965, 57 y.o., male Today's Date: 09/27/2022  END OF SESSION:  PT End of Session - 09/27/22 1057     Visit Number 1    Number of Visits 13    Date for PT Re-Evaluation 11/17/22    Authorization Type [employer] MANAGED    PT Start Time 0803    PT Stop Time 0849    PT Time Calculation (min) 46 min    Activity Tolerance Patient tolerated treatment well    Behavior During Therapy [facility] for tasks assessed/performed             Past Medical History:  Diagnosis Date   Acute left lumbar radiculopathy 01/30/2019   Cervico-occipital neuralgia of left side 07/08/2015   Chest discomfort 03/01/2018   DVT (deep venous thrombosis) (HCC)    Environmental allergies 02/18/2019   Facial flushing 03/01/2018   GAD (generalized anxiety disorder) 02/17/2015   Genital warts    GERD (gastroesophageal reflux disease)    HTN (hypertension) 09/11/2014   Hypertension    Lumbar degenerative disc disease 01/30/2019   Nasal septal deviation 10/06/2016   Obese 09/23/2015   Panic attacks 02/20/2019   Seborrheic dermatitis 07/02/2015   Sinusitis, chronic 09/11/2014   Situational anxiety 09/11/2014   Snores 09/11/2014   Past Surgical History:  Procedure Laterality Date   TONSILLECTOMY     WISDOM TOOTH EXTRACTION     Patient Active Problem List   Diagnosis Date Noted   Morbid obesity (HCC) 09/25/2022   Cough 01/11/2022   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   Colon cancer screening 04/03/2021   Neoplasm of uncertain behavior of skin 04/03/2021   Left ankle pain 09/22/2019   Panic attacks 02/20/2019   Environmental allergies 02/18/2019   DDD (degenerative disc disease), thoracolumbar 01/30/2019   Nasal septal deviation 10/06/2016   Obese 09/23/2015   GAD (generalized anxiety disorder) 02/17/2015   HTN (hypertension) 09/11/2014   Snores 09/11/2014   Situational anxiety 09/11/2014   Sinusitis, chronic  09/11/2014   GERD (gastroesophageal reflux disease) 09/11/2014    PCP: Everrett Coombe, DO  REFERRING PROVIDER: Charlton Amor, DO   REFERRING DIAG: [phone] (ICD-10-CM) - Chronic low back pain with sciatica, sciatica laterality unspecified, unspecified back pain laterality   Rationale for Evaluation and Treatment: Rehabilitation  THERAPY DIAG:  Chronic bilateral low back pain with left-sided sciatica  Muscle weakness (generalized)  Difficulty in walking, not elsewhere classified  ONSET DATE: Chronic approx 4 years   SUBJECTIVE:  SUBJECTIVE STATEMENT: Pt reports developing low back pain approx 4 years ago when 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 Specifically, he stood up from a chair one day and experienced significant low back pain. This pain improved and his back did reasonably well with periods of discomfort until about 2 months ago. During this recent time frame, he has experienced 2 episodes of back pain. With the first episode, he had radicular pain down the back of his L leg, and with the 2nd, pain down the anterior L leg c N/T. With both episodes he sought care from an urgent care center, and received 2 shots on the 2nd visit, a steroid and toradol, which helped to decrease the pain. Celebrex helps to manage the pain, and he takes robaxin sparingly. Standing tolerance = 5-15 mins; Walking tolerance 10-15 mins. Additionally, the pt has started PT at another facility, and he is trying to decide where he wants to receive care. The pt understands his insurance may not covered services from both facilities.  PERTINENT HISTORY:  Lumbar degenerative disc disease, high BMI, GAD   PAIN:  Are you having pain? Yes: NPRS scale:  2/10 Pain location: L low back c L radicular pain to knee Pain description: ache, sharp Aggravating factors: sit to stand, standing, walking Relieving factors: Sitting, changing positions 1-9/10  PRECAUTIONS: None  RED FLAGS: None   WEIGHT BEARING RESTRICTIONS: No  FALLS:  Has patient fallen in last 6 months? 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 Alignment is normal. Intervertebral disc spaces are maintained. Minimal facet arthropathy of the lower lumbar spine.   IMPRESSION: Minimal facet arthropathy of the lower lumbar spine. No acute findings.  PATIENT SURVEYS:  FOTO: Perceived function   34%, predicted   68%   SCREENING FOR RED FLAGS: Bowel or bladder incontinence: No Spinal tumors: No Cauda equina syndrome: No Compression fracture: No  COGNITION: Overall cognitive status: Within functional limits for tasks assessed     SENSATION: Light touch: Impaired  L anterior thigh  MUSCLE LENGTH: Hamstrings: Right tight deg; Left tight deg Thomas test: Right NT deg; Left NT deg  POSTURE: rounded shoulders, forward head, decreased lumbar lordosis, and increased thoracic kyphosis  PALPATION: NT  LUMBAR ROM:   AROM eval  Flexion Min limitation, distal 1/3 rd shin. Pulling and tightness of low back  Extension Full. Pain low back, L>R  Right lateral flexion Min limitation, mid knee. Pulling and tightness of L low back  Left lateral flexion Min limitation, mid knee. Tightness and pain of L low back  Right rotation Full. Pulling and tightness of  low back  Left rotation Full. Pulling and tightness of low back   (Blank rows = not tested)  LOWER EXTREMITY ROM:     Grossly WNLs Active  Right eval Left eval  Hip flexion    Hip extension    Hip abduction    Hip adduction    Hip internal rotation    Hip external rotation    Knee flexion    Knee extension  Ankle dorsiflexion    Ankle plantarflexion    Ankle inversion    Ankle eversion     (Blank rows = not tested)  LOWER EXTREMITY MMT:    Myotome screen was negative with L nad R LE strength equal. Weak core.  MMT Right eval Left eval  Hip flexion    Hip extension    Hip abduction    Hip adduction    Hip internal rotation    Hip external rotation    Knee flexion    Knee extension    Ankle dorsiflexion    Ankle plantarflexion    Ankle inversion    Ankle eversion     (Blank rows = not tested)  LUMBAR SPECIAL TESTS:  Straight leg raise test: Positive and Slump test: Negative  SLR positive R LE  FUNCTIONAL TESTS:  NT  GAIT: Distance walked: 200' Assistive device utilized: None Level of assistance: Complete Independence Comments: decreased pace  TODAY'S TREATMENT:   [facility] Adult PT Treatment:                                                DATE: 09/27/22 Therapeutic Exercise: Developed, instructed in, and pt completed therex as noted in HEP  Self Care: Sleeping positions for comfort. Proper technique for turning and supine to sit                                                                                                                             PATIENT EDUCATION:  Education details: Eval findings, POC, HEP, self care Person educated: Patient Education method: Explanation, Demonstration, Tactile cues, Verbal cues, and Handouts Education comprehension: verbalized understanding, returned demonstration, verbal cues required, tactile cues required, and needs further education  HOME EXERCISE PROGRAM: Access Code: [email or web address] URL:  [email or web address] Date: 09/27/2022 Prepared by: Joellyn Rued  Exercises - Seated Sciatic Tensioner  - 2-3 x daily - 7 x weekly - 1 sets - 10 reps - 1 hold - Seated Flexion Stretch  - 2-3 x daily - 7 x weekly - 1 sets - 3 reps - 20 hold - Hooklying Single Knee to Chest Stretch  - 2-3 x daily - 7 x weekly - 1 sets - 3 reps - 20 hold - Supine Posterior Pelvic Tilt  - 2-3 x daily - 7 x weekly - 1 sets - 10 reps - 3 hold  ASSESSMENT:  CLINICAL IMPRESSION: Patient is a 57 y.o. male who was seen today for physical therapy evaluation and treatment for M54.40,G89.29 (ICD-10-CM) - Chronic low back pain with sciatica, sciatica laterality unspecified, unspecified back pain laterality . Pt presents to PT with min decreased lumbar ROM, L low back pain and LE concordant pain with extension and L side bending, L LE radicualr pain with N/T which is provoked with a SLR, and a weak care.  Pt will benefit from skilled PT to address impairments to optimize back function with less pain.   OBJECTIVE IMPAIRMENTS: decreased activity tolerance, difficulty walking, decreased ROM, impaired flexibility, postural dysfunction, obesity, and pain.   ACTIVITY LIMITATIONS: carrying, lifting, bending, sitting, standing, squatting, sleeping, stairs, and locomotion level  PARTICIPATION LIMITATIONS: meal prep, cleaning, laundry, driving, shopping, and occupation  PERSONAL FACTORS: Fitness, Past/current experiences, Time since onset of injury/illness/exacerbation, and 1-2 comorbidities: high BMI and DDD  are also affecting patient's functional outcome.   REHAB POTENTIAL: Good  CLINICAL DECISION MAKING: Stable/uncomplicated  EVALUATION COMPLEXITY: Low   GOALS:  SHORT TERM GOALS: Target date: 10/20/22  Pt will be Ind in an initial HEP  Baseline: started Goal status: INITIAL  2.  Pt will voice understanding of measures to assist in pain reduction  Baseline: sleeping positions, supine to sitting Goal  status: INITIAL  LONG TERM GOALS: Target date: 11/17/22  Pt will be Ind in a final HEP to maintain achieved LOF  Baseline: started Goal status: INITIAL  2.  Pt will report a 50% or greater improvement in pain with daily activities for improved function and QOL Baseline: 1-9/10 Goal status: INITIAL  3.  Pt will demonstrate improved core strength, maintain a bridge for 60" and a forward plank from knees for 30" as demonstration Baseline:  Goal status: INITIAL  4.  Pt's FOTO score will improved to the predicted value of 68% as indication of improved function  Baseline: 34% Goal status: INITIAL   PLAN:  PT FREQUENCY: 2x/week  PT DURATION: 6 weeks  PLANNED INTERVENTIONS: Therapeutic exercises, Therapeutic activity, Patient/Family education, Self Care, Joint mobilization, Aquatic Therapy, Dry Needling, Electrical stimulation, Spinal mobilization, Cryotherapy, Moist heat, Taping, Traction, Ultrasound, Ionotophoresis 4mg /ml Dexamethasone, Manual therapy, and Re-evaluation.  PLAN FOR NEXT SESSION: Review FOTO; assess response to HEP; progress therex as indicated; use of modalities, manual therapy; and TPDN as indicated.  Rebekkah Powless MS, PT 09/27/22 1:28 PM

## 2022-09-27 ENCOUNTER — Other Ambulatory Visit: Payer: Self-pay

## 2022-09-27 ENCOUNTER — Ambulatory Visit: Payer: Managed Care, Other (non HMO) | Attending: Family Medicine

## 2022-09-27 DIAGNOSIS — R262 Difficulty in walking, not elsewhere classified: Secondary | ICD-10-CM | POA: Diagnosis present

## 2022-09-27 DIAGNOSIS — G8929 Other chronic pain: Secondary | ICD-10-CM | POA: Insufficient documentation

## 2022-09-27 DIAGNOSIS — M6281 Muscle weakness (generalized): Secondary | ICD-10-CM | POA: Insufficient documentation

## 2022-09-27 DIAGNOSIS — M5442 Lumbago with sciatica, left side: Secondary | ICD-10-CM | POA: Diagnosis present

## 2022-09-27 DIAGNOSIS — M5440 Lumbago with sciatica, unspecified side: Secondary | ICD-10-CM | POA: Diagnosis not present

## 2022-10-11 ENCOUNTER — Ambulatory Visit: Payer: Managed Care, Other (non HMO)

## 2022-10-13 ENCOUNTER — Ambulatory Visit: Payer: Managed Care, Other (non HMO)

## 2023-03-01 ENCOUNTER — Ambulatory Visit (INDEPENDENT_AMBULATORY_CARE_PROVIDER_SITE_OTHER): Payer: Managed Care, Other (non HMO) | Admitting: Internal Medicine

## 2023-03-01 ENCOUNTER — Encounter (INDEPENDENT_AMBULATORY_CARE_PROVIDER_SITE_OTHER): Payer: Self-pay | Admitting: Internal Medicine

## 2023-03-01 VITALS — BP 122/82 | HR 99 | Temp 97.9°F | Ht 73.0 in | Wt 293.0 lb

## 2023-03-01 DIAGNOSIS — Z0289 Encounter for other administrative examinations: Secondary | ICD-10-CM

## 2023-03-01 DIAGNOSIS — R638 Other symptoms and signs concerning food and fluid intake: Secondary | ICD-10-CM | POA: Diagnosis not present

## 2023-03-01 DIAGNOSIS — D751 Secondary polycythemia: Secondary | ICD-10-CM | POA: Insufficient documentation

## 2023-03-01 DIAGNOSIS — E66812 Obesity, class 2: Secondary | ICD-10-CM

## 2023-03-01 DIAGNOSIS — R29818 Other symptoms and signs involving the nervous system: Secondary | ICD-10-CM | POA: Diagnosis not present

## 2023-03-01 DIAGNOSIS — Z6838 Body mass index (BMI) 38.0-38.9, adult: Secondary | ICD-10-CM

## 2023-03-01 NOTE — Progress Notes (Signed)
 Office: [phone]  /  Fax: [phone]   Initial Visit  Brendan Neal was seen in clinic today to evaluate for obesity. He is interested in losing weight to improve overall health and reduce the risk of weight related complications. He presents today to review program treatment options, initial physical assessment, and evaluation.     He was referred by: PCP  When asked what else they would like to accomplish? He states: Adopt healthier eating patterns, Improve quality of life, Improve appearance, and Improve self-confidence  Weight history: Started gain weight when he became more sedentary , working from home.   When asked how has your weight affected you? He states: Having fatigue, Having poor endurance, and Problems with eating patterns  Some associated conditions: Prediabetes  Contributing factors: Family history of obesity, Disruption of circadian rhythm / sleep disordered breathing, Use of obesogenic medications: Beta-blockers, Reduced physical activity, Eating patterns, and Strong orexigenic signaling and inadequate inhibitory control . High Stress levels - Architectural technologist, single income, planning of retirement. POA, mother has short life expectancy.  Weight promoting medications identified: Beta-blockers  Current nutrition plan: None  Current level of physical activity: None  Current or previous pharmacotherapy: Is interested in pharmacotherapy  Response to medication: Never tried medications   Past medical history includes:   Past Medical History:  Diagnosis Date   Acute left lumbar radiculopathy 01/30/2019   Cervico-occipital neuralgia of left side 07/08/2015   Chest discomfort 03/01/2018   DVT (deep venous thrombosis) (HCC)    Environmental allergies 02/18/2019   Facial flushing 03/01/2018   GAD (generalized anxiety disorder) 02/17/2015   Genital warts    GERD (gastroesophageal reflux disease)    HTN (hypertension) 09/11/2014   Hypertension    Lumbar degenerative  disc disease 01/30/2019   Nasal septal deviation 10/06/2016   Obese 09/23/2015   Panic attacks 02/20/2019   Seborrheic dermatitis 07/02/2015   Sinusitis, chronic 09/11/2014   Situational anxiety 09/11/2014   Snores 09/11/2014     Objective:   BP 122/82   Pulse 99   Temp 97.9 F (36.6 C)   Ht 6' 1 (1.854 m)   Wt 293 lb (132.9 kg)   SpO2 94%   BMI 38.66 kg/m  He was weighed on the bioimpedance scale: Body mass index is 38.66 kg/m.  Peak Weight:298 , Body Fat%:38, Visceral Fat Rating:23, Weight trend over the last 12 months: Decreasing  General:  Alert, oriented and cooperative. Patient is in no acute distress.  Respiratory: Normal respiratory effort, no problems with respiration noted   Gait: able to ambulate independently  Mental Status: Normal mood and affect. Normal behavior. Normal judgment and thought content.   DIAGNOSTIC DATA REVIEWED:  BMET    Component Value Date/Time   NA 140 10/03/2019 0551   K 4.0 10/03/2019 0551   CL 104 10/03/2019 0551   CO2 25 10/03/2019 0551   GLUCOSE 107 (H) 10/03/2019 0551   BUN 13 10/03/2019 0551   CREATININE 1.08 10/03/2019 0551   CREATININE 1.21 04/02/2018 0720   CALCIUM  9.5 10/03/2019 0551   GFRNONAA >60 10/03/2019 0551   GFRNONAA 69 04/02/2018 0720   GFRAA >60 10/03/2019 0551   GFRAA 80 04/02/2018 0720   No results found for: HGBA1C No results found for: INSULIN CBC    Component Value Date/Time   WBC 7.6 10/03/2019 0551   RBC 5.73 10/03/2019 0551   HGB 17.3 (H) 10/03/2019 0551   HCT 52.4 (H) 10/03/2019 0551   PLT 209 10/03/2019 0551  MCV 91.4 10/03/2019 0551   MCH 30.2 10/03/2019 0551   MCHC 33.0 10/03/2019 0551   RDW 12.3 10/03/2019 0551   Iron/TIBC/Ferritin/ %Sat No results found for: IRON, TIBC, FERRITIN, IRONPCTSAT Lipid Panel     Component Value Date/Time   CHOL 175 04/02/2018 0720   TRIG 109 04/02/2018 0720   HDL 56 04/02/2018 0720   CHOLHDL 3.1 04/02/2018 0720   LDLCALC 98 04/02/2018 0720    LDLDIRECT 98 09/11/2014 0943   Hepatic Function Panel     Component Value Date/Time   PROT 6.7 10/03/2019 0551   ALBUMIN 3.8 10/03/2019 0551   AST 20 10/03/2019 0551   ALT 23 10/03/2019 0551   ALKPHOS 73 10/03/2019 0551   BILITOT 0.8 10/03/2019 0551      Component Value Date/Time   TSH 3.19 04/02/2018 0720     Assessment and Plan:   Abnormal craving Assessment & Plan: Patient acknowledges having cravings throughout the day this may be psychological or neurohormonal.  He is at risk for leptin resistance and also hyperinsulinemia.  He will be tested for the latter at intake labs.  We will consider referral to behavioral health specialist if necessary particularly if emotional eating is identified.   Suspected sleep apnea Assessment & Plan: Patient was provided with information regarding symptoms of sleep apnea as well as effects on weight management.  He has multiple symptoms of sleep disordered breathing.  He also had a CBC in 2021 that showed hemoglobin of 17 which may be a sign of nocturnal hypoxemia.  He has a high risk phenotype and benefits from polysomnography.  We will consider referral at his intake appointment.   Polycythemia Assessment & Plan: Based on a CBC from 2021.  We will repeat his CBC at into appointment and if elevated further workup will be pursued.  I suspect he may have sleep disordered breathing.  He denies use of testosterone supplements.  He would benefit from polysomnography.   Class 2 obesity without serious comorbidity with body mass index (BMI) of 38.0 to 38.9 in adult, unspecified obesity type Assessment & Plan: We reviewed anthropometrics, biometrics, associated medical conditions and contributing factors with patient. he would benefit from a medically tailored reduced calorie nutrional plan based on his REE (resting energy expenditure), which will be determined by indirect calorimetry.  We will also assess for cardiometabolic risk and nutritional  derangements via fasting labs at intake appointment.           Obesity Treatment / Action Plan:  Patient will work on garnering support from family and friends to begin weight loss journey. Will work on eliminating or reducing the presence of highly palatable, calorie dense foods in the home. Will complete provided nutritional and psychosocial assessment questionnaire before the next appointment. Will be scheduled for indirect calorimetry to determine resting energy expenditure in a fasting state.  This will allow us  to create a reduced calorie, high-protein meal plan to promote loss of fat mass while preserving muscle mass. Counseled on the health benefits of losing 5%-15% of total body weight. Was counseled on nutritional approaches to weight loss and benefits of reducing processed foods and consuming plant-based foods and high quality protein as part of nutritional weight management. Was counseled on pharmacotherapy and role as an adjunct in weight management.   Obesity Education Performed Today:  He was weighed on the bioimpedance scale and results were discussed and documented in the synopsis.  We discussed obesity as a disease and the importance of a more  detailed evaluation of all the factors contributing to the disease.  We discussed the importance of long term lifestyle changes which include nutrition, exercise and behavioral modifications as well as the importance of customizing this to his specific health and social needs.  We discussed the benefits of reaching a healthier weight to alleviate the symptoms of existing conditions and reduce the risks of the biomechanical, metabolic and psychological effects of obesity.  Brendan Neal appears to be in the action stage of change and states they are ready to start intensive lifestyle modifications and behavioral modifications.  I have spent 45 minutes in the care of the patient today including: preparing to see patient (e.g. review  and interpretation of tests, old notes ), obtaining and/or reviewing separately obtained history, performing a medically appropriate examination or evaluation, counseling and educating the patient, documenting clinical information in the electronic or other health care record, and independently interpreting results and communicating results to the patient, family, or caregiver   Reviewed by clinician on day of visit: allergies, medications, problem list, medical history, surgical history, family history, social history, and previous encounter notes pertinent to obesity diagnosis.   Lucas Parker, MD

## 2023-03-01 NOTE — Assessment & Plan Note (Signed)
 Based on a CBC from 2021.  We will repeat his CBC at into appointment and if elevated further workup will be pursued.  I suspect he may have sleep disordered breathing.  He denies use of testosterone supplements.  He would benefit from polysomnography.

## 2023-03-01 NOTE — Assessment & Plan Note (Signed)
 Patient was provided with information regarding symptoms of sleep apnea as well as effects on weight management.  He has multiple symptoms of sleep disordered breathing.  He also had a CBC in 2021 that showed hemoglobin of 17 which may be a sign of nocturnal hypoxemia.  He has a high risk phenotype and benefits from polysomnography.  We will consider referral at his intake appointment.

## 2023-03-01 NOTE — Assessment & Plan Note (Signed)
 Patient acknowledges having cravings throughout the day this may be psychological or neurohormonal.  He is at risk for leptin resistance and also hyperinsulinemia.  He will be tested for the latter at intake labs.  We will consider referral to behavioral health specialist if necessary particularly if emotional eating is identified.

## 2023-03-01 NOTE — Assessment & Plan Note (Signed)
 We reviewed anthropometrics, biometrics, associated medical conditions and contributing factors with patient. he would benefit from a medically tailored reduced calorie nutrional plan based on his REE (resting energy expenditure), which will be determined by indirect calorimetry.  We will also assess for cardiometabolic risk and nutritional derangements via fasting labs at intake appointment.

## 2023-03-06 ENCOUNTER — Ambulatory Visit (INDEPENDENT_AMBULATORY_CARE_PROVIDER_SITE_OTHER): Payer: Managed Care, Other (non HMO) | Admitting: Family Medicine

## 2023-03-20 ENCOUNTER — Ambulatory Visit (INDEPENDENT_AMBULATORY_CARE_PROVIDER_SITE_OTHER): Payer: Managed Care, Other (non HMO) | Admitting: Family Medicine

## 2023-03-27 ENCOUNTER — Other Ambulatory Visit: Payer: Self-pay

## 2023-03-27 DIAGNOSIS — I10 Essential (primary) hypertension: Secondary | ICD-10-CM

## 2023-03-27 MED ORDER — METOPROLOL SUCCINATE ER 50 MG PO TB24: ORAL_TABLET | ORAL | 3 refills | Status: DC

## 2023-03-27 MED ORDER — OLMESARTAN MEDOXOMIL 5 MG PO TABS: ORAL_TABLET | ORAL | 3 refills | Status: DC

## 2023-04-09 ENCOUNTER — Ambulatory Visit (INDEPENDENT_AMBULATORY_CARE_PROVIDER_SITE_OTHER): Payer: Managed Care, Other (non HMO) | Admitting: Family Medicine

## 2023-04-23 ENCOUNTER — Ambulatory Visit (INDEPENDENT_AMBULATORY_CARE_PROVIDER_SITE_OTHER): Payer: Managed Care, Other (non HMO) | Admitting: Family Medicine

## 2023-05-15 ENCOUNTER — Ambulatory Visit (INDEPENDENT_AMBULATORY_CARE_PROVIDER_SITE_OTHER): Payer: Managed Care, Other (non HMO) | Admitting: Family Medicine

## 2023-05-29 ENCOUNTER — Ambulatory Visit (INDEPENDENT_AMBULATORY_CARE_PROVIDER_SITE_OTHER): Payer: Managed Care, Other (non HMO) | Admitting: Family Medicine

## 2023-05-30 ENCOUNTER — Emergency Department (HOSPITAL_COMMUNITY)
Admission: EM | Admit: 2023-05-30 | Discharge: 2023-05-31 | Disposition: A | Discharge status: Home / Self Care | Attending: Emergency Medicine | Admitting: Emergency Medicine

## 2023-05-30 ENCOUNTER — Other Ambulatory Visit: Payer: Self-pay

## 2023-05-30 ENCOUNTER — Emergency Department (HOSPITAL_COMMUNITY)

## 2023-05-30 DIAGNOSIS — R42 Dizziness and giddiness: Secondary | ICD-10-CM | POA: Diagnosis present

## 2023-05-30 DIAGNOSIS — Z7982 Long term (current) use of aspirin: Secondary | ICD-10-CM | POA: Insufficient documentation

## 2023-05-30 LAB — BASIC METABOLIC PANEL WITH GFR
Anion gap: 8 (ref 5–15)
BUN: 17 mg/dL (ref 6–20)
CO2: 25 mmol/L (ref 22–32)
Calcium: 8.8 mg/dL — ABNORMAL LOW (ref 8.9–10.3)
Chloride: 103 mmol/L (ref 98–111)
Creatinine, Ser: 1.01 mg/dL (ref 0.61–1.24)
GFR, Estimated: >60 mL/min (ref >60)
Glucose, Bld: 100 mg/dL — ABNORMAL HIGH (ref 70–99)
Potassium: 3.9 mmol/L (ref 3.5–5.1)
Sodium: 136 mmol/L (ref 135–145)

## 2023-05-30 LAB — CBC
HCT: 49.4 % (ref 39.0–52.0)
Hemoglobin: 16 g/dL (ref 13.0–17.0)
MCH: 30.4 pg (ref 26.0–34.0)
MCHC: 32.4 g/dL (ref 30.0–36.0)
MCV: 93.9 fL (ref 80.0–100.0)
Platelets: 216 10*3/uL (ref 150–400)
RBC: 5.26 MIL/uL (ref 4.22–5.81)
RDW: 12.6 % (ref 11.5–15.5)
WBC: 7.1 10*3/uL (ref 4.0–10.5)
nRBC: 0 % (ref 0.0–0.2)

## 2023-05-30 LAB — URINALYSIS, ROUTINE W REFLEX MICROSCOPIC
Bilirubin Urine: NEGATIVE
Glucose, UA: NEGATIVE mg/dL
Hgb urine dipstick: NEGATIVE
Ketones, ur: NEGATIVE mg/dL
Leukocytes,Ua: NEGATIVE
Nitrite: NEGATIVE
Protein, ur: NEGATIVE mg/dL
Specific Gravity, Urine: 1.012 (ref 1.005–1.030)
pH: 6 (ref 5.0–8.0)

## 2023-05-30 LAB — TROPONIN I (HIGH SENSITIVITY): Troponin I (High Sensitivity): <2 ng/L (ref <18)

## 2023-05-30 LAB — CBG MONITORING, ED: Glucose-Capillary: 97 mg/dL (ref 70–99)

## 2023-05-30 MED ORDER — CALCIUM CARBONATE ANTACID 500 MG PO CHEW
2.0000 | CHEWABLE_TABLET | Freq: Once | ORAL | Status: AC
  Administered 2023-05-30: 400 mg via ORAL
  Filled 2023-05-30: qty 2

## 2023-05-30 NOTE — ED Provider Notes (Signed)
 [place] EMERGENCY DEPARTMENT AT [facility] Provider Note   CSN: [id number] Arrival date & time: 05/30/23  1545     History  Chief Complaint  Patient presents with   Dizziness    Brendan Neal is a 58 y.o. male.  Patient complains of dizziness today.  Patient reports around 1:00 he noted his blood pressure was getting higher and higher.  Patient states that he hopes that he is just having an anxiety attack but he was concerned about his heart.  Patient reports that he has had recent increase in stress and poor sleep due to work and stress.  Patient reports he is on medication for his blood pressure.  Patient took his blood pressure medicine last night.  Patient states he did have some discomfort in his chest this a.m.  Patient denies any fever or chills he denies any cough or congestion.  He denies any current chest pain no abdominal pain.  Patient denies any facial weakness he denies any weakness in any extremities.  The history is provided by the patient. No language interpreter was used.  Dizziness Quality:  Lightheadedness Severity:  Moderate Onset quality:  Sudden Timing:  Constant Progression:  Worsening Chronicity:  New Relieved by:  Nothing Worsened by:  Nothing Ineffective treatments:  None tried Associated symptoms: no headaches, no hearing loss, no nausea and no vision changes        Home Medications Prior to Admission medications   Medication Sig Start Date End Date Taking? Authorizing Provider  ALPRAZolam Prudy Feeler) 0.5 MG tablet Take 1 hour prior to flight.  May repeat if needing during flight. 06/27/21   Everrett Coombe, DO  aspirin EC 81 MG tablet Take 81 mg by mouth daily. Swallow whole.    [provider]  Calcium Carbonate Antacid (TUMS PO) Take by mouth daily as needed.     [provider]  fluticasone (FLONASE) 50 MCG/ACT nasal spray Place 1 spray into both nostrils daily as needed for allergies or rhinitis.    [provider]  Homeopathic Products (ARNICARE) GEL  12/26/21   [provider]  hydrOXYzine (ATARAX/VISTARIL) 25 MG tablet TAKE 1 TABLET(25 MG) BY MOUTH EVERY 8 HOURS AS NEEDED FOR ANXIETY 02/05/20   Sunnie Nielsen, DO  loratadine (CLARITIN) 10 MG tablet Take 10 mg by mouth daily as needed.     [provider]  methocarbamol (ROBAXIN) 500 MG tablet Take 1 tablet (500 mg total) by mouth daily as needed for muscle spasms. 08/14/22   Trevor Iha, FNP  metoprolol succinate (TOPROL-XL) 50 MG 24 hr tablet TAKE 1 TABLET(50 MG) BY MOUTH DAILY WITH OR IMMEDIATELY FOLLOWING A MEAL 03/27/23   Everrett Coombe, DO  Multiple Vitamin (MULTIVITAMIN) tablet Take 1 tablet by mouth daily.    [provider]  olmesartan (BENICAR) 5 MG tablet TAKE 1 TABLET(5 MG) BY MOUTH DAILY 03/27/23   Everrett Coombe, DO  rizatriptan (MAXALT-MLT) 5 MG disintegrating tablet DISSOLVE 1 TABLET ON THE TONGUE AT ONSET OF HEADACHE. MAY REPEAT IN 2 HOURS AS NEEDED OR AS DIRECTED 11/04/21   Everrett Coombe, DO  Simethicone (GAS-X PO) Take by mouth as needed.    [provider]  sodium chloride (OCEAN) 0.65 % SOLN nasal spray Place 1 spray into both nostrils as needed for congestion.    [provider]  triazolam (HALCION) 0.25 MG tablet 1-2 tabs PO 2 hours before procedure or imaging.  Do not drive with this medication. 12/29/21   Monica Becton,  MD      Allergies    Singulair [montelukast] and Sudafed [pseudoephedrine]    Review of Systems   Review of Systems  HENT:  Negative for hearing loss.   Gastrointestinal:  Negative for nausea.  Neurological:  Positive for dizziness. Negative for headaches.  All other systems reviewed and are negative.   Physical Exam Updated Vital Signs BP (!) 144/102   Pulse 73   Temp 98.4 F (36.9 C) (Oral)   Resp 19   Ht 6\' 1"  (1.854 m)   Wt 132.9 kg   SpO2 98%   BMI 38.66 kg/m  Physical Exam Vitals and nursing note reviewed.  Constitutional:       Appearance: He is well-developed.  HENT:     Head: Normocephalic.     Nose: Nose normal.     Mouth/Throat:     Mouth: Mucous membranes are moist.  Eyes:     Extraocular Movements: Extraocular movements intact.     Pupils: Pupils are equal, round, and reactive to light.  Cardiovascular:     Rate and Rhythm: Normal rate and regular rhythm.  Pulmonary:     Effort: Pulmonary effort is normal.  Abdominal:     General: There is no distension.  Musculoskeletal:        General: Normal range of motion.     Cervical back: Normal range of motion.  Skin:    General: Skin is warm.  Neurological:     General: No focal deficit present.     Mental Status: He is alert and oriented to person, place, and time.  Psychiatric:        Mood and Affect: Mood normal.    ED Results / Procedures / Treatments   Labs (all labs ordered are listed, but only abnormal results are displayed) Labs Reviewed  BASIC METABOLIC PANEL WITH GFR - Abnormal; Notable for the following components:      Result Value   Glucose, Bld 100 (*)    Calcium 8.8 (*)    All other components within normal limits  CBC  URINALYSIS, ROUTINE W REFLEX MICROSCOPIC  CBG MONITORING, ED  TROPONIN I (HIGH SENSITIVITY)    EKG None  Radiology No results found.  Procedures Procedures    Medications Ordered in ED Medications - No data to display  ED Course/ Medical Decision Making/ A&P                                 Medical Decision Making Patient complains of elevated blood pressure today.  Patient reports he feels dizzy.  Patient reports he is concerned that he could be having a heart attack.  Patient reports he has a history of anxiety.  Patient is hoping that this is an anxiety attack.  Amount and/or Complexity of Data Reviewed Independent Historian: spouse    Details: Patient is here with his husband who is supportive External Data Reviewed: notes.    Details: Primary care notes reviewed Labs: ordered.  Decision-making details documented in ED Course.    Details: Labs ordered reviewed and interpreted.  Glucose is 100 BUN and creatinine are normal. Radiology: ordered. ECG/medicine tests: ordered and independent interpretation performed. Decision-making details documented in ED Course.    Details: EKG  ordered reviewed and interpreted            Final Clinical Impression(s) / ED Diagnoses Final diagnoses:  Dizziness    Rx / DC Orders ED  Discharge Orders     None      Pt's care turned over to [facility] and troponin is pending   Osie Cheeks 05/30/23 2212    Lonell Grandchild, MD 05/31/23 1339

## 2023-05-30 NOTE — ED Triage Notes (Signed)
 Pt arrived reporting dizziness since this morning while working. Reports history of anxiety. Reports history of HTN and complaint with med. BP has been high today. Intermittent chest pain. No other concerns reported.

## 2023-05-31 NOTE — Discharge Instructions (Signed)
 Labs and head CT today were reassuring. Follow-up with your primary care doctor. Return here for new concerns.

## 2023-05-31 NOTE — ED Provider Notes (Signed)
  CT head reassuring.  Trop negative.  Patient appears stable for discharge.  Will take his BP meds once returning home.  Encouraged to follow-up with PCP.  Return here for new concerns.   Garlon Hatchet, PA-C 05/31/23 Phoebe Perch, April, MD 05/31/23 [phone]

## 2023-06-25 ENCOUNTER — Ambulatory Visit (INDEPENDENT_AMBULATORY_CARE_PROVIDER_SITE_OTHER): Admitting: Family Medicine

## 2023-06-25 DIAGNOSIS — Z91199 Patient's noncompliance with other medical treatment and regimen due to unspecified reason: Secondary | ICD-10-CM

## 2023-07-04 NOTE — Progress Notes (Signed)
No show for this appt

## 2023-07-09 ENCOUNTER — Ambulatory Visit (INDEPENDENT_AMBULATORY_CARE_PROVIDER_SITE_OTHER): Admitting: Family Medicine

## 2023-08-05 ENCOUNTER — Ambulatory Visit

## 2023-08-05 ENCOUNTER — Ambulatory Visit
Admission: EM | Admit: 2023-08-05 | Discharge: 2023-08-05 | Disposition: A | Discharge status: Home / Self Care | Attending: Internal Medicine | Admitting: Internal Medicine

## 2023-08-05 ENCOUNTER — Other Ambulatory Visit: Payer: Self-pay

## 2023-08-05 ENCOUNTER — Encounter: Payer: Self-pay | Admitting: Emergency Medicine

## 2023-08-05 ENCOUNTER — Telehealth: Payer: Self-pay

## 2023-08-05 DIAGNOSIS — J029 Acute pharyngitis, unspecified: Secondary | ICD-10-CM | POA: Diagnosis not present

## 2023-08-05 DIAGNOSIS — J22 Unspecified acute lower respiratory infection: Secondary | ICD-10-CM | POA: Diagnosis not present

## 2023-08-05 DIAGNOSIS — R051 Acute cough: Secondary | ICD-10-CM

## 2023-08-05 LAB — POC SARS CORONAVIRUS 2 AG -  ED: SARS Coronavirus 2 Ag: NEGATIVE

## 2023-08-05 MED ORDER — BENZONATATE 100 MG PO CAPS: 100.0000 mg | ORAL_CAPSULE | Freq: Three times a day (TID) | ORAL | 0 refills | Status: DC

## 2023-08-05 MED ORDER — PROMETHAZINE-DM 6.25-15 MG/5ML PO SYRP: 5.0000 mL | ORAL_SOLUTION | Freq: Three times a day (TID) | ORAL | 0 refills | Status: DC | PRN

## 2023-08-05 MED ORDER — AZITHROMYCIN 250 MG PO TABS: ORAL_TABLET | ORAL | 0 refills | Status: DC

## 2023-08-05 NOTE — ED Provider Notes (Signed)
 [facility]    CSN: [id number] Arrival date & time: 08/05/23  1038      History   Chief Complaint Chief Complaint  Patient presents with   Sore Throat   Cough    HPI Brendan Neal is a 58 y.o. male.   58 year old male who presents urgent care with complaints of productive severe cough, sore throat, congestion, chills and upper abdominal pain.  His symptoms started about 4 to 5 days ago.  He reports that he did take his father to a doctor's appointment and that there was a lady they are coughing significantly and not washing her hands.  He was concerned that he may have contracted COVID.  He did a home COVID test but this was negative.  He has not been running fever.  He is eating and drinking normally.  He denies nausea, vomiting, chest pain, diarrhea, ear pain, shortness of breath.  He has been using Mucinex  DM without much relief.  He has also tried honey and gargling salt water.  He has developed pain in the upper abdomen when he coughs for muscular strain.   Sore Throat Associated symptoms include abdominal pain (From coughing). Pertinent negatives include no chest pain and no shortness of breath.  Cough Associated symptoms: chills and sore throat   Associated symptoms: no chest pain, no ear pain, no fever, no rash and no shortness of breath     Past Medical History:  Diagnosis Date   Acute left lumbar radiculopathy 01/30/2019   Cervico-occipital neuralgia of left side 07/08/2015   Chest discomfort 03/01/2018   DVT (deep venous thrombosis) (HCC)    Environmental allergies 02/18/2019   Facial flushing 03/01/2018   GAD (generalized anxiety disorder) 02/17/2015   Genital warts    GERD (gastroesophageal reflux disease)    HTN (hypertension) 09/11/2014   Hypertension    Lumbar degenerative disc disease 01/30/2019   Nasal septal deviation 10/06/2016   Obese 09/23/2015   Panic attacks 02/20/2019   Seborrheic dermatitis 07/02/2015   Sinusitis, chronic 09/11/2014    Situational anxiety 09/11/2014   Snores 09/11/2014    Patient Active Problem List   Diagnosis Date Noted   Abnormal craving 03/01/2023   Suspected sleep apnea 03/01/2023   Polycythemia 03/01/2023   Cough 01/11/2022   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   Colon cancer screening 04/03/2021   Neoplasm of uncertain behavior of skin 04/03/2021   Left ankle pain 09/22/2019   Panic attacks 02/20/2019   Environmental allergies 02/18/2019   DDD (degenerative disc disease), thoracolumbar 01/30/2019   Nasal septal deviation 10/06/2016   Class 2 obesity without serious comorbidity with body mass index (BMI) of 38.0 to 38.9 in adult 09/23/2015   GAD (generalized anxiety disorder) 02/17/2015   HTN (hypertension) 09/11/2014   Snores 09/11/2014   Situational anxiety 09/11/2014   Sinusitis, chronic 09/11/2014   GERD (gastroesophageal reflux disease) 09/11/2014    Past Surgical History:  Procedure Laterality Date   TONSILLECTOMY     WISDOM TOOTH EXTRACTION         Home Medications    Prior to Admission medications   Medication Sig Start Date End Date Taking? Authorizing Provider  aspirin EC 81 MG tablet Take 81 mg by mouth daily. Swallow whole.   Yes [provider]  azithromycin  (ZITHROMAX ) 250 MG tablet Take first 2 tablets together, then 1 every day until finished. 08/05/23  Yes Brahim Dolman A, PA-C  benzonatate  (TESSALON ) 100 MG capsule Take 1 capsule (100 mg total) by mouth  every 8 (eight) hours. 08/05/23  Yes Lokelani Lutes A, PA-C  loratadine (CLARITIN) 10 MG tablet Take 10 mg by mouth daily as needed.    Yes [provider]  metoprolol  succinate (TOPROL -XL) 50 MG 24 hr tablet TAKE 1 TABLET(50 MG) BY MOUTH DAILY WITH OR IMMEDIATELY FOLLOWING A MEAL 03/27/23  Yes Adela Holter, DO  Multiple Vitamin (MULTIVITAMIN) tablet Take 1 tablet by mouth daily.   Yes [provider]  olmesartan  (BENICAR ) 5 MG tablet TAKE 1 TABLET(5 MG) BY MOUTH DAILY 03/27/23  Yes  Adela Holter, DO  promethazine -dextromethorphan (PROMETHAZINE -DM) 6.25-15 MG/5ML syrup Take 5 mLs by mouth every 8 (eight) hours as needed for cough. 08/05/23  Yes Sebastien Jackson A, PA-C  ALPRAZolam  (XANAX ) 0.5 MG tablet Take 1 hour prior to flight.  May repeat if needing during flight. 06/27/21   Adela Holter, DO  Calcium  Carbonate Antacid (TUMS PO) Take by mouth daily as needed.     [provider]  fluticasone  (FLONASE) 50 MCG/ACT nasal spray Place 1 spray into both nostrils daily as needed for allergies or rhinitis.    [provider]  Homeopathic Products (ARNICARE) GEL  12/26/21   [provider]  hydrOXYzine  (ATARAX /VISTARIL ) 25 MG tablet TAKE 1 TABLET(25 MG) BY MOUTH EVERY 8 HOURS AS NEEDED FOR ANXIETY 02/05/20   Alexander, Natalie, DO  methocarbamol  (ROBAXIN ) 500 MG tablet Take 1 tablet (500 mg total) by mouth daily as needed for muscle spasms. 08/14/22   Leonides Ramp, FNP  rizatriptan  (MAXALT -MLT) 5 MG disintegrating tablet DISSOLVE 1 TABLET ON THE TONGUE AT ONSET OF HEADACHE. MAY REPEAT IN 2 HOURS AS NEEDED OR AS DIRECTED 11/04/21   Adela Holter, DO  Simethicone (GAS-X PO) Take by mouth as needed.    [provider]  sodium chloride (OCEAN) 0.65 % SOLN nasal spray Place 1 spray into both nostrils as needed for congestion.    [provider]  triazolam  (HALCION ) 0.25 MG tablet 1-2 tabs PO 2 hours before procedure or imaging.  Do not drive with this medication. 12/29/21   Gean Keels, MD    Family History Family History  Problem Relation Age of Onset   Alcohol abuse Brother    Depression Brother    Diabetes Maternal Aunt    High blood pressure Mother    Hypertension Mother    Hyperlipidemia Mother    Migraines Father    High blood pressure Father    Hypertension Father    Varicose Veins Father    High blood pressure Maternal Grandmother    High blood pressure Maternal Grandfather    High blood pressure Paternal  Grandmother    Diabetes Paternal Grandfather    High blood pressure Paternal Grandfather     Social History Social History   Tobacco Use   Smoking status: Never   Smokeless tobacco: Never  Vaping Use   Vaping status: Never Used  Substance Use Topics   Alcohol use: No   Drug use: No     Allergies   Singulair  [montelukast ] and Sudafed [pseudoephedrine]   Review of Systems Review of Systems  Constitutional:  Positive for chills and fatigue. Negative for fever.  HENT:  Positive for congestion, sore throat and trouble swallowing. Negative for ear pain.   Eyes:  Negative for pain and visual disturbance.  Respiratory:  Positive for cough. Negative for shortness of breath.   Cardiovascular:  Negative for chest pain and palpitations.  Gastrointestinal:  Positive for abdominal pain (From coughing). Negative for diarrhea,  nausea and vomiting.  Genitourinary:  Negative for dysuria and hematuria.  Musculoskeletal:  Negative for arthralgias and back pain.  Skin:  Negative for color change and rash.  Neurological:  Negative for seizures and syncope.  All other systems reviewed and are negative.    Physical Exam Triage Vital Signs ED Triage Vitals  Encounter Vitals Group     BP 08/05/23 1103 116/85     Girls Systolic BP Percentile --      Girls Diastolic BP Percentile --      Boys Systolic BP Percentile --      Boys Diastolic BP Percentile --      Pulse Rate 08/05/23 1103 94     Resp 08/05/23 1103 20     Temp 08/05/23 1103 99.5 F (37.5 C)     Temp Source 08/05/23 1103 Oral     SpO2 08/05/23 1103 93 %     Weight --      Height --      Head Circumference --      Peak Flow --      Pain Score 08/05/23 1059 5     Pain Loc --      Pain Education --      Exclude from Growth Chart --    No data found.  Updated Vital Signs BP 116/85 (BP Location: Left Arm)   Pulse 94   Temp 99.5 F (37.5 C) (Oral)   Resp 20   SpO2 93%   Visual Acuity Right Eye Distance:   Left Eye  Distance:   Bilateral Distance:    Right Eye Near:   Left Eye Near:    Bilateral Near:     Physical Exam Vitals and nursing note reviewed.  Constitutional:      General: He is not in acute distress.    Appearance: He is well-developed.  HENT:     Head: Normocephalic and atraumatic.     Right Ear: Tympanic membrane normal.     Left Ear: Tympanic membrane normal.     Nose: Congestion present.     Mouth/Throat:     Mouth: Mucous membranes are moist.     Pharynx: Posterior oropharyngeal erythema present. No pharyngeal swelling or oropharyngeal exudate.   Eyes:     Conjunctiva/sclera: Conjunctivae normal.    Cardiovascular:     Rate and Rhythm: Normal rate and regular rhythm.     Heart sounds: No murmur heard. Pulmonary:     Effort: Pulmonary effort is normal. No respiratory distress.     Breath sounds: Examination of the right-upper field reveals rhonchi. Examination of the left-upper field reveals rhonchi. Rhonchi present. No decreased breath sounds or wheezing.     Comments: Coarse breath sounds bilateral upper lobes with the left being worse Abdominal:     Palpations: Abdomen is soft.     Tenderness: There is no abdominal tenderness.   Musculoskeletal:        General: No swelling.     Cervical back: Neck supple.   Skin:    General: Skin is warm and dry.     Capillary Refill: Capillary refill takes less than 2 seconds.   Neurological:     Mental Status: He is alert.   Psychiatric:        Mood and Affect: Mood normal.      UC Treatments / Results  Labs (all labs ordered are listed, but only abnormal results are displayed) Labs Reviewed  POC SARS CORONAVIRUS 2 AG -  ED  EKG   Radiology DG Chest 2 View Result Date: 08/05/2023 EXAM: 2 VIEW(S) XRAY OF THE CHEST 08/05/2023 11:38:00 AM COMPARISON: 2-view chest x-ray 11/16/2021 CLINICAL HISTORY: cough. complaints of cough-productive-clear/Thurlow Gallaga, sore throat since 4 days ago. FINDINGS: LUNGS AND PLEURA: No  focal pulmonary opacity. No pulmonary edema. No pleural effusion. No pneumothorax. HEART AND MEDIASTINUM: The heart size is exaggerated by low lung volumes. BONES AND SOFT TISSUES: No acute osseous abnormality. IMPRESSION: 1. No acute process. Electronically signed by: Audree Leas MD 08/05/2023 11:54 AM EDT RP Workstation: [id number]    Procedures Procedures (including critical care time)  Medications Ordered in UC Medications - No data to display  Initial Impression / Assessment and Plan / UC Course  I have reviewed the triage vital signs and the nursing notes.  Pertinent labs & imaging results that were available during my care of the patient were reviewed by me and considered in my medical decision making (see chart for details).     Acute cough - Plan: DG Chest 2 View, DG Chest 2 View  Lower respiratory infection   COVID testing done today was negative.  Chest x-ray done today.  Final evaluation by the radiologist is still pending but on brief evaluation there does appear to be an area of concern that may be consistent with a mild pneumonia.  After the radiologist has reviewed the x-ray, if there is any change in treatment we will contact you.  If there is no change in treatment, your results will be available on MyChart.  We will treat with the following: Azithromycin  250mg  Take 2 tablets today and the 1 tablet daily for 4 more days. Promethazine  DM 5 mL every 8 hours as needed for cough.  Use caution as this medication can cause drowsiness. Benzonatate  (tessalon ) 100 mg every 8 hours as needed for cough.  Rest and stay hydrated.   Return to urgent care or PCP if symptoms worsen or fail to resolve.    Final Clinical Impressions(s) / UC Diagnoses   Final diagnoses:  Acute cough  Lower respiratory infection     Discharge Instructions      COVID testing done today was negative.  Chest x-ray done today.  Final evaluation by the radiologist is still pending but on brief  evaluation there does appear to be an area of concern that may be consistent with a mild pneumonia.  After the radiologist has reviewed the x-ray, if there is any change in treatment we will contact you.  If there is no change in treatment, your results will be available on MyChart.  We will treat with the following: Azithromycin  250mg  Take 2 tablets today and the 1 tablet daily for 4 more days. Promethazine  DM 5 mL every 8 hours as needed for cough.  Use caution as this medication can cause drowsiness. Benzonatate  (tessalon ) 100 mg every 8 hours as needed for cough.  Rest and stay hydrated.   Return to urgent care or PCP if symptoms worsen or fail to resolve.       ED Prescriptions     Medication Sig Dispense Auth. Provider   azithromycin  (ZITHROMAX ) 250 MG tablet Take first 2 tablets together, then 1 every day until finished. 6 tablet Nidal Rivet A, PA-C   promethazine -dextromethorphan (PROMETHAZINE -DM) 6.25-15 MG/5ML syrup Take 5 mLs by mouth every 8 (eight) hours as needed for cough. 180 mL Niza Soderholm A, PA-C   benzonatate  (TESSALON ) 100 MG capsule Take 1 capsule (100 mg total) by mouth every 8 (eight)  hours. 21 capsule Kreg Pesa, [place]      PDMP not reviewed this encounter.   Kreg Pesa, [place] 08/05/23 1220

## 2023-08-05 NOTE — Discharge Instructions (Addendum)
 COVID testing done today was negative.  Chest x-ray done today.  Final evaluation by the radiologist is still pending but on brief evaluation there does appear to be an area of concern that may be consistent with a mild pneumonia.  After the radiologist has reviewed the x-ray, if there is any change in treatment we will contact you.  If there is no change in treatment, your results will be available on MyChart.  We will treat with the following: Azithromycin  250mg  Take 2 tablets today and the 1 tablet daily for 4 more days. Promethazine  DM 5 mL every 8 hours as needed for cough.  Use caution as this medication can cause drowsiness. Benzonatate  (tessalon ) 100 mg every 8 hours as needed for cough.  Rest and stay hydrated.   Return to urgent care or PCP if symptoms worsen or fail to resolve.

## 2023-08-05 NOTE — ED Triage Notes (Addendum)
 Patient presents to Urgent Care with complaints of cough-productive-clear/white, sore throat since 4 days ago. Patient reports taking Mucinex  DM with minimum relief, gargled with salt water and lime /honey juice and Claritin. Having pain with coughing. Did home covid test was negative but was expired. May have been exposed on Tuesday when taking his father to an doctor appointment. Does have some chills. Denies any fever.

## 2023-08-05 NOTE — Telephone Encounter (Signed)
 Pt called requesting med change to another pharm. Meds sent per request

## 2023-08-13 ENCOUNTER — Encounter (INDEPENDENT_AMBULATORY_CARE_PROVIDER_SITE_OTHER): Payer: Self-pay

## 2023-08-15 ENCOUNTER — Ambulatory Visit (INDEPENDENT_AMBULATORY_CARE_PROVIDER_SITE_OTHER): Admitting: Family Medicine

## 2023-08-15 ENCOUNTER — Encounter (INDEPENDENT_AMBULATORY_CARE_PROVIDER_SITE_OTHER): Payer: Self-pay | Admitting: Family Medicine

## 2023-08-15 VITALS — BP 135/87 | HR 77 | Temp 98.1°F | Ht 72.0 in | Wt 293.0 lb

## 2023-08-15 DIAGNOSIS — I10 Essential (primary) hypertension: Secondary | ICD-10-CM

## 2023-08-15 DIAGNOSIS — Z1331 Encounter for screening for depression: Secondary | ICD-10-CM

## 2023-08-15 DIAGNOSIS — R0602 Shortness of breath: Secondary | ICD-10-CM | POA: Insufficient documentation

## 2023-08-15 DIAGNOSIS — D751 Secondary polycythemia: Secondary | ICD-10-CM

## 2023-08-15 DIAGNOSIS — R739 Hyperglycemia, unspecified: Secondary | ICD-10-CM | POA: Diagnosis not present

## 2023-08-15 DIAGNOSIS — E669 Obesity, unspecified: Secondary | ICD-10-CM

## 2023-08-15 DIAGNOSIS — Z6839 Body mass index (BMI) 39.0-39.9, adult: Secondary | ICD-10-CM

## 2023-08-15 DIAGNOSIS — R0609 Other forms of dyspnea: Secondary | ICD-10-CM | POA: Diagnosis not present

## 2023-08-15 DIAGNOSIS — R5383 Other fatigue: Secondary | ICD-10-CM

## 2023-08-15 NOTE — Progress Notes (Signed)
 Office: [phone]  /  Fax: [phone]  WEIGHT SUMMARY AND BIOMETRICS  Anthropometric Measurements Height: 6' (1.829 m) Weight: 293 lb (132.9 kg) BMI (Calculated): 39.73 Weight at Last Visit: N/A Weight Lost Since Last Visit: N/A Weight Gained Since Last Visit: N/A Starting Weight: 293 lb Peak Weight: 293 lb Waist Measurement : 55 inches   Body Composition  Body Fat %: 38.9 % Fat Mass (lbs): 114.2 lbs Muscle Mass (lbs): 170.8 lbs Total Body Water (lbs): 137 lbs Visceral Fat Rating : 24   Other Clinical Data RMR: 1757 Fasting: Yes Labs: Yes Today's Visit #: 1 Starting Date: 08/15/23 Comments: First Visit    Chief Complaint: OBESITY    History of Present Illness Brendan Neal is a 58 year old male who presents for evaluation and management of obesity.  He has experienced significant weight gain since starting a sedentary job after college. Previously, he was a high school and college athlete and did not struggle with weight during those times. He believes his current lifestyle and multiple stressors, including a history of anxiety, contribute to his weight gain.  He has worsening fatigue, which he associates with his weight gain, impacting his daily activities.  He experiences dyspnea on exertion, which has worsened with weight gain. This shortness of breath is a new development that he did not experience during his athletic years.  He has a history of hypertension and hyperglycemia, with multiple glucose levels recorded above 100. There has been no thorough workup for his hyperglycemia yet.  He has a history of polycythemia, which has shown improvement as of his last lab results from last month.  Patient has filled out his new patient paperwork regarding his obesity and obesity-related comorbidities. Pertinent positives are as follows: He has not had weight loss surgery and is not currently interested in having weight loss surgery. 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 He is married to his husband and feels that his family is supportive of his weight loss efforts. Currently, he engages in minimal exercise, possibly walking once a week.  Weight history: His weight today was 293 pounds. His highest weight was 300 pounds. His goal weight is to reach 200 pounds within a year. He started gaining excessive weight once he began working in a desk job. Identified reasons for his weight gain include anxiety and lack of exercise. He has not participated in any formal weight loss program in the past.  Nutritional evaluation: He eats outside the home 2 to 3 times a day, mostly opting for grab-and-go meals. His husband does the grocery shopping and much of the cooking. He reports cravings for salty and greasy foods, especially in the morning. He dislikes fried fish, stewed tomatoes, salmon patties, and very spicy foods. Overall, he does not consider himself a picky eater. He often snacks between meals but not necessarily at night. He tends to snack on items like peanuts, peanut butter, crackers, chips, and other salty foods. He does not wake up in the middle of the night feeling hungry and is not trying to follow a vegetarian or vegan lifestyle. Factors that make healthy eating difficult include a non-functional kitchen.  He notes some emotional eating behaviors, particularly when he snacks in response to stress. He snacks when he feels sad, eats as a reward, eats when bored, and eats to comfort himself. He reports feeling guilt after overeating or making poor food choices and states that this has been a  lifelong habit. He did not feel judged about his weight or eating habits growing up; he was slim and consistently exercised when younger. He now feels somewhat out of control with his eating. He acknowledges consuming larger portions than the average person 4 to 5 times per week. Although he has not been diagnosed with an eating disorder by a  physician, he perceives his cravings for salty and greasy foods as excessive and abnormal.  Testing: His modified PQ-9 mood and food score was significantly elevated at 23. His Epworth sleepiness score was within normal limits at 6. His calculated basal metabolic rate is 7495, while his resting energy expenditure was lower than expected at 1757.      PHYSICAL EXAM:  Blood pressure 135/87, pulse 77, temperature 98.1 F (36.7 C), height 6' (1.829 m), weight 293 lb (132.9 kg), SpO2 95%. Body mass index is 39.74 kg/m.  DIAGNOSTIC DATA REVIEWED:  BMET    Component Value Date/Time   NA 136 05/30/2023 1617   K 3.9 05/30/2023 1617   CL 103 05/30/2023 1617   CO2 25 05/30/2023 1617   GLUCOSE 100 (H) 05/30/2023 1617   BUN 17 05/30/2023 1617   CREATININE 1.01 05/30/2023 1617   CREATININE 1.21 04/02/2018 0720   CALCIUM  8.8 (L) 05/30/2023 1617   GFRNONAA >60 05/30/2023 1617   GFRNONAA 69 04/02/2018 0720   GFRAA >60 10/03/2019 0551   GFRAA 80 04/02/2018 0720   No results found for: HGBA1C No results found for: INSULIN Lab Results  Component Value Date   TSH 3.19 04/02/2018   CBC    Component Value Date/Time   WBC 7.1 05/30/2023 1617   RBC 5.26 05/30/2023 1617   HGB 16.0 05/30/2023 1617   HCT 49.4 05/30/2023 1617   PLT 216 05/30/2023 1617   MCV 93.9 05/30/2023 1617   MCH 30.4 05/30/2023 1617   MCHC 32.4 05/30/2023 1617   RDW 12.6 05/30/2023 1617   Iron Studies No results found for: IRON, TIBC, FERRITIN, IRONPCTSAT Lipid Panel     Component Value Date/Time   CHOL 175 04/02/2018 0720   TRIG 109 04/02/2018 0720   HDL 56 04/02/2018 0720   CHOLHDL 3.1 04/02/2018 0720   LDLCALC 98 04/02/2018 0720   LDLDIRECT 98 09/11/2014 0943   Hepatic Function Panel     Component Value Date/Time   PROT 6.7 10/03/2019 0551   ALBUMIN 3.8 10/03/2019 0551   AST 20 10/03/2019 0551   ALT 23 10/03/2019 0551   ALKPHOS 73 10/03/2019 0551   BILITOT 0.8 10/03/2019 0551       Component Value Date/Time   TSH 3.19 04/02/2018 0720   Nutritional No results found for: VD25OH   Assessment and Plan Assessment & Plan Obesity Obesity contributes to fatigue, dyspnea on exertion, and hypertension, associated with a sedentary lifestyle and stress-related eating. The goal is to improve overall health and reduce cardiovascular risk. - Implement diet and exercise plan to promote weight loss. - Follow category three eating plan with flexible timing. - Encourage involvement of his husband in meal planning and preparation. - Advise reducing eating out to aid weight loss and reduce stressors. - Follow up in two weeks to review test results and assess adherence to the eating plan.  Hypertension Hypertension may be exacerbated by obesity. Managing obesity is expected to positively impact blood pressure control and decrease cardiovascular risk. - Check labs to evaluate for non-weight related causes of hypertension.  Implement diet and exercise plan to promote weight loss.  Fatigue Fatigue  is likely multifactorial, related to obesity, sedentary lifestyle, and possibly sleep apnea. Addressing obesity is expected to improve fatigue. - Implement diet and exercise plan to address obesity and improve fatigue. - Check labs to evaluate for other non-weight related causes of fatigue.  Dyspnea on exertion Dyspnea on exertion is likely related to obesity and decreased physical fitness. Improving weight and fitness levels is expected to alleviate symptoms. - Implement diet and exercise plan to address obesity and improve dyspnea. - Check labs  and IC to evaluate for other non-weight related causes of dyspnea.  Hyperglycemia Multiple glucose levels above 100 indicate hyperglycemia, which has not been thoroughly evaluated. Weight loss and lifestyle modifications are expected to improve glucose levels. - Check labs to evaluate hyperglycemia and assess for diabetes.  Implement diet and  exercise plan to promote weight loss.  Polycythemia Polycythemia with elevated hemoglobin levels may be related to obstructive sleep apnea. Recent labs show improvement. -Check labs again to reassess, may need referral for sleep consultation  General Health Maintenance Focus is on improving overall health through lifestyle modifications to reduce cardiovascular risk. - Implement lifestyle modifications including diet and exercise to improve overall health.     I have personally spent 52 minutes total time today in preparation, patient care, and documentation for this visit, including the following: review of clinical lab tests; review of medical history, review of obesity pathophysiology, review of treatment options and nutritional counseling   He was informed of the importance of frequent follow up visits to maximize his success with intensive lifestyle modifications for his multiple health conditions.    Louann Penton, MD

## 2023-08-16 ENCOUNTER — Encounter (INDEPENDENT_AMBULATORY_CARE_PROVIDER_SITE_OTHER): Payer: Self-pay | Admitting: Family Medicine

## 2023-08-29 ENCOUNTER — Ambulatory Visit (INDEPENDENT_AMBULATORY_CARE_PROVIDER_SITE_OTHER): Admitting: Family Medicine

## 2023-09-01 ENCOUNTER — Ambulatory Visit
Admission: EM | Admit: 2023-09-01 | Discharge: 2023-09-01 | Disposition: A | Discharge status: Home / Self Care | Attending: Family Medicine | Admitting: Family Medicine

## 2023-09-01 ENCOUNTER — Other Ambulatory Visit: Payer: Self-pay

## 2023-09-01 DIAGNOSIS — I10 Essential (primary) hypertension: Secondary | ICD-10-CM | POA: Diagnosis not present

## 2023-09-01 NOTE — Discharge Instructions (Addendum)
 Advised patient to take blood pressure in the morning prior to eating breakfast and to log measurements for the next 10 days so that PCP can better understand daily blood pressure trends.  Advised if visual acuity changes or worsen and/or unresolved please follow-up with ophthalmology/optometry or PCP for further evaluation.  Encouraged to increase daily water intake to 64 ounces per day 7 days/week.

## 2023-09-01 NOTE — ED Triage Notes (Signed)
 Pt c/o high bp reading this morning. Reading of 179/116 this am. Normal reading yesterday. Hx of anxiety. Took a hydroxyzine  last night which did not help bp reading. Also states he's having floaters in LT eye.

## 2023-09-01 NOTE — ED Provider Notes (Signed)
 [name]    CSN: [id number] Arrival date & time: 09/01/23  0808      History   Chief Complaint Chief Complaint  Patient presents with   Hypertension    HPI Brendan Neal is a 58 y.o. male.   HPI pleasant 58 year old male presents with concerns of elevated blood pressure.  PMH significant for hypertension is currently on olmesartan  and Toprol -XL.  Patient is concerned as systolic blood pressure has reached mid to high 170s over the past 2 days.  Patient is accompanied by his partner.  PMH significant for DVT, back pain, and severe/morbid obesity.  Past Medical History:  Diagnosis Date   Acute left lumbar radiculopathy 01/30/2019   ADHD    Back pain    Back pain    Cervico-occipital neuralgia of left side 07/08/2015   Chest discomfort 03/01/2018   Constipation    Constipation    Depression    DVT (deep venous thrombosis) (HCC)    Environmental allergies 02/18/2019   Facial flushing 03/01/2018   GAD (generalized anxiety disorder) 02/17/2015   Genital warts    GERD (gastroesophageal reflux disease)    HTN (hypertension) 09/11/2014   Hypertension    Joint pain    Joint pain    Lumbar degenerative disc disease 01/30/2019   Nasal septal deviation 10/06/2016   Obese 09/23/2015   Panic attacks 02/20/2019   Seborrheic dermatitis 07/02/2015   Sinusitis, chronic 09/11/2014   Situational anxiety 09/11/2014   Sleep apnea    Snores 09/11/2014   Swelling of lower extremity     Patient Active Problem List   Diagnosis Date Noted   BMI 39.0-39.9,adult 08/15/2023   Obesity, Beginning BMI 39.8 08/15/2023   Other fatigue 08/15/2023   SOBOE (shortness of breath on exertion) 08/15/2023   Hyperglycemia 08/15/2023   Abnormal craving 03/01/2023   Suspected sleep apnea 03/01/2023   Polycythemia 03/01/2023   Cough 01/11/2022   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   Depression screening 04/03/2021   Neoplasm of uncertain behavior of skin 04/03/2021   Left ankle pain  09/22/2019   Panic attacks 02/20/2019   Environmental allergies 02/18/2019   DDD (degenerative disc disease), thoracolumbar 01/30/2019   Nasal septal deviation 10/06/2016   Class 2 obesity without serious comorbidity with body mass index (BMI) of 38.0 to 38.9 in adult 09/23/2015   GAD (generalized anxiety disorder) 02/17/2015   HTN (hypertension) 09/11/2014   Snores 09/11/2014   Situational anxiety 09/11/2014   Sinusitis, chronic 09/11/2014   GERD (gastroesophageal reflux disease) 09/11/2014    Past Surgical History:  Procedure Laterality Date   TONSILLECTOMY     WISDOM TOOTH EXTRACTION         Home Medications    Prior to Admission medications   Medication Sig Start Date End Date Taking? Authorizing Provider  Calcium  Carbonate Antacid (TUMS PO) Take by mouth daily as needed.     [provider]  Homeopathic Products ([facility]) GEL  12/26/21   [provider]  hydrOXYzine  (ATARAX /VISTARIL ) 25 MG tablet TAKE 1 TABLET(25 MG) BY MOUTH EVERY 8 HOURS AS NEEDED FOR ANXIETY 02/05/20   Alexander, Natalie, DO  loratadine (CLARITIN) 10 MG tablet Take 10 mg by mouth daily as needed.     [provider]  metoprolol  succinate (TOPROL -XL) 50 MG 24 hr tablet TAKE 1 TABLET(50 MG) BY MOUTH DAILY WITH OR IMMEDIATELY FOLLOWING A MEAL 03/27/23   Alvia Bring, DO  Multiple Vitamin (MULTIVITAMIN) tablet Take 1 tablet by mouth daily.    [provider]  olmesartan  (BENICAR ) 5 MG tablet TAKE 1 TABLET(5 MG) BY MOUTH DAILY 03/27/23   Alvia Bring, DO  rizatriptan  (MAXALT -MLT) 5 MG disintegrating tablet DISSOLVE 1 TABLET ON THE TONGUE AT ONSET OF HEADACHE. MAY REPEAT IN 2 HOURS AS NEEDED OR AS DIRECTED 11/04/21   Alvia Bring, DO  SALINE NASAL SPRAY NA Place into the nose.    [provider]  Simethicone (GAS-X PO) Take by mouth as needed.    [provider]  sodium chloride (OCEAN) 0.65 % SOLN nasal spray Place 1 spray into both nostrils as needed for  congestion.    [provider]    Family History Family History  Problem Relation Age of Onset   Obesity Mother    Depression Mother    Anxiety disorder Mother    Heart disease Mother    High blood pressure Mother    Hypertension Mother    Hyperlipidemia Mother    Eating disorder Mother    Obesity Father    Anxiety disorder Father    Depression Father    Cancer Father    Heart disease Father    Migraines Father    High blood pressure Father    Hypertension Father    Varicose Veins Father    Sleep apnea Father    Alcohol abuse Brother    Depression Brother    Diabetes Maternal Aunt    High blood pressure Maternal Grandmother    High blood pressure Maternal Grandfather    High blood pressure Paternal Grandmother    Diabetes Paternal Grandfather    High blood pressure Paternal Grandfather     Social History Social History   Tobacco Use   Smoking status: Never   Smokeless tobacco: Never  Vaping Use   Vaping status: Never Used  Substance Use Topics   Alcohol use: No   Drug use: No     Allergies   Singulair  [montelukast ] and Sudafed [pseudoephedrine]   Review of Systems Review of Systems  Cardiovascular:        Concerns with elevated blood pressure  All other systems reviewed and are negative.    Physical Exam Triage Vital Signs ED Triage Vitals  Encounter Vitals Group     BP 09/01/23 0833 (!) 137/95     Girls Systolic BP Percentile --      Girls Diastolic BP Percentile --      Boys Systolic BP Percentile --      Boys Diastolic BP Percentile --      Pulse Rate 09/01/23 0833 87     Resp 09/01/23 0833 18     Temp 09/01/23 0833 98.6 F (37 C)     Temp Source 09/01/23 0833 Oral     SpO2 09/01/23 0833 95 %     Weight --      Height --      Head Circumference --      Peak Flow --      Pain Score 09/01/23 0828 1     Pain Loc --      Pain Education --      Exclude from Growth Chart --    Orthostatic VS for the past 24 hrs:  BP- Lying  Pulse- Lying BP- Sitting Pulse- Sitting BP- Standing at 0 minutes Pulse- Standing at 0 minutes  09/01/23 0920 (!) 137/93 64 (!) 143/101 75 (!) 131/92 81    Updated Vital Signs BP (!) 142/105 (BP Location: Left Arm)   Pulse 87  Temp 98.6 F (37 C) (Oral)   Resp 18   SpO2 95%    Physical Exam Vitals and nursing note reviewed.  Constitutional:      Appearance: Normal appearance. He is obese.  HENT:     Head: Normocephalic and atraumatic.     Mouth/Throat:     Mouth: Mucous membranes are moist.     Pharynx: Oropharynx is clear.  Eyes:     Extraocular Movements: Extraocular movements intact.     Conjunctiva/sclera: Conjunctivae normal.     Pupils: Pupils are equal, round, and reactive to light.  Cardiovascular:     Rate and Rhythm: Normal rate and regular rhythm.     Pulses: Normal pulses.     Heart sounds: Normal heart sounds. No murmur heard.    No friction rub. No gallop.  Pulmonary:     Effort: Pulmonary effort is normal.     Breath sounds: Normal breath sounds. No wheezing, rhonchi or rales.  Musculoskeletal:        General: Normal range of motion.  Skin:    General: Skin is warm and dry.  Neurological:     General: No focal deficit present.     Mental Status: He is alert and oriented to person, place, and time. Mental status is at baseline.  Psychiatric:        Mood and Affect: Mood normal.        Behavior: Behavior normal.        Thought Content: Thought content normal.      UC Treatments / Results  Labs (all labs ordered are listed, but only abnormal results are displayed) Labs Reviewed - No data to display  EKG   Radiology No results found.  Procedures Procedures (including critical care time)  Medications Ordered in UC Medications - No data to display  Initial Impression / Assessment and Plan / UC Course  I have reviewed the triage vital signs and the nursing notes.  Pertinent labs & imaging results that were available during my care of the  patient were reviewed by me and considered in my medical decision making (see chart for details).     MDM: 1.  Essential hypertension-my manual during exam was 142/105.  Patient reports concern with elevated diastolic blood pressure in the 110s and 120s for the past 2 days.  Orthostatic blood pressures reveal above. Advised patient to take blood pressure in the morning prior to eating breakfast and to log measurements for the next 10 days so that PCP can better understand daily blood pressure trends.  Advised if visual acuity changes or worsen and/or unresolved please follow-up with ophthalmology/optometry or PCP for further evaluation.  Encouraged to increase daily water intake to 64 ounces per day 7 days/week.  Patient discharged home, hemodynamically stable. Final Clinical Impressions(s) / UC Diagnoses   Final diagnoses:  Essential hypertension     Discharge Instructions      Advised patient to take blood pressure in the morning prior to eating breakfast and to log measurements for the next 10 days so that PCP can better understand daily blood pressure trends.  Advised if visual acuity changes or worsen and/or unresolved please follow-up with ophthalmology/optometry or PCP for further evaluation.  Encouraged to increase daily water intake to 64 ounces per day 7 days/week.     ED Prescriptions   None    PDMP not reviewed this encounter.   Teddy Sharper, FNP 09/01/23 [phone]

## 2023-09-03 ENCOUNTER — Ambulatory Visit: Admitting: Physician Assistant

## 2023-09-03 ENCOUNTER — Encounter: Payer: Self-pay | Admitting: Physician Assistant

## 2023-09-03 VITALS — BP 138/88 | HR 80 | Ht 72.0 in | Wt 290.0 lb

## 2023-09-03 DIAGNOSIS — I10 Essential (primary) hypertension: Secondary | ICD-10-CM

## 2023-09-03 DIAGNOSIS — H43392 Other vitreous opacities, left eye: Secondary | ICD-10-CM | POA: Insufficient documentation

## 2023-09-03 DIAGNOSIS — H539 Unspecified visual disturbance: Secondary | ICD-10-CM | POA: Diagnosis not present

## 2023-09-03 DIAGNOSIS — R030 Elevated blood-pressure reading, without diagnosis of hypertension: Secondary | ICD-10-CM | POA: Insufficient documentation

## 2023-09-03 NOTE — Patient Instructions (Addendum)
 Keep BP medication the same for now and keep recording at home with new meter.if continue to get readings over 150/90 at home then let us  know and we will adjust your BP medication.  Follow up with Dr. Alvia in 1-2 weeks.   Will get carotid u/s ordered for risk assessment.  Encouraged to get labs ordered by provider  Referral made to [facility] retinal specialist  Eye Floaters Eye floaters are spots in your vision caused by shadows from specks of material that float inside your eye. Floaters may be more obvious when you look up at the sky or at a bright, blank background. Floaters do not usually cause vision problems, but it is still important to get an eye exam to make sure that they are not a sign of a more serious condition. What are the causes? In most cases, this condition is caused by age-related changes in the eye. As you age, the jelly-like fluid (vitreous) inside the eyeball shrinks and can become stringy. The stringy strands of vitreous cast shadows on the back of the eye (retina). These shadows show up as floaters in your vision. Other possible causes of eye floaters include: A torn retina. Bleeding inside the eye. Diabetes and other conditions can cause blood vessels in the retina to bleed. A blood clot in the major vein of the retina or its branches (retinal vein occlusion). Separation (detachment) of the: Vitreous. Retina. Eye inflammation (uveitis). Eye infection. What increases the risk? You are more likely to develop this condition if: You are older. The risk increases with age. You have nearsightedness. You have diabetes. You have had cataracts removed, or other eye surgery. You have or have had an injury or trauma to the eye. What are the signs or symptoms? The main symptom of this condition is the appearance of small, shadowy shapes moving across your vision. These shapes are called floaters. The shapes move as your eyes move. They often drift out of your central  vision when you keep your eyes still. These shapes may look like: Specks. Dots. Circles. Squiggly lines. Thread. Sometimes floaters appear along with flashes. Flashes usually occur at the edge of your vision. They may look like: Bursts of light. Flashing lights. Lightning streaks. What is commonly referred to as stars. In some cases, seeing flashes can be a sign of a torn or detached retina. This is a serious condition that requires emergency evaluation. How is this diagnosed? This condition may be diagnosed based on: Your symptoms and medical history. An exam by a health care provider who specializes in conditions and diseases of the eye (ophthalmologist). The exam may include: Putting eye drops in your eye to make the pupil wider (dilated). The pupil is the opening in the center of the eye. Checking the back of your eye with a microscope. This exam is the best way to determine whether your floaters are a normal part of aging or a warning sign of a more serious eye problem. Imaging tests such as an ultrasound. This may be needed if the specialist is unable to see well enough with the microscopes. How is this treated? Treatment for this condition may depend on the cause of the condition. If your floaters are the result of aging, you do not need treatment unless they start to affect your vision. Floaters often improve but may not go away completely. Most people adapt to the floaters or, over time, the floaters settle below the line of sight. If a retinal tear or detachment  is causing your floaters, you may need surgery. If you have an underlying condition that is causing floaters, such as an infection, the floaters should go away when that condition is treated. In rare cases, if the floaters are affecting your vision, surgery to remove the vitreous and replace it with a saltwater solution (vitrectomy) may be considered. Follow these instructions at home: Take over-the-counter and  prescription medicines only as told by your health care provider. Do not drive if you have trouble seeing. Ask your health care provider for guidance about when it is safe for you to drive. Keep all follow-up visits. This is important. Contact a health care provider if: You cannot see well because of your floaters. You develop any new symptoms. Get help right away if: You develop a sudden increase in the number of floaters you see. You develop flashes along with floaters. It looks as if a curtain is blocking part of your vision. Your vision suddenly changes, or you lose your vision completely. These symptoms may be an emergency. Get help right away. Call 911. Do not wait to see if the symptoms will go away. Do not drive yourself to the hospital. Summary Eye floaters are spots in your vision caused by specks of material that float around inside your eye. In most cases, eye floaters are caused by age-related changes in the eye. Most people do not need treatment for eye floaters unless there is another condition causing the floaters, such as a retinal tear or detachment or an infection. If you develop a sudden increase in the number of floaters, see flashes, or notice a curtain blocking part of your vision, you should see an eye care provider right away. This information is not intended to replace advice given to you by your health care provider. Make sure you discuss any questions you have with your health care provider. Document Revised: 08/25/2020 Document Reviewed: 08/25/2020 Elsevier Patient Education  2024 [employer].

## 2023-09-03 NOTE — Progress Notes (Signed)
 Acute Office Visit  Subjective:     Patient ID: Brendan Neal, male    DOB: 03-18-1965, 58 y.o.   MRN: [id number]  Chief Complaint  Patient presents with   Eye Problem    Floaters in left eye    HPI Patient is in today for left eye floaters that worsened and changed on Friday night, 3 days ago. He is accompanied by his partner.  He has also noted his BP was elevated with home cuff. He reports readings as high as 179 on top and 116 on bottom. He has had a headache but has hx of sinus headaches. He went to UC on 7/12 and BP was 137/95. He was recommended to continue to keep BP at home, increase water intake and take BP medication at the same time of day.   Pt continues to have consistent left eye floaters, elevated home BP and headaches. He is worried about retinal detachment and stroke. Denies any other vision changes, CP, SOB.   He had labs ordered in April but lab tech was not able to get his blood.   ROS See HPI.     Objective:    BP 138/88   Pulse 80   Ht 6' (1.829 m)   Wt 290 lb (131.5 kg)   SpO2 100%   BMI 39.33 kg/m  BP Readings from Last 3 Encounters:  09/03/23 138/88  09/01/23 (!) 142/105  08/15/23 135/87   Wt Readings from Last 3 Encounters:  09/03/23 290 lb (131.5 kg)  08/15/23 293 lb (132.9 kg)  05/30/23 292 lb 15.9 oz (132.9 kg)      Physical Exam Constitutional:      Appearance: Normal appearance. He is obese.  HENT:     Head: Normocephalic.  Eyes:     General:        Right eye: No discharge.        Left eye: No discharge.     Extraocular Movements: Extraocular movements intact.     Conjunctiva/sclera: Conjunctivae normal.     Pupils: Pupils are equal, round, and reactive to light.     Comments: Visualized optic disc, bilaterally.   Neck:     Vascular: No carotid bruit.  Cardiovascular:     Rate and Rhythm: Normal rate.  Pulmonary:     Effort: Pulmonary effort is normal.  Musculoskeletal:     Cervical back: Normal range of motion and neck  supple. No tenderness.  Lymphadenopathy:     Cervical: No cervical adenopathy.  Neurological:     Mental Status: He is alert and oriented to person, place, and time.  Psychiatric:        Mood and Affect: Mood normal.          Assessment & Plan:  SABRASABRAAlix was seen today for eye problem.  Diagnoses and all orders for this visit:  Vitreous floaters of left eye -     Ambulatory referral to Ophthalmology -     US  Carotid Duplex Bilateral; Future  Vision changes -     Ambulatory referral to Ophthalmology -     [place]  Carotid Duplex Bilateral; Future  Primary hypertension -     Ambulatory referral to Ophthalmology -     [place]  Carotid Duplex Bilateral; Future  Elevated blood pressure reading -     Ambulatory referral to Ophthalmology -     [place]  Carotid Duplex Bilateral; Future   Pt is getting very high home readings but our readings look good in office, concerned  that the cuff is not accurate. Pt is to get another cuff and continue to monitor BP if getting above 150/90 at home please call office back and will consider increasing olmesartan  to 10mg  daily.  Concerned with vision changes in left eye.  Referral to retinal specialist.  Printed labs to have drawn when fasting at a [facility].  Last LDL 2 years ago was 43 which is reassuring.  Will check carotid dopplers.   Return for 1-2 weeks with PCP.  Shanzay Hepworth, PA-C

## 2023-09-04 ENCOUNTER — Telehealth: Payer: Self-pay | Admitting: Physician Assistant

## 2023-09-04 NOTE — Telephone Encounter (Signed)
 Called office twice and left them a voicemail. No response yet.

## 2023-09-04 NOTE — Telephone Encounter (Signed)
 Copied from CRM [id number]. Topic: Referral - Status >> Sep 04, 2023 11:24 AM Brendan Neal wrote: Reason for CRM: Information for Referral #: [id number], patient called from [phone], states that he came in for an appt, on yesterday due to issues with his bp and vision. He stated he requested a referral in regards to some vision changes and is extremely afraid. Patient stated after expressing his concerns, Dr. Antoniette advised him she would send the referral [facility] patient stated he called and was informed that they had not yet received the referral. I gave the patient the reference number so that he can call the specialist back and have them research it there, but also he has requested a call back in regard to this matter.  Referring To Provider Information [facility] [phone] [address] [place] [address] [phone] Referral in the system

## 2023-09-04 NOTE — Telephone Encounter (Signed)
 Thank you Shasta have a nice evening!

## 2023-09-24 ENCOUNTER — Telehealth: Payer: Self-pay

## 2023-09-24 NOTE — Telephone Encounter (Signed)
 Copied from CRM [id number]. Topic: General - Other >> Sep 24, 2023 10:00 AM Marda MATSU wrote: Patient Greenman called and stated he will be sending documentation in the next three days over in mychart regarding his BP Stats  Please advise

## 2023-10-23 ENCOUNTER — Encounter: Payer: Self-pay | Admitting: Sports Medicine

## 2023-11-06 ENCOUNTER — Encounter: Payer: Self-pay | Admitting: Dermatology

## 2023-11-06 ENCOUNTER — Ambulatory Visit: Payer: Managed Care, Other (non HMO) | Admitting: Dermatology

## 2023-11-06 VITALS — BP 131/97

## 2023-11-06 DIAGNOSIS — D1801 Hemangioma of skin and subcutaneous tissue: Secondary | ICD-10-CM

## 2023-11-06 DIAGNOSIS — D485 Neoplasm of uncertain behavior of skin: Secondary | ICD-10-CM

## 2023-11-06 DIAGNOSIS — D171 Benign lipomatous neoplasm of skin and subcutaneous tissue of trunk: Secondary | ICD-10-CM

## 2023-11-06 DIAGNOSIS — Z1283 Encounter for screening for malignant neoplasm of skin: Secondary | ICD-10-CM

## 2023-11-06 DIAGNOSIS — L814 Other melanin hyperpigmentation: Secondary | ICD-10-CM

## 2023-11-06 DIAGNOSIS — L578 Other skin changes due to chronic exposure to nonionizing radiation: Secondary | ICD-10-CM

## 2023-11-06 DIAGNOSIS — L821 Other seborrheic keratosis: Secondary | ICD-10-CM

## 2023-11-06 DIAGNOSIS — W908XXA Exposure to other nonionizing radiation, initial encounter: Secondary | ICD-10-CM

## 2023-11-06 DIAGNOSIS — D225 Melanocytic nevi of trunk: Secondary | ICD-10-CM | POA: Diagnosis not present

## 2023-11-06 DIAGNOSIS — D229 Melanocytic nevi, unspecified: Secondary | ICD-10-CM
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
## 2023-11-06 NOTE — Progress Notes (Deleted)
   New Patient Visit   Subjective  Brendan [place] is a 58 y.o. male who presents for a NEW PATIENT appointment to be examined for the concerns as listed below.   ***: Located at the {LOCATION ON ANIB:78048}uyju presented *** ago. The area {Actions; is/is not:16769} bothersome - rating it {NUMBER 1-10:22536} out of 10. {He/She:23295} {HAS HAS WNU:81165} previously been treated for these areas.    ***:  located at the {LOCATION ON ANIB:78048}uyju presented *** ago. The area {Actions; is/is not:16769} bothersome - rating it {NUMBER 1-10:22536} out of 10. {He/She:23295} {HAS HAS WNU:81165} previously been treated for these areas.    Are you nursing, pregnant or trying to conceive? ***  Add on: ***    *** Hx of Bx.  *** family Hx of skin cancer (***).   The following portions of the chart were reviewed this encounter and updated as appropriate: medications, allergies, medical history  Review of Systems:  No other skin or systemic complaints except as noted in HPI or Assessment and Plan.  Objective  Well appearing patient in no apparent distress; mood and affect are within normal limits.  ***A full examination was performed including scalp, head, eyes, ears, nose, lips, neck, chest, axillae, abdomen, back, buttocks, bilateral upper extremities, bilateral lower extremities, hands, feet, fingers, toes, fingernails, and toenails. All findings within normal limits unless otherwise noted below.   ***A focused examination was performed of the following areas:   Relevant exam findings are noted in the Assessment and Plan.    Assessment & Plan       No follow-ups on file.   Documentation: I have reviewed the above documentation for accuracy and completeness, and I agree with the above.  I, Gari Hartsell Maranda, CMA, am acting as scribe for [employer], DO.   Delon Lenis, DO

## 2023-11-06 NOTE — Patient Instructions (Addendum)

## 2023-11-07 LAB — SURGICAL PATHOLOGY

## 2023-11-08 ENCOUNTER — Ambulatory Visit: Payer: Self-pay | Admitting: Dermatology

## 2023-12-27 LAB — OPHTHALMOLOGY REPORT-SCANNED

## 2024-02-07 ENCOUNTER — Other Ambulatory Visit: Payer: Self-pay

## 2024-02-07 ENCOUNTER — Ambulatory Visit
Admission: EM | Admit: 2024-02-07 | Discharge: 2024-02-07 | Disposition: A | Discharge status: Home / Self Care | Attending: Family Medicine | Admitting: Family Medicine

## 2024-02-07 DIAGNOSIS — M62830 Muscle spasm of back: Secondary | ICD-10-CM | POA: Diagnosis not present

## 2024-02-07 MED ORDER — METHYLPREDNISOLONE 4 MG PO TBPK: ORAL_TABLET | ORAL | 0 refills | Status: AC

## 2024-02-07 MED ORDER — METHOCARBAMOL 750 MG PO TABS: 750.0000 mg | ORAL_TABLET | Freq: Four times a day (QID) | ORAL | 0 refills | Status: AC | PRN

## 2024-02-07 MED ADMIN — Ketorolac Tromethamine Inj 30 MG/ML: 60 mg | INTRAMUSCULAR | @ 17:00:00 | NDC 68462075625

## 2024-02-07 NOTE — ED Triage Notes (Addendum)
 Has c/o right lower back pain. 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 Was also told he has a swollen disc in that location. Has had pain x 2.5 days. Wearing back brace. Using capsaicin patch, took 2 650mg  tylenol  a few hours ago.

## 2024-02-07 NOTE — Discharge Instructions (Signed)
 Take the medrol  dosepak as directed May take all of day one tod ay to get started ( six tablets) Take robaxin  as needed muscle relaxer This is good at bedtime Try ice or heat to area Follow up with Dr Alvia

## 2024-02-07 NOTE — ED Provider Notes (Signed)
 [facility]    CSN: [id number] Arrival date & time: 02/07/24  1621      History   Chief Complaint Chief Complaint  Patient presents with   Back Pain    HPI Brendan [place] is a 58 y.o. male.   HPI  Patient is having pain on the right side of his back.  It is severe.  He has tried Exer strength Tylenol , capsaicin patch, and a pain rub on the area.  He is wearing a brace.  He still very stiff and sore.  Its been present for couple of days.  Is not getting better.  He has a long history of back problems.  Has had back pain and sciatica on multiple occasions.  He has had x-rays done.  He has been unable to complete an MRI because of claustrophobia and anxiety.  He still wishes to have this done.  I have advised him to take this up with his primary care doctor.  Past Medical History:  Diagnosis Date   Acute left lumbar radiculopathy 01/30/2019   ADHD    Back pain    Back pain    Cervico-occipital neuralgia of left side 07/08/2015   Chest discomfort 03/01/2018   Constipation    Constipation    Depression    DVT (deep venous thrombosis) (HCC)    Environmental allergies 02/18/2019   Facial flushing 03/01/2018   GAD (generalized anxiety disorder) 02/17/2015   Genital warts    GERD (gastroesophageal reflux disease)    HTN (hypertension) 09/11/2014   Hypertension    Joint pain    Joint pain    Lumbar degenerative disc disease 01/30/2019   Nasal septal deviation 10/06/2016   Obese 09/23/2015   Panic attacks 02/20/2019   Seborrheic dermatitis 07/02/2015   Sinusitis, chronic 09/11/2014   Situational anxiety 09/11/2014   Sleep apnea    Snores 09/11/2014   Swelling of lower extremity     Patient Active Problem List   Diagnosis Date Noted   Vitreous floaters of left eye 09/03/2023   Vision changes 09/03/2023   Elevated blood pressure reading 09/03/2023   BMI 39.0-39.9,adult 08/15/2023   Obesity, Beginning BMI 39.8 08/15/2023   Other fatigue 08/15/2023   SOBOE  (shortness of breath on exertion) 08/15/2023   Hyperglycemia 08/15/2023   Abnormal craving 03/01/2023   Suspected sleep apnea 03/01/2023   Polycythemia 03/01/2023   Cough 01/11/2022   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   Depression screening 04/03/2021   Neoplasm of uncertain behavior of skin 04/03/2021   Left ankle pain 09/22/2019   Panic attacks 02/20/2019   Environmental allergies 02/18/2019   DDD (degenerative disc disease), thoracolumbar 01/30/2019   Nasal septal deviation 10/06/2016   Class 2 obesity without serious comorbidity with body mass index (BMI) of 38.0 to 38.9 in adult 09/23/2015   GAD (generalized anxiety disorder) 02/17/2015   HTN (hypertension) 09/11/2014   Snores 09/11/2014   Situational anxiety 09/11/2014   Sinusitis, chronic 09/11/2014   GERD (gastroesophageal reflux disease) 09/11/2014    Past Surgical History:  Procedure Laterality Date   TONSILLECTOMY     WISDOM TOOTH EXTRACTION         Home Medications    Prior to Admission medications  Medication Sig Start Date End Date Taking? Authorizing Provider  methocarbamol  (ROBAXIN ) 750 MG tablet Take 1 tablet (750 mg total) by mouth every 6 (six) hours as needed for muscle spasms. 02/07/24  Yes Maranda Jamee Jacob, MD  methylPREDNISolone  (MEDROL  DOSEPAK) 4 MG TBPK tablet  tad 02/07/24  Yes Maranda Jamee Jacob, MD  Calcium  Carbonate Antacid (TUMS PO) Take by mouth daily as needed.     [provider]  Homeopathic Products (ARNICARE) GEL  12/26/21   [provider]  hydrOXYzine  (ATARAX /VISTARIL ) 25 MG tablet TAKE 1 TABLET(25 MG) BY MOUTH EVERY 8 HOURS AS NEEDED FOR ANXIETY 02/05/20   Alexander, Natalie, DO  loratadine (CLARITIN) 10 MG tablet Take 10 mg by mouth daily as needed.     [provider]  metoprolol  succinate (TOPROL -XL) 50 MG 24 hr tablet TAKE 1 TABLET(50 MG) BY MOUTH DAILY WITH OR IMMEDIATELY FOLLOWING A MEAL 03/27/23   Alvia Bring, DO  Multiple Vitamin (MULTIVITAMIN) tablet  Take 1 tablet by mouth daily.    [provider]  olmesartan  (BENICAR ) 5 MG tablet TAKE 1 TABLET(5 MG) BY MOUTH DAILY 03/27/23   Alvia Bring, DO  rizatriptan  (MAXALT -MLT) 5 MG disintegrating tablet DISSOLVE 1 TABLET ON THE TONGUE AT ONSET OF HEADACHE. MAY REPEAT IN 2 HOURS AS NEEDED OR AS DIRECTED 11/04/21   Alvia Bring, DO  SALINE NASAL SPRAY NA Place into the nose.    [provider]  Simethicone (GAS-X PO) Take by mouth as needed.    [provider]  sodium chloride (OCEAN) 0.65 % SOLN nasal spray Place 1 spray into both nostrils as needed for congestion.    [provider]    Family History Family History  Problem Relation Age of Onset   Obesity Mother    Depression Mother    Anxiety disorder Mother    Heart disease Mother    High blood pressure Mother    Hypertension Mother    Hyperlipidemia Mother    Eating disorder Mother    Obesity Father    Anxiety disorder Father    Depression Father    Cancer Father    Heart disease Father    Migraines Father    High blood pressure Father    Hypertension Father    Varicose Veins Father    Sleep apnea Father    Alcohol abuse Brother    Depression Brother    Diabetes Maternal Aunt    High blood pressure Maternal Grandmother    High blood pressure Maternal Grandfather    High blood pressure Paternal Grandmother    Diabetes Paternal Grandfather    High blood pressure Paternal Grandfather     Social History Social History[1]   Allergies   Singulair  [montelukast ] and Sudafed [pseudoephedrine]   Review of Systems Review of Systems See HPI  Physical Exam Triage Vital Signs ED Triage Vitals  Encounter Vitals Group     BP 02/07/24 1643 121/87     Girls Systolic BP Percentile --      Girls Diastolic BP Percentile --      Boys Systolic BP Percentile --      Boys Diastolic BP Percentile --      Pulse Rate 02/07/24 1643 87     Resp 02/07/24 1643 16     Temp 02/07/24 1643 98.9 F (37.2  C)     Temp src --      SpO2 02/07/24 1643 96 %     Weight --      Height --      Head Circumference --      Peak Flow --      Pain Score 02/07/24 1647 6     Pain Loc --      Pain Education --  Exclude from Growth Chart --    No data found.  Updated Vital Signs BP 121/87   Pulse 87   Temp 98.9 F (37.2 C)   Resp 16   SpO2 96%      Physical Exam Constitutional:      General: He is in acute distress.     Appearance: He is well-developed.     Comments: Overweight.  No obvious distress.  Stiff and uncomfortable  HENT:     Head: Normocephalic and atraumatic.  Eyes:     Conjunctiva/sclera: Conjunctivae normal.     Pupils: Pupils are equal, round, and reactive to light.  Cardiovascular:     Rate and Rhythm: Normal rate.  Pulmonary:     Effort: Pulmonary effort is normal. No respiratory distress.  Abdominal:     General: There is no distension.     Palpations: Abdomen is soft.  Musculoskeletal:        General: Tenderness present. No swelling. Normal range of motion.     Cervical back: Normal range of motion.       Back:     Comments: Area of pain.  Right lumbar muscles from the lower rib border almost to the SI region.  No bony tenderness.  Skin:    General: Skin is warm and dry.  Neurological:     General: No focal deficit present.     Mental Status: He is alert.     Comments: Reflexes intact.  Sensation intact.  Strength intact.  Straight leg raise negative      UC Treatments / Results  Labs (all labs ordered are listed, but only abnormal results are displayed) Labs Reviewed - No data to display  EKG   Radiology No results found.  Procedures Procedures (including critical care time)  Medications Ordered in UC Medications  ketorolac  (TORADOL ) 30 MG/ML injection 60 mg (60 mg Intramuscular Given 02/07/24 1718)    Initial Impression / Assessment and Plan / UC Course  I have reviewed the triage vital signs and the nursing notes.  Pertinent labs  & imaging results that were available during my care of the patient were reviewed by me and considered in my medical decision making (see chart for details).     Final Clinical Impressions(s) / UC Diagnoses   Final diagnoses:  Muscle spasm of back     Discharge Instructions      Take the medrol  dosepak as directed May take all of day one tod ay to get started ( six tablets) Take robaxin  as needed muscle relaxer This is good at bedtime Try ice or heat to area Follow up with Dr Alvia   ED Prescriptions     Medication Sig Dispense Auth. Provider   methocarbamol  (ROBAXIN ) 750 MG tablet Take 1 tablet (750 mg total) by mouth every 6 (six) hours as needed for muscle spasms. 30 tablet Maranda Jamee Jacob, MD   methylPREDNISolone  (MEDROL  DOSEPAK) 4 MG TBPK tablet tad 21 tablet Maranda Jamee Jacob, MD      PDMP not reviewed this encounter.    [1]  Social History Tobacco Use   Smoking status: Never   Smokeless tobacco: Never  Vaping Use   Vaping status: Never Used  Substance Use Topics   Alcohol use: No   Drug use: No     Maranda Jamee Jacob, MD 02/07/24 [phone]

## 2024-02-11 ENCOUNTER — Ambulatory Visit: Admitting: Family Medicine

## 2024-02-11 ENCOUNTER — Encounter: Payer: Self-pay | Admitting: Family Medicine

## 2024-02-11 VITALS — BP 133/82 | HR 67 | Ht 72.0 in | Wt 290.0 lb

## 2024-02-11 DIAGNOSIS — R140 Abdominal distension (gaseous): Secondary | ICD-10-CM | POA: Diagnosis not present

## 2024-02-11 DIAGNOSIS — M545 Low back pain, unspecified: Secondary | ICD-10-CM | POA: Insufficient documentation

## 2024-02-11 DIAGNOSIS — G8929 Other chronic pain: Secondary | ICD-10-CM | POA: Diagnosis not present

## 2024-02-11 NOTE — Assessment & Plan Note (Signed)
 Intolerant to PT.  Continues to have back pain greater than 1 year.  MRI lumbar spine ordered.

## 2024-02-11 NOTE — Assessment & Plan Note (Signed)
 Orders Placed This Encounter  Procedures   CMP14+EGFR   CBC with Differential/Platelet   TSH   Lipase   Amylase   Celiac Disease Panel  Possible diet related.  I would recommend that he have colon cancer screening as well.  He will consider this.

## 2024-02-11 NOTE — Progress Notes (Signed)
 " Brendan [place] - 58 y.o. male MRN [id number]  Date of birth: Nov 14, 1965  Subjective Chief Complaint  Patient presents with   Back Pain    MRI referral    HPI Brendan [place] is a 58 y.o. male here today for follow up of back pain.  Last seen in clinic >1 year ago.  History of chronic low back pain.  Seen by orthopedic provider and diagnosed with slipping rib syndrome and lumbar spondylosis.SABRA  MRI recommended however he could not tolerate.  Unable to tolerate PT previously.  We had discussed getting MRI however he did not feel that he could tolerate a traditional MRI even with sedation.  Discussed availability of upright and/or open MRI in [place] or [place] but he initially declined.  Most recently seen at [facility] for back [facility].  Pain located in the mid thoracic spine with radiation into the lower spine.  Also has pain in lower back with radiation into the upper buttock.  No bowel or bladder incontinence.  No lower extremity weakness.  Reports chronic abdominal bloating and gas.  Reports that every time he eats something he has bloating and gas.  He has never had colon cancer screening.  Feels like bowels are moving normally.  Does get some occasional nausea.  Has not noted any blood in his stool.  No red flags such as weight loss.  ROS:  A comprehensive ROS was completed and negative except as noted per HPI  Allergies[1]  Past Medical History:  Diagnosis Date   Acute left lumbar radiculopathy 01/30/2019   ADHD    Back pain    Back pain    Cervico-occipital neuralgia of left side 07/08/2015   Chest discomfort 03/01/2018   Constipation    Constipation    Depression    DVT (deep venous thrombosis) (HCC)    Environmental allergies 02/18/2019   Facial flushing 03/01/2018   GAD (generalized anxiety disorder) 02/17/2015   Genital warts    GERD (gastroesophageal reflux disease)    HTN (hypertension) 09/11/2014   Hypertension    Joint pain    Joint pain    Lumbar degenerative disc  disease 01/30/2019   Nasal septal deviation 10/06/2016   Obese 09/23/2015   Panic attacks 02/20/2019   Seborrheic dermatitis 07/02/2015   Sinusitis, chronic 09/11/2014   Situational anxiety 09/11/2014   Sleep apnea    Snores 09/11/2014   Swelling of lower extremity     Past Surgical History:  Procedure Laterality Date   TONSILLECTOMY     WISDOM TOOTH EXTRACTION      Social History   Socioeconomic History   Marital status: Married    Spouse name: Not on file   Number of children: Not on file   Years of education: Not on file   Highest education level: Master's degree (e.g., MA, MS, MEng, MEd, MSW, MBA)  Occupational History   Not on file  Tobacco Use   Smoking status: Never   Smokeless tobacco: Never  Vaping Use   Vaping status: Never Used  Substance and Sexual Activity   Alcohol use: No   Drug use: No   Sexual activity: Yes  Other Topics Concern   Not on file  Social History Narrative   Not on file   Social Drivers of Health   Tobacco Use: Low Risk (02/11/2024)   Patient History    Smoking Tobacco Use: Never    Smokeless Tobacco Use: Never    Passive Exposure: Not on file  Financial  Resource Strain: Low Risk (02/10/2024)   Overall Financial Resource Strain (CARDIA)    Difficulty of Paying Living Expenses: Not hard at all  Food Insecurity: No Food Insecurity (02/10/2024)   Epic    Worried About Programme Researcher, Broadcasting/film/video in the Last Year: Never true    Ran Out of Food in the Last Year: Never true  Transportation Needs: No Transportation Needs (02/10/2024)   Epic    Lack of Transportation (Medical): No    Lack of Transportation (Non-Medical): No  Physical Activity: Inactive (02/10/2024)   Exercise Vital Sign    Days of Exercise per Week: 0 days    Minutes of Exercise per Session: Not on file  Stress: Stress Concern Present (02/10/2024)   [employer] of Occupational Health - Occupational Stress Questionnaire    Feeling of Stress: Rather much  Social  Connections: Moderately Isolated (02/10/2024)   Social Connection and Isolation Panel    Frequency of Communication with Friends and Family: More than three times a week    Frequency of Social Gatherings with Friends and Family: More than three times a week    Attends Religious Services: Never    Database Administrator or Organizations: No    Attends Engineer, Structural: Not on file    Marital Status: Married  Depression (PHQ2-9): Medium Risk (02/11/2024)   Depression (PHQ2-9)    PHQ-2 Score: 6  Alcohol Screen: Not on file  Housing: Low Risk (02/10/2024)   Epic    Unable to Pay for Housing in the Last Year: No    Number of Times Moved in the Last Year: 0    Homeless in the Last Year: No  Utilities: Not on file  Health Literacy: Not on file    Family History  Problem Relation Age of Onset   Obesity Mother    Depression Mother    Anxiety disorder Mother    Heart disease Mother    High blood pressure Mother    Hypertension Mother    Hyperlipidemia Mother    Eating disorder Mother    Obesity Father    Anxiety disorder Father    Depression Father    Cancer Father    Heart disease Father    Migraines Father    High blood pressure Father    Hypertension Father    Varicose Veins Father    Sleep apnea Father    Alcohol abuse Brother    Depression Brother    Diabetes Maternal Aunt    High blood pressure Maternal Grandmother    High blood pressure Maternal Grandfather    High blood pressure Paternal Grandmother    Diabetes Paternal Grandfather    High blood pressure Paternal Grandfather     Health Maintenance  Topic Date Due   HIV Screening  Never done   Hepatitis C Screening  Never done   Hepatitis B Vaccines 19-59 Average Risk (1 of 3 - 19+ 3-dose series) Never done   Zoster Vaccines- Shingrix (1 of 2) Never done   Colonoscopy  Never done   Pneumococcal Vaccine: 50+ Years (1 of 1 - PCV) Never done   Influenza Vaccine  09/21/2023   COVID-19 Vaccine (4 -  2025-26 season) 10/22/2023   DTaP/Tdap/Td (2 - Td or Tdap) 09/22/2025   HPV VACCINES  Aged Out   Meningococcal B Vaccine  Aged Out     ----------------------------------------------------------------------------------------------------------------------------------------------------------------------------------------------------------------- Physical Exam BP 133/82 (BP Location: Left Arm, Patient Position: Sitting, Cuff Size: Normal)   Pulse 67  Ht 6' (1.829 m)   Wt 290 lb (131.5 kg)   SpO2 98%   BMI 39.33 kg/m   Physical Exam Constitutional:      Appearance: Normal appearance.  HENT:     Head: Normocephalic and atraumatic.  Eyes:     General: No scleral icterus. Cardiovascular:     Rate and Rhythm: Normal rate and regular rhythm.  Pulmonary:     Effort: Pulmonary effort is normal.     Breath sounds: Normal breath sounds.  Musculoskeletal:     Cervical back: Neck supple.  Neurological:     Mental Status: He is alert.  Psychiatric:        Mood and Affect: Mood normal.        Behavior: Behavior normal.     ------------------------------------------------------------------------------------------------------------------------------------------------------------------------------------------------------------------- Assessment and Plan  Chronic low back pain Intolerant to PT.  Continues to have back pain greater than 1 year.  MRI lumbar spine ordered.  Abdominal bloating Orders Placed This Encounter  Procedures   CMP14+EGFR   CBC with Differential/Platelet   TSH   Lipase   Amylase   Celiac Disease Panel  Possible diet related.  I would recommend that he have colon cancer screening as well.  He will consider this.   No orders of the defined types were placed in this encounter.   No follow-ups on file.         [1]  Allergies Allergen Reactions   Singulair  [Montelukast ]     Feels weird    Sudafed [Pseudoephedrine]     Heart racing.    "

## 2024-02-11 NOTE — Patient Instructions (Signed)
 Abdominal Bloating: What to Know  Belly bloating is when your belly may feel full, tight, or painful. It may also look bigger or swollen. This is also called abdominal bloating. Common causes of belly bloating include: Swallowing air. Trouble pooping (constipation). Problems digesting food. Lactose intolerance. This means you can't digest lactose normally, a natural sugar in dairy. Celiac disease. This means you can't digest gluten, a protein found in wheat). Slow movement of food in the stomach and small intestine. Eating too much. Irritable bowel syndrome (IBS). This affects the large intestine. Gastroesophageal reflux disease (GERD). This is when stomach acid goes back into the throat. Urinary retention. This is when the bladder can't be emptied all the way. Follow these instructions at home: Eating and drinking Avoid eating too much. Try not to swallow air while talking or eating. Avoid eating while lying down. Avoid foods and drinks that may trigger your bloating. Depending on the cause this may include: Foods that cause gas, such as broccoli, cabbage, cauliflower, and baked beans. Fizzy, or carbonated, drinks. Hard candy. Chewing gum. Dairy. Medicines Take your medicines only as told. Ask your provider if you should take a probiotic. Probiotics have good bacteria or yeast that can help with digestion. General instructions Exercise regularly to help with bloating from gas and any trouble with pooping. You may need to take steps to help treat or prevent trouble pooping, such as: Taking medicines to help you poop. Eating foods high in fiber, like beans, whole grains, and fresh fruits and vegetables. Drinking more fluids as told. Contact a health care provider if: You throw up or feel like you may throw up. You have watery poop, or diarrhea, that doesn't stop. You have belly pain that gets worse and medicines don't help. You are gaining or losing weight unexpectedly. Get help  right away if: You have chest pain. You have trouble breathing. You feel short of breath. You have trouble peeing. You have dark pee or pee that smells bad. You have blood in your poop or dark, tarry poop. These symptoms may be an emergency. Call 911 right away. Do not wait to see if the symptoms will go away. Do not drive yourself to the hospital. This information is not intended to replace advice given to you by your health care provider. Make sure you discuss any questions you have with your health care provider. Document Revised: 09/27/2022 Document Reviewed: 09/27/2022 Elsevier Patient Education  2024 [employer].

## 2024-02-18 DIAGNOSIS — M545 Low back pain, unspecified: Secondary | ICD-10-CM

## 2024-02-25 NOTE — Telephone Encounter (Signed)
"  Referral pended for review.   "

## 2024-02-28 NOTE — Telephone Encounter (Signed)
 Thank you for your assistance and reaching out to the patient. Have a great day.

## 2024-03-26 ENCOUNTER — Other Ambulatory Visit: Payer: Self-pay

## 2024-03-26 DIAGNOSIS — I10 Essential (primary) hypertension: Secondary | ICD-10-CM

## 2024-03-26 MED ORDER — OLMESARTAN MEDOXOMIL 5 MG PO TABS: ORAL_TABLET | ORAL | 1 refills | Status: DC

## 2024-03-26 MED ORDER — METOPROLOL SUCCINATE ER 50 MG PO TB24: ORAL_TABLET | ORAL | 1 refills | Status: DC

## 2024-03-27 ENCOUNTER — Telehealth: Payer: Self-pay

## 2024-03-27 DIAGNOSIS — I10 Essential (primary) hypertension: Secondary | ICD-10-CM

## 2024-03-27 MED ORDER — OLMESARTAN MEDOXOMIL 5 MG PO TABS: ORAL_TABLET | ORAL | 1 refills | Status: AC

## 2024-03-27 MED ORDER — METOPROLOL SUCCINATE ER 50 MG PO TB24: ORAL_TABLET | ORAL | 1 refills | Status: AC

## 2024-03-27 NOTE — Telephone Encounter (Signed)
 Copied from CRM [id number]. Topic: Clinical - Prescription Issue >> Mar 26, 2024  5:34 PM [name] wrote: Reason for CRM: pt is in the hosp with father please resend these medicines to this pharmacy.   metoprolol  succinate (TOPROL -XL) 50 MG 24 hr tablet olmesartan  (BENICAR ) 5 MG tablet  Pharmacy: [id number] - [place], [place] - [address]  Phone: [phone] Fax: [phone]    ----------------------------------------------------------------------- From previous Reason for Contact - Medication Refill: Medication:   Has the patient contacted their pharmacy?   (Agent: If no, request that the patient contact the pharmacy for the refill. If patient does not wish to contact the pharmacy document the reason why and proceed with request.) (Agent: If yes, when and what did the pharmacy advise?)  This is the patient's preferred pharmacy:  [facility] #[id number] - [place], [place] - [address] [place] [address] Phone: [phone] Fax: [phone]  [facility] #[id number] - [place], [place] - [address] [place] [address] Phone: [phone] Fax: [phone]  Is this the correct pharmacy for this prescription?   If no, delete pharmacy and type the correct one.   Has the prescription been filled recently?    Is the patient out of the medication?    Has the patient been seen for an appointment in the last year OR does the patient have an upcoming appointment?    Can we respond through MyChart?    Agent: Please be advised that Rx refills may take up to 3 business days. We ask that you follow-up with your pharmacy.

## 2024-03-27 NOTE — Telephone Encounter (Unsigned)
 Copied from CRM [id number]. Topic: Clinical - Medication Refill >> Mar 27, 2024 10:28 AM Gattis SQUIBB wrote: Medication: Olmesartan  5 mg and metoprolol  50 mg 24 hr  Has the patient contacted their pharmacy? Yes (Agent: If no, request that the patient contact the pharmacy for the refill. If patient does not wish to contact the pharmacy document the reason why and proceed with request.) (Agent: If yes, when and what did the pharmacy advise?)  This is the patient's preferred pharmacy:  [id number] - [place], [place] - [address] [place] [address] Phone: [phone] Fax: [phone]  Is this the correct pharmacy for this prescription? Yes If no, delete pharmacy and type the correct one.   Has the prescription been filled recently? Yes  Is the patient out of the medication? No  Has the patient been seen for an appointment in the last year OR does the patient have an upcoming appointment? Yes  Can we respond through MyChart? Yes  Agent: Please be advised that Rx refills may take up to 3 business days. We ask that you follow-up with your pharmacy.

## 2024-11-06 ENCOUNTER — Ambulatory Visit: Admitting: Dermatology
